# Patient Record
Sex: Male | Born: 1945 | Race: Black or African American | Hispanic: No | Marital: Married | Smoking: Former smoker
Health system: Southern US, Community
[De-identification: ages and names within clinical notes are randomized; demographics above are authoritative.]

## PROBLEM LIST (undated history)

## (undated) DIAGNOSIS — G709 Myoneural disorder, unspecified: Secondary | ICD-10-CM

## (undated) DIAGNOSIS — K5900 Constipation, unspecified: Secondary | ICD-10-CM

## (undated) DIAGNOSIS — Z860100 Personal history of colon polyps, unspecified: Secondary | ICD-10-CM

## (undated) DIAGNOSIS — K219 Gastro-esophageal reflux disease without esophagitis: Secondary | ICD-10-CM

## (undated) DIAGNOSIS — E7800 Pure hypercholesterolemia, unspecified: Secondary | ICD-10-CM

## (undated) DIAGNOSIS — K2270 Barrett's esophagus without dysplasia: Secondary | ICD-10-CM

## (undated) DIAGNOSIS — M1990 Unspecified osteoarthritis, unspecified site: Secondary | ICD-10-CM

## (undated) DIAGNOSIS — D649 Anemia, unspecified: Secondary | ICD-10-CM

## (undated) DIAGNOSIS — E039 Hypothyroidism, unspecified: Secondary | ICD-10-CM

## (undated) DIAGNOSIS — I10 Essential (primary) hypertension: Secondary | ICD-10-CM

## (undated) DIAGNOSIS — Z8489 Family history of other specified conditions: Secondary | ICD-10-CM

## (undated) DIAGNOSIS — J449 Chronic obstructive pulmonary disease, unspecified: Secondary | ICD-10-CM

## (undated) DIAGNOSIS — Z8719 Personal history of other diseases of the digestive system: Secondary | ICD-10-CM

## (undated) DIAGNOSIS — Z5189 Encounter for other specified aftercare: Secondary | ICD-10-CM

## (undated) HISTORY — PX: FOOT SURGERY: SHX648

## (undated) HISTORY — DX: Personal history of colon polyps, unspecified: Z86.0100

## (undated) HISTORY — PX: COLONOSCOPY: SHX5424

## (undated) HISTORY — PX: UPPER GASTROINTESTINAL ENDOSCOPY: SHX188

## (undated) HISTORY — DX: Encounter for other specified aftercare: Z51.89

## (undated) HISTORY — DX: Barrett's esophagus without dysplasia: K22.70

## (undated) HISTORY — DX: Anemia, unspecified: D64.9

## (undated) HISTORY — DX: Personal history of colonic polyps: Z86.010

## (undated) HISTORY — DX: Unspecified osteoarthritis, unspecified site: M19.90

## (undated) HISTORY — PX: ANTERIOR CERVICAL DECOMP/DISCECTOMY FUSION: SHX1161

## (undated) HISTORY — DX: Essential (primary) hypertension: I10

---

## 1983-04-27 HISTORY — PX: LUMBAR DISC SURGERY: SHX700

## 1999-09-03 ENCOUNTER — Encounter (INDEPENDENT_AMBULATORY_CARE_PROVIDER_SITE_OTHER): Payer: Self-pay | Admitting: Specialist

## 1999-09-03 ENCOUNTER — Other Ambulatory Visit: Admission: RE | Admit: 1999-09-03 | Discharge: 1999-09-03 | Payer: Self-pay | Admitting: Gastroenterology

## 1999-12-22 ENCOUNTER — Encounter: Payer: Self-pay | Admitting: Neurological Surgery

## 1999-12-22 ENCOUNTER — Ambulatory Visit (HOSPITAL_COMMUNITY): Admission: RE | Admit: 1999-12-22 | Discharge: 1999-12-22 | Payer: Self-pay | Admitting: Neurological Surgery

## 2000-12-26 ENCOUNTER — Emergency Department (HOSPITAL_COMMUNITY): Admission: EM | Admit: 2000-12-26 | Discharge: 2000-12-27 | Payer: Self-pay

## 2000-12-27 ENCOUNTER — Encounter: Payer: Self-pay | Admitting: Emergency Medicine

## 2001-01-30 ENCOUNTER — Ambulatory Visit (HOSPITAL_COMMUNITY): Admission: RE | Admit: 2001-01-30 | Discharge: 2001-01-30 | Payer: Self-pay | Admitting: *Deleted

## 2001-01-30 ENCOUNTER — Encounter (INDEPENDENT_AMBULATORY_CARE_PROVIDER_SITE_OTHER): Payer: Self-pay

## 2001-11-23 ENCOUNTER — Ambulatory Visit (HOSPITAL_COMMUNITY): Admission: RE | Admit: 2001-11-23 | Discharge: 2001-11-23 | Payer: Self-pay | Admitting: Cardiology

## 2001-11-23 ENCOUNTER — Encounter: Payer: Self-pay | Admitting: Cardiology

## 2002-07-31 ENCOUNTER — Ambulatory Visit (HOSPITAL_COMMUNITY): Admission: RE | Admit: 2002-07-31 | Discharge: 2002-07-31 | Payer: Self-pay | Admitting: *Deleted

## 2002-07-31 ENCOUNTER — Encounter (INDEPENDENT_AMBULATORY_CARE_PROVIDER_SITE_OTHER): Payer: Self-pay | Admitting: Specialist

## 2004-07-21 ENCOUNTER — Ambulatory Visit (HOSPITAL_COMMUNITY): Admission: RE | Admit: 2004-07-21 | Discharge: 2004-07-21 | Payer: Self-pay | Admitting: *Deleted

## 2004-07-21 ENCOUNTER — Encounter (INDEPENDENT_AMBULATORY_CARE_PROVIDER_SITE_OTHER): Payer: Self-pay | Admitting: *Deleted

## 2005-03-17 ENCOUNTER — Ambulatory Visit: Payer: Self-pay | Admitting: Internal Medicine

## 2005-06-18 ENCOUNTER — Ambulatory Visit: Payer: Self-pay | Admitting: Internal Medicine

## 2006-04-29 ENCOUNTER — Encounter (INDEPENDENT_AMBULATORY_CARE_PROVIDER_SITE_OTHER): Payer: Self-pay | Admitting: *Deleted

## 2006-04-29 ENCOUNTER — Ambulatory Visit: Payer: Self-pay | Admitting: Internal Medicine

## 2006-04-29 LAB — CONVERTED CEMR LAB
AST: 33 units/L (ref 0–37)
BUN: 8 mg/dL (ref 6–23)

## 2006-05-03 ENCOUNTER — Encounter: Payer: Self-pay | Admitting: Internal Medicine

## 2006-05-13 ENCOUNTER — Ambulatory Visit: Payer: Self-pay | Admitting: Internal Medicine

## 2006-07-28 ENCOUNTER — Ambulatory Visit: Payer: Self-pay | Admitting: Internal Medicine

## 2006-07-28 ENCOUNTER — Encounter: Admission: RE | Admit: 2006-07-28 | Discharge: 2006-07-28 | Payer: Self-pay | Admitting: Internal Medicine

## 2006-08-03 ENCOUNTER — Ambulatory Visit: Payer: Self-pay | Admitting: Internal Medicine

## 2006-08-15 ENCOUNTER — Ambulatory Visit: Payer: Self-pay | Admitting: Internal Medicine

## 2006-08-22 ENCOUNTER — Ambulatory Visit: Payer: Self-pay | Admitting: Internal Medicine

## 2006-10-13 ENCOUNTER — Encounter: Payer: Self-pay | Admitting: Internal Medicine

## 2007-05-22 ENCOUNTER — Encounter (INDEPENDENT_AMBULATORY_CARE_PROVIDER_SITE_OTHER): Payer: Self-pay | Admitting: *Deleted

## 2007-05-22 DIAGNOSIS — J00 Acute nasopharyngitis [common cold]: Secondary | ICD-10-CM | POA: Insufficient documentation

## 2007-05-22 DIAGNOSIS — E785 Hyperlipidemia, unspecified: Secondary | ICD-10-CM | POA: Insufficient documentation

## 2007-05-22 DIAGNOSIS — K2270 Barrett's esophagus without dysplasia: Secondary | ICD-10-CM | POA: Insufficient documentation

## 2007-10-19 ENCOUNTER — Ambulatory Visit: Payer: Self-pay | Admitting: Internal Medicine

## 2007-10-19 DIAGNOSIS — R3911 Hesitancy of micturition: Secondary | ICD-10-CM | POA: Insufficient documentation

## 2007-10-19 DIAGNOSIS — I10 Essential (primary) hypertension: Secondary | ICD-10-CM | POA: Insufficient documentation

## 2007-10-21 LAB — CONVERTED CEMR LAB
HDL: 44.3 mg/dL (ref >39.0)
Hgb A1c MFr Bld: 5.7 % (ref 4.6–6.0)
Triglycerides: 109 mg/dL (ref 0–149)

## 2007-10-25 ENCOUNTER — Encounter (INDEPENDENT_AMBULATORY_CARE_PROVIDER_SITE_OTHER): Payer: Self-pay | Admitting: *Deleted

## 2007-10-31 ENCOUNTER — Encounter (INDEPENDENT_AMBULATORY_CARE_PROVIDER_SITE_OTHER): Payer: Self-pay | Admitting: *Deleted

## 2007-11-01 ENCOUNTER — Encounter (INDEPENDENT_AMBULATORY_CARE_PROVIDER_SITE_OTHER): Payer: Self-pay | Admitting: *Deleted

## 2007-11-16 ENCOUNTER — Encounter: Payer: Self-pay | Admitting: Internal Medicine

## 2007-12-05 ENCOUNTER — Encounter: Admission: RE | Admit: 2007-12-05 | Discharge: 2007-12-05 | Payer: Self-pay | Admitting: Orthopaedic Surgery

## 2007-12-14 ENCOUNTER — Encounter (INDEPENDENT_AMBULATORY_CARE_PROVIDER_SITE_OTHER): Payer: Self-pay | Admitting: *Deleted

## 2007-12-14 ENCOUNTER — Ambulatory Visit (HOSPITAL_COMMUNITY): Admission: RE | Admit: 2007-12-14 | Discharge: 2007-12-14 | Payer: Self-pay | Admitting: *Deleted

## 2008-01-09 ENCOUNTER — Encounter: Payer: Self-pay | Admitting: Internal Medicine

## 2008-09-27 ENCOUNTER — Telehealth (INDEPENDENT_AMBULATORY_CARE_PROVIDER_SITE_OTHER): Payer: Self-pay | Admitting: *Deleted

## 2008-10-01 ENCOUNTER — Ambulatory Visit: Payer: Self-pay | Admitting: Internal Medicine

## 2008-10-01 DIAGNOSIS — D126 Benign neoplasm of colon, unspecified: Secondary | ICD-10-CM | POA: Insufficient documentation

## 2008-10-02 ENCOUNTER — Encounter: Payer: Self-pay | Admitting: Internal Medicine

## 2008-10-03 ENCOUNTER — Ambulatory Visit: Payer: Self-pay | Admitting: Internal Medicine

## 2008-10-04 ENCOUNTER — Telehealth (INDEPENDENT_AMBULATORY_CARE_PROVIDER_SITE_OTHER): Payer: Self-pay | Admitting: *Deleted

## 2008-10-04 LAB — CONVERTED CEMR LAB
Albumin: 3.6 g/dL (ref 3.5–5.2)
BUN: 14 mg/dL (ref 6–23)
Basophils Absolute: 0 10*3/uL (ref 0.0–0.1)
Cholesterol: 170 mg/dL (ref 0–200)
Creatinine, Ser: 1.1 mg/dL (ref 0.4–1.5)
Eosinophils Relative: 3.6 % (ref 0.0–5.0)
HCT: 42.9 % (ref 39.0–52.0)
Hemoglobin: 14.6 g/dL (ref 13.0–17.0)
LDL Cholesterol: 107 mg/dL — ABNORMAL HIGH (ref 0–99)
Lymphs Abs: 1.9 10*3/uL (ref 0.7–4.0)
MCV: 94.3 fL (ref 78.0–100.0)
Monocytes Absolute: 0.6 10*3/uL (ref 0.1–1.0)
Monocytes Relative: 11.5 % (ref 3.0–12.0)
Neutro Abs: 2.5 10*3/uL (ref 1.4–7.7)
PSA: 2.1 ng/mL (ref 0.10–4.00)
Platelets: 158 10*3/uL (ref 150.0–400.0)
Potassium: 4 meq/L (ref 3.5–5.1)
RDW: 13.3 % (ref 11.5–14.6)
Total Bilirubin: 0.8 mg/dL (ref 0.3–1.2)
Triglycerides: 95 mg/dL (ref 0.0–149.0)
VLDL: 19 mg/dL (ref 0.0–40.0)

## 2008-10-07 ENCOUNTER — Encounter (INDEPENDENT_AMBULATORY_CARE_PROVIDER_SITE_OTHER): Payer: Self-pay | Admitting: *Deleted

## 2009-03-13 ENCOUNTER — Ambulatory Visit: Payer: Self-pay | Admitting: Internal Medicine

## 2009-03-17 ENCOUNTER — Encounter: Payer: Self-pay | Admitting: Internal Medicine

## 2009-11-07 ENCOUNTER — Ambulatory Visit: Payer: Self-pay | Admitting: Internal Medicine

## 2009-11-10 LAB — CONVERTED CEMR LAB
Basophils Absolute: 0.1 10*3/uL (ref 0.0–0.1)
CO2: 31 meq/L (ref 19–32)
Calcium: 9.1 mg/dL (ref 8.4–10.5)
Creatinine, Ser: 0.9 mg/dL (ref 0.4–1.5)
Eosinophils Absolute: 0.2 10*3/uL (ref 0.0–0.7)
GFR calc non Af Amer: 103.92 mL/min (ref >60)
HCT: 44.8 % (ref 39.0–52.0)
Hemoglobin: 15.1 g/dL (ref 13.0–17.0)
Lymphs Abs: 1.6 10*3/uL (ref 0.7–4.0)
MCHC: 33.7 g/dL (ref 30.0–36.0)
Monocytes Relative: 10.3 % (ref 3.0–12.0)
Neutro Abs: 3.4 10*3/uL (ref 1.4–7.7)
Platelets: 188 10*3/uL (ref 150.0–400.0)
RDW: 14.9 % — ABNORMAL HIGH (ref 11.5–14.6)
Sodium: 137 meq/L (ref 135–145)

## 2009-11-20 LAB — CONVERTED CEMR LAB: Hgb A1c MFr Bld: 5.7 % (ref 4.6–6.5)

## 2010-05-24 LAB — CONVERTED CEMR LAB
ALT: 18 units/L (ref 0–53)
AST: 31 units/L (ref 0–37)
Albumin: 3.8 g/dL (ref 3.5–5.2)
Alkaline Phosphatase: 48 units/L (ref 39–117)
BUN: 11 mg/dL (ref 6–23)
Basophils Absolute: 0 10*3/uL (ref 0.0–0.1)
Basophils Relative: 0.4 % (ref 0.0–1.0)
Bilirubin, Direct: 0.2 mg/dL (ref 0.0–0.3)
CO2: 29 meq/L (ref 19–32)
Calcium: 9.4 mg/dL (ref 8.4–10.5)
Chloride: 103 meq/L (ref 96–112)
Cholesterol: 171 mg/dL (ref 0–200)
Creatinine, Ser: 1 mg/dL (ref 0.4–1.5)
Eosinophils Absolute: 0.1 10*3/uL (ref 0.0–0.7)
Eosinophils Relative: 2 % (ref 0.0–5.0)
GFR calc Af Amer: 96 mL/min
GFR calc non Af Amer: 80 mL/min
Glucose, Bld: 102 mg/dL — ABNORMAL HIGH (ref 70–99)
HCT: 44.3 % (ref 39.0–52.0)
HDL goal, serum: 40 mg/dL
HDL: 40.7 mg/dL (ref >39.0)
Hemoglobin: 15.3 g/dL (ref 13.0–17.0)
LDL Cholesterol: 106 mg/dL — ABNORMAL HIGH (ref 0–99)
LDL Goal: 90 mg/dL
Lymphocytes Relative: 29.8 % (ref 12.0–46.0)
MCHC: 34.7 g/dL (ref 30.0–36.0)
MCV: 93.5 fL (ref 78.0–100.0)
Monocytes Absolute: 0.3 10*3/uL (ref 0.1–1.0)
Monocytes Relative: 5.5 % (ref 3.0–12.0)
Neutro Abs: 3.3 10*3/uL (ref 1.4–7.7)
Neutrophils Relative %: 62.3 % (ref 43.0–77.0)
PSA: 1.43 ng/mL (ref 0.10–4.00)
Platelets: 194 10*3/uL (ref 150–400)
Potassium: 3.6 meq/L (ref 3.5–5.1)
RBC: 4.73 M/uL (ref 4.22–5.81)
RDW: 13.2 % (ref 11.5–14.6)
Sodium: 141 meq/L (ref 135–145)
TSH: 0.49 microintl units/mL (ref 0.35–5.50)
Total Bilirubin: 1.2 mg/dL (ref 0.3–1.2)
Total CHOL/HDL Ratio: 4.2
Total Protein: 6.9 g/dL (ref 6.0–8.3)
Triglycerides: 120 mg/dL (ref 0–149)
VLDL: 24 mg/dL (ref 0–40)
WBC: 5.3 10*3/uL (ref 4.5–10.5)

## 2010-05-28 NOTE — Assessment & Plan Note (Signed)
Summary: cough//kn   Vital Signs:  Patient profile:   65 year old male Weight:      166.6 pounds BMI:     25.05 Temp:     97.7 degrees F oral Pulse rate:   76 / minute Resp:     15 per minute BP sitting:   120 / 82  (left arm) Cuff size:   large  Vitals Entered By: Shonna Chock CMA (November 07, 2009 10:33 AM) CC: Cough x 3 weeks, yesterday patient noticed yellow drainage Comments REVIEWED MED LIST, PATIENT AGREED DOSE AND INSTRUCTION CORRECT    CC:  Cough x 3 weeks and yesterday patient noticed yellow drainage.  History of Present Illness: Cough      This is a 65 year old man who presents with Cough for 3 months.  The patient reports both dry & productive cough and wheezing, but denies pleuritic chest pain, shortness of breath, exertional dyspnea, fever, hemoptysis, and malaise.  Associated symtpoms include  occasional nasal congestion and  occasional peripheral edema.  The patient denies the following symptoms: cold/URI symptoms, sore throat, chronic rhinitis, weight loss, and acid reflux symptoms.  The cough is worse with activity after he arises.  Ineffective prior treatments have included remainder of Rx  cough medication.  Risk factors include history of allergic rhinitis and history of reflux.    Allergies: 1)  ! Norvasc 2)  ! * Labetolol  Review of Systems General:  Denies chills, fever, and sweats. ENT:  Complains of hoarseness; denies difficulty swallowing; No frontal headache , facial pain or purulence. Resp:  Some yellow sputum. GI:  Denies bloody stools, dark tarry stools, and indigestion. Allergy:  Denies itching eyes and sneezing; Watery eyse.  Physical Exam  General:  Thin but well-nourished,in no acute distress; alert,appropriate and cooperative throughout examination Ears:  External ear exam shows no significant lesions or deformities.  Otoscopic examination reveals clear canals, tympanic membranes are intact bilaterally without bulging, retraction, inflammation  or discharge. Hearing is grossly normal bilaterally. Nose:  External nasal examination shows no deformity or inflammation. Nasal mucosa are pink and moist without lesions or exudates. Mouth:  Oral mucosa and oropharynx without lesions or exudates.  Teeth in good repair. No pharyngeal erythema.   Lungs:  Normal respiratory effort, chest expands symmetrically. Lungs are clear to auscultation, no crackles or wheezes but BS decreased Heart:  Normal rate and regular rhythm. S1 and S2 normal without gallop, murmur, click, rub or other extra sounds. Abdomen:  Bowel sounds positive,abdomen soft and non-tender without masses, organomegaly or hernias noted. Extremities:  No clubbing, cyanosis, edema Cervical Nodes:  No lymphadenopathy noted Axillary Nodes:  No palpable lymphadenopathy Psych:  memory intact for recent and remote, normally interactive, and good eye contact.     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 1 by mouth qd His updated medication list for this problem includes:    Clonidine Hcl 0.3 Mg Tabs (Clonidine hcl) .Marland Kitchen... 1 by  mouth bid    Losartan Potassium-hctz 100-12.5 Mg Tabs (Losartan potassium-hctz) .Marland Kitchen... 1 once daily  Orders: TLB-BMP (Basic Metabolic Panel-BMET) (80048-METABOL)  Complete Medication List: 1)  Clonidine Hcl 0.3 Mg Tabs (Clonidine hcl) .Marland Kitchen.. 1 by mouth bid 2)  Simvastatin  40 Mg Tabs (Simvastatin) .... Not taking 3)  Asa  .Marland Kitchen.. 1 tablet by mouth daily as directed 4)  Vitamin E  .... As directed 5)  Vitamin C  .... As directed 6)  Amoxicillin 500 Mg Caps (Amoxicillin) .Marland Kitchen.. 1 three times a day 7)  Promethazine Vc/codeine 6.25-5-10 Mg/6ml Syrp (Phenyleph-promethazine-cod) .Marland Kitchen.. 1 tsp every 6 hrs as needed 8)  Losartan Potassium-hctz 100-12.5 Mg Tabs (Losartan potassium-hctz) .Marland Kitchen.. 1 once daily  Patient Instructions: 1)  Neti Pot once daily as needed for sinus congestion.Drink as much fluid as you can tolerate for the next few days.Check your Blood Pressure regularly. If it is above: 140/90 ON AVERAGE  you should make an appointment. Prescriptions: LOSARTAN POTASSIUM-HCTZ 100-12.5 MG TABS (LOSARTAN POTASSIUM-HCTZ) 1 once daily  #30 x 5   Entered and Authorized by:   Marga Melnick MD   Signed by:   Marga Melnick MD on 11/07/2009   Method used:   Faxed to ...       [id number] ([facility])       [address]       [place], [place]  [address]       Ph: [phone]       Fax: [phone]   RxID:   [id number] CLONIDINE HCL 0.3 MG  TABS (CLONIDINE HCL) 1 by mouth bid  #180 Tablet x 3   Entered and Authorized by:   Marga Melnick MD   Signed by:   Marga Melnick MD on 11/07/2009   Method used:   Faxed to ...       [id number] ([facility]ail)       [address]       [place], [place]  [address]       Ph: [phone]       Fax: [phone]   RxID:   [id number] PROMETHAZINE VC/CODEINE 6.25-5-10 MG/5ML SYRP (PHENYLEPH-PROMETHAZINE-COD) 1 tsp every 6 hrs as needed  #120cc x 0   Entered and Authorized by:   Marga Melnick MD   Signed by:   Marga Melnick MD on 11/07/2009   Method used:   Printed then faxed to ...       [id number] ([facility]       [address]       [place], [place]  [address]       Ph: [phone]       Fax: [phone]   RxID:    [id number] AMOXICILLIN 500 MG CAPS (AMOXICILLIN) 1 three times a day  #30 x 0   Entered and Authorized by:   Marga Melnick MD   Signed by:   Marga Melnick MD on 11/07/2009   Method used:   Faxed to ...       [id number] ([facility]       [address]       [place], [place]  [address]       Ph: [phone]       Fax:  [phone]   RxID:   [id number]   Appended Document: cough//kn

## 2010-07-17 ENCOUNTER — Encounter: Payer: Self-pay | Admitting: Internal Medicine

## 2010-07-17 ENCOUNTER — Ambulatory Visit (INDEPENDENT_AMBULATORY_CARE_PROVIDER_SITE_OTHER): Payer: 59 | Admitting: Internal Medicine

## 2010-07-17 VITALS — BP 128/84 | HR 80 | Temp 98.7°F | Wt 168.4 lb

## 2010-07-17 DIAGNOSIS — J029 Acute pharyngitis, unspecified: Secondary | ICD-10-CM

## 2010-07-17 DIAGNOSIS — I10 Essential (primary) hypertension: Secondary | ICD-10-CM

## 2010-07-17 DIAGNOSIS — J209 Acute bronchitis, unspecified: Secondary | ICD-10-CM

## 2010-07-17 LAB — POCT RAPID STREP A (OFFICE): Rapid Strep A Screen: NEGATIVE

## 2010-07-17 MED ORDER — CLONIDINE HCL 0.3 MG PO TABS: 0.3000 mg | ORAL_TABLET | Freq: Two times a day (BID) | ORAL | Status: DC

## 2010-07-17 MED ORDER — HYDROCODONE-HOMATROPINE 5-1.5 MG/5ML PO SYRP: 5.0000 mL | ORAL_SOLUTION | Freq: Four times a day (QID) | ORAL | Status: DC | PRN

## 2010-07-17 MED ORDER — AZITHROMYCIN 250 MG PO TABS: 250.0000 mg | ORAL_TABLET | Freq: Every day | ORAL | Status: AC

## 2010-07-17 MED ORDER — LOSARTAN POTASSIUM-HCTZ 100-12.5 MG PO TABS: 1.0000 | ORAL_TABLET | Freq: Every day | ORAL | Status: DC

## 2010-07-17 NOTE — Progress Notes (Signed)
  Subjective:    Patient ID: Curtis Vance, male    DOB: 11/17/1943, 65 y.o.   MRN: [id number]  HPI   Symptoms began approximately 2-1/2 weeks ago with a nonproductive cough. He's been using an over-the-counter cough syrup and Alka-Seltzer Cold & Cough.    his had a sore throat for 2 and half days. He also reports fever &  a productive cough with yellow / green sputum & associated low-grade wheezing and shortness of breath.    he denies frontal headache , facial pain, nasal purulence, dental pain, or enlarged lymph nodes.     he is also requested his blood pressure medicines be refilled. He denies persistent lightheadedness, chest pain, palpitations, or edema. He has noted some swelling when he feels the blood pressure is uncontrolled. Blood pressure has been measured as high as 160-180/100. The chart was reviewed; he has not had renal function checked since July, 2011. He is on a diuretic.    Review of Systems     Objective:   Physical Exam   On exam he is in no acute distress ; his eyes have normal extraocular motion and there is no discharge or scleral icterus.  Nares are dry without exudate. Dental hygiene is good. There is no significant erythema the oropharynx.    the otic canals are clear and there is no evidence of erythema of the tympanic membranes.    chest is essentially clear with exception of faint isolated wheezing , this was most prominent in the right mid posterior chest.    he exhibits an S4 with a slight gallop ; there are no murmurs or rubs. All pulses are intact ; no bruits are present. He has no pedal edema at this time.        Assessment & Plan:   #1 pharyngitis; neg Rapid Strep  #2 bronchitis with purulent secretions and some reactive airway component.   #3 hypertension. Some of his home measurements suggest poorly controlled blood pressure , but the blood pressure today is excellent.

## 2010-07-17 NOTE — Patient Instructions (Signed)
Drink as much nondairy fluids she can over the next few days. Take the antibiotics and cough syrup as prescribed.   Please monitor your blood pressure several times a week. Your goal is of average less than 140/90 , ideally less than 135/85. Please take your blood pressure cuff to all visits with physicians. Also check your blood pressure cuff against the cuff at the pharmacy periodically. I'm concerned that  Your cuff may be measuring  Higher than actual value .

## 2010-07-17 NOTE — Progress Notes (Signed)
Addended by: Floydene Flock on: 07/17/2010 04:18 PM   Modules accepted: Orders

## 2010-07-24 ENCOUNTER — Ambulatory Visit (INDEPENDENT_AMBULATORY_CARE_PROVIDER_SITE_OTHER)
Admission: RE | Admit: 2010-07-24 | Discharge: 2010-07-24 | Disposition: A | Discharge status: Home / Self Care | Payer: 59 | Source: Ambulatory Visit | Attending: Internal Medicine | Admitting: Internal Medicine

## 2010-07-24 ENCOUNTER — Ambulatory Visit (INDEPENDENT_AMBULATORY_CARE_PROVIDER_SITE_OTHER): Payer: 59 | Admitting: Internal Medicine

## 2010-07-24 VITALS — BP 122/80 | HR 78 | Temp 97.9°F | Wt 161.4 lb

## 2010-07-24 DIAGNOSIS — J40 Bronchitis, not specified as acute or chronic: Secondary | ICD-10-CM

## 2010-07-24 DIAGNOSIS — J45909 Unspecified asthma, uncomplicated: Secondary | ICD-10-CM

## 2010-07-24 MED ORDER — HYDROCODONE-HOMATROPINE 5-1.5 MG/5ML PO SYRP: 5.0000 mL | ORAL_SOLUTION | Freq: Four times a day (QID) | ORAL | Status: AC | PRN

## 2010-07-24 MED ORDER — PREDNISONE 20 MG PO TABS: 20.0000 mg | ORAL_TABLET | Freq: Every day | ORAL | Status: DC

## 2010-07-24 MED ORDER — AMOXICILLIN-POT CLAVULANATE 875-125 MG PO TABS: 1.0000 | ORAL_TABLET | Freq: Two times a day (BID) | ORAL | Status: AC

## 2010-07-24 MED ORDER — MOMETASONE FURO-FORMOTEROL FUM 200-5 MCG/ACT IN AERO: 2.0000 | INHALATION_SPRAY | Freq: Two times a day (BID) | RESPIRATORY_TRACT | Status: AC

## 2010-07-24 NOTE — Progress Notes (Signed)
  Subjective:    Patient ID: Curtis Vance, male    DOB: 11/17/1943, 65 y.o.   MRN: [id number]  Cough The current episode started 1 to 4 weeks ago. The problem has been gradually worsening. The problem occurs every few minutes. The cough is productive of purulent sputum. Associated symptoms include chills, a fever, postnasal drip, shortness of breath and wheezing. Pertinent negatives include no chest pain, ear congestion, ear pain, headaches, nasal congestion or sore throat. The symptoms are aggravated by lying down. Risk factors: Quit smoking > 10 years ago; no PMH of asthma. He has tried prescription cough suppressant for the symptoms. The treatment provided no relief. There is no history of emphysema.      Review of Systems  Constitutional: Positive for fever and chills.  HENT: Positive for postnasal drip. Negative for ear pain and sore throat.   Respiratory: Positive for cough, shortness of breath and wheezing.   Cardiovascular: Negative for chest pain.  Neurological: Negative for headaches.       Objective:   Physical Exam  Constitutional: No distress.  HENT:  Left Ear: External ear normal.  Mouth/Throat: No oropharyngeal exudate.  Eyes: Right eye exhibits no discharge. Left eye exhibits no discharge. No scleral icterus.  Cardiovascular: Normal rate and regular rhythm.   No murmur heard. Pulmonary/Chest: Effort normal. No respiratory distress. He has wheezes. He has no rales.       Diffuse, homogenous, musical  rhonchi & wheezing  Lymphadenopathy:    He has no cervical adenopathy.  Neurological: He is alert.  Skin: Skin is dry. He is not diaphoretic. No erythema.  Psychiatric: He has a normal mood and affect.          Assessment & Plan:

## 2010-07-24 NOTE — Patient Instructions (Signed)
  Gargle & spit after using Dulera . Xray @  [employer] [facility] [employer].drink as much non dairy fluids as possible

## 2010-07-27 ENCOUNTER — Other Ambulatory Visit: Payer: Self-pay | Admitting: Internal Medicine

## 2010-07-27 ENCOUNTER — Ambulatory Visit: Payer: 59 | Admitting: Internal Medicine

## 2010-07-27 ENCOUNTER — Telehealth: Payer: Self-pay | Admitting: *Deleted

## 2010-07-27 NOTE — Telephone Encounter (Signed)
Refill prednisone 20 mg 1 twice a day with meals dispense #14

## 2010-07-27 NOTE — Telephone Encounter (Signed)
#  14; 1 bid with a meal

## 2010-07-27 NOTE — Telephone Encounter (Signed)
Dr.Hopper please advise on med refill for Prednisone

## 2010-07-27 NOTE — Telephone Encounter (Signed)
rx sent to pharmacy

## 2010-07-27 NOTE — Telephone Encounter (Signed)
See duplicate Rx

## 2010-07-28 NOTE — Telephone Encounter (Signed)
Duplicate mesage

## 2010-09-08 NOTE — Op Note (Signed)
Curtis Vance, Curtis Vance              ACCOUNT NO.:  [id number]   MEDICAL RECORD NO.:  [id number]          PATIENT TYPE:  AMB   LOCATION:  ENDO                         FACILITY[facility]  [facility]   PHYSICIAN:  Georgiana Spinner, M.D.    DATE OF BIRTH:  11/17/1943   DATE OF PROCEDURE:  12/14/2007  DATE OF DISCHARGE:                               OPERATIVE REPORT   PROCEDURE:  Upper endoscopy with biopsy.   INDICATIONS:  Gastroesophageal reflux disease.   ANESTHESIA:  Demerol 70 mg, Versed 7.5 mg.   PROCEDURE IN DETAIL:  With the patient mildly sedated in the left  lateral decubitus position the Pentax videoscopic endoscope was inserted  in the mouth, passed under direct vision through the esophagus which  appeared normal except when it reached the distal esophagus there were  clear-cut changes of Barrett's, photographed and biopsies taken.  We  entered into the stomach, fundus, body appeared normal.  Antrum showed a  nodularity just to the posterior side of the pylorus which was  photographed and it too was biopsied separately.  Duodenal bulb, second  portion of duodenum appeared normal.  From this point the endoscope was  slowly withdrawn taking circumferential views of duodenal mucosa until  the endoscope had been pulled back into the stomach and placed in  retroflexion to view the stomach from below.  The endoscope was  straightened and withdrawn taking circumferential views of remaining  gastric and esophageal mucosa.  The patient's vital signs, pulse  oximeter remained stable.  The patient tolerated the procedure well  without apparent complications.   FINDINGS:  Changes of Barrett's esophagus and nodularity of the antrum.  Await biopsy reports.  The patient will call me for results and follow  up with me as an outpatient.  Proceed to colonoscopy as planned           ______________________________  Georgiana Spinner, M.D.     GMO/MEDQ  D:  12/14/2007  T:  12/14/2007  Job:  [id number]

## 2010-09-08 NOTE — Op Note (Signed)
Curtis Vance, Curtis Vance              ACCOUNT NO.:  [id number]   MEDICAL RECORD NO.:  [id number]          PATIENT TYPE:  AMB   LOCATION:  ENDO                         [facility]ACILITY:  [facility]   PHYSICIAN:  Georgiana Spinner, M.D.    DATE OF BIRTH:  11/17/1943   DATE OF PROCEDURE:  DATE OF DISCHARGE:                               OPERATIVE REPORT   PROCEDURE:  Colonoscopy.   INDICATIONS:  Colon cancer screening.  Colon polyps.   ANESTHESIA:  None further given.   PROCEDURE:  With the patient mildly sedated in the left lateral  decubitus position, a rectal exam was performed which is unremarkable.  Prostate felt normal to me, except slightly boggy, but no masses noted  on my limited exam.  Subsequently the Pentax videoscopic colonoscope was  inserted into the rectum and passed under direct vision to the cecum,  identified by ileocecal valve and appendiceal orifice, both of which  were photographed.  From this point the colonoscope was slowly  withdrawn, taking circumferential views of the colonic mucosa, stopping  only to suction yellowish, watery material from the sigmoid colon which  also contained corn kernels which limited our ability to suction, but no  gross lesions were seen as we withdrew all the way to the rectum which  appeared normal on direct and showed hemorrhoids on the retroflexed  view. The endoscope was straightened and withdrawn.  The patient's vital  signs, pulse oximeter remained stable.  The patient tolerated the  procedure well, without apparent complications.   FINDINGS:  Internal hemorrhoids, otherwise an unremarkable exam.   PLAN:  Consider repeat examination in 5-10 years           ______________________________  Georgiana Spinner, M.D.     GMO/MEDQ  D:  12/14/2007  T:  12/14/2007  Job:  [id number]   cc:   [name], MD,FACP,FCCP  [phone] [place]  [place] [address]

## 2010-09-11 NOTE — Op Note (Signed)
   NAMESREEKAR, BROYHILL                          ACCOUNT NO.:  [id number]   MEDICAL RECORD NO.:  [id number]                   PATIENT TYPE:  AMB   LOCATION:  ENDO                                 FACILITY:  [facility]   PHYSICIAN:  Georgiana Spinner, M.D.                 DATE OF BIRTH:  11-02-1945   DATE OF PROCEDURE:  07/31/2002  DATE OF DISCHARGE:                                 OPERATIVE REPORT   PROCEDURE PERFORMED:  Upper endoscopy with biopsy.   ENDOSCOPIST:  Georgiana Spinner, M.D.   INDICATIONS FOR PROCEDURE:  Gastroesophageal reflux disease, Barrett's  esophagus.   ANESTHESIA:  Demerol 50 mg, Versed 5 mg.   DESCRIPTION OF PROCEDURE:  With the patient mildly sedated in the left  lateral decubitus position, the Olympus video endoscope was inserted in the  mouth and passed under direct vision through the esophagus which appeared  normal.  In the distal esophagus we could never get it to fully open in  order to demonstrate truly whether Barrett's esophagus was present, but the  mucosal border between esophagus and stomach was photographed and multiple  biopsies in this area were taken.  We entered into the stomach  the fundus,  body, antrum, duodenal bulb and second portion of the duodenum all appeared  normal.  From this point, the endoscope was slowly withdrawn taking  circumferential views of the entire duodenal mucosa until the endoscope was  pulled back into the stomach and placed on retroflexion to view the stomach  from below and a loose wrap of the gastroesophageal junction around the  endoscope was noted and photographed.  The endoscope was then straightened  and withdrawn taking circumferential views of the remaining gastric and  esophageal mucosa.  The patient's vital signs and pulse oximeter remained  stable.  The patient tolerated the procedure well without apparent  complications.   FINDINGS:  Biopsies of distal esophagus,  lax wrap of the gastroesophageal  junction around  the endoscope.   PLAN:  Await biopsy report.  Patient will call me for results and follow up  with me as an outpatient.                                               Georgiana Spinner, M.D.    GMO/MEDQ  D:  07/31/2002  T:  07/31/2002  Job:  [id number]

## 2010-09-11 NOTE — Procedures (Signed)
[facility]  Patient:    Curtis Vance, Curtis Vance Visit Number: [id number] MRN: [id number]          Service Type: END Location: ENDO Attending Physician:  Sabino Gasser Proc. Date: 01/30/01 Admit Date:  01/30/2001   CC:         [name], M.D. [facility]   Procedure Report  PROCEDURE:  Upper endoscopy.  INDICATIONS:  Gastroesophageal reflux disease.  ANESTHESIA:  Demerol 50 mg, Versed 7 mg.  DESCRIPTION OF PROCEDURE:  With the patient mildly sedated in the left lateral decubitus position, the Olympus videoscopic endoscope was inserted into the mouth and passed under direct vision through the esophagus, which appeared normal until we reached the distal esophagus, and there was some erythema seen, questionable of whether it was just some mild esophagitis or a short segment of Barretts esophagus is not known, but we biopsied the area. Entered into the stomach.  Fundus and body appeared normal.  Antrum showed some mild erythematous change in the prepyloric area which were photographed and biopsied.  Duodenal bulb, second portion of the duodenum appeared normal. From this point, the endoscope was slowly withdrawn, taking circumferential views of the entire duodenal mucosa until the endoscope then pulled back in the stomach and placed in retroflexion to view the stomach from below, and a hiatal hernia was seen.  It is easy to see that there could be free reflux. The endoscope was then straightened and withdrawn, taking circumferential views of the remaining gastric and esophageal mucosa.  The patients vital signs and pulse oximeter remained stable.  The patient tolerated the procedure well without apparent complications.  FINDINGS: 1. Changes above a hiatal hernia.  Await biopsy report.  2. Erythema of the    antrum.  Await biopsy report. 3. The patient will call me for results and follow up with me as an    outpatient.  PLAN:  Proceed to  colonoscopy. Attending Physician:  Sabino Gasser DD:  01/30/01 TD:  01/30/01 Job: [id number]

## 2010-09-11 NOTE — Procedures (Signed)
[facility]  Patient:    Curtis Vance, Curtis Vance Visit Number: [id number] MRN: [id number]          Service Type: END Location: ENDO Attending Physician:  Sabino Gasser Proc. Date: 01/30/01 Admit Date:  01/30/2001   CC:         [name], M.D. [facility]   Procedure Report  PROCEDURE:  Colonoscopy.  ANESTHESIA:  Demerol 20, Versed 1 mg.  INDICATION:  Colon polyps in the past.  For follow-up.  DESCRIPTION OF PROCEDURE:  With the patient mildly sedated in the left lateral decubitus position, a rectal examination was performed which was unremarkable. Subsequently, the Olympus videoscopic colonoscope was inserted in the rectum and passed under direct vision to the cecum, identified by the ileocecal valve and appendiceal orifice.  From this point, the colonoscope was slowly withdrawn, taking circumferential views of the entire colonic mucosa, stopping in the descending colon where two small adjacent polyps were seen, photographed, and removed using using hot biopsy forceps technique setting of 20-20 blended current.  We withdrew the endoscope further until we reached the rectum which at 20 cm, showed a small polyp that once again was photographed, and the polyp was removed using hot biopsy forceps technique, again a setting of 20-20 blended current.  The endoscope was then placed in retroflexion to view the anal canal from above.  Small internal hemorrhoids were seen and photographed.  The endoscope was straightened and withdrawn.  The patients vital signs and pulse oximeter remained stable.  The patient tolerated the procedure well without apparent complications.  FINDINGS:  Small internal hemorrhoids.  Polyps of descending colon and rectum. Await biopsy report.  The patient will call me for results and follow up with me as an outpatient. Attending Physician:  Sabino Gasser DD:  01/30/01 TD:  01/30/01 Job: [id number]

## 2010-09-11 NOTE — Assessment & Plan Note (Signed)
[facility]                        [place] OFFICE NOTE   Curtis Vance, Curtis Vance                     MRN:          [id number]  DATE:08/22/2006                            DOB:          [birth year]    Curtis Vance was seen in followup August 22, 2006 to review his  cholesterol panel.  He is on no specific diet,but he is physically  active in his yard and around his home with no cardiopulmonary symptoms,  except those related to recent respiratory infection.   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 He was placed on pravastatin 40 mg  daily with a drop in the LDL to 90.  His LDL goal would be less than 75.   His cardiopulmonary exam reveals no abnormal breath sounds or murmurs.   He will be placed on simvastatin 40 with repeat lipids in 10 to 11 weeks  in an attempt to reach LDL goal.   Additionally, he has had soreness in the right chest with certain  movements.  He was seen on July 28, 2006 by Dr. Drue Novel.  He was given  samples of Avalox.  The chest x-ray did not reveal pneumonia at that  time.  He was seen in followup on April 9, after a total of 7 days of  Avalox.  Rare wheezing was noted.  He was placed on Qvar inhalations  every 12 hours and given  Avalox samples to complete a 10 day course.   He continues to have some yellow sputum in the morning.  He has no  lymphadenopathy about the head, neck or axilla.   PHYSICAL EXAMINATION:  CHEST:  Clear.  HEENT:  His otolaryngologic exam was unremarkable and he has no upper  respiratory tract symptoms of rhinosinusitis.  CHEST:  No rubs or rales are noted and there is no evidence of chest  wall tenderness to compression or palpation.   His right sided chest pain with motion is probably related to  osteochondral injury induced by cough.  He will be asked to continue the  Qvar every 12 hours.  If symptoms persist, then I will  have a repeat  chest x-ray done.  As stated the film on April 3 was negative.   Additionally, handicap sticker application was completed.     [name], MD,FACP,FCCP  Electronically Signed    [facility]/MedQ  DD: 08/22/2006  DT: 08/22/2006  Job #: [id number]

## 2010-09-11 NOTE — Op Note (Signed)
Curtis Vance, Curtis Vance              ACCOUNT NO.:  [id number]   MEDICAL RECORD NO.:  [id number]          PATIENT TYPE:  AMB   LOCATION:  ENDO                         FACILITY:  [facility]   PHYSICIAN:  Georgiana Spinner, M.D.    DATE OF BIRTH:  [birth year]   DATE OF PROCEDURE:  07/21/2004  DATE OF DISCHARGE:                                 OPERATIVE REPORT   PROCEDURE:  Upper endoscopy.   INDICATIONS FOR PROCEDURE:  GERD.   ANESTHESIA:  Demerol 50, Versed 5 mg.   DESCRIPTION OF PROCEDURE:  With the patient mildly sedated in the left  lateral decubitus position the videoscopic endoscope was inserted in the  mouth and passed under direct vision through the esophagus.  There was a  questionable area of Barrett's photographed and biopsied.  We entered into  the stomach, fundus, body, antrum and duodenal bulb and second portion of  the duodenum __________.  At this point the endoscope was slowly withdrawn  taking circumferential views of the duodenal mucosa until the endoscope had  been pulled back into the stomach, placed in retroflexion to view the  stomach from below.  The endoscope was straightened and withdrawn taking  circumferential views of remaining gastric and esophageal mucosa, stopping  in the body, fundus to photograph erythema seen there which was also  biopsied.  The endoscope was withdrawn.  The patient's vital signs and pulse  oximeter remained stable.  The patient tolerated the procedure well without  apparent complications.   FINDINGS:  Questionable area of Barrett's esophagus biopsied.  Erythema in  the body of the fundus and stomach biopsied.   Await biopsy report.  The patient will call me for results and follow up  with me as an outpatient.  Proceed to colonoscopy as planned.      GMO/MEDQ  D:  07/21/2004  T:  07/21/2004  Job:  [id number]

## 2010-09-11 NOTE — Op Note (Signed)
Curtis Vance, Curtis Vance              ACCOUNT NO.:  [id number]   MEDICAL RECORD NO.:  [id number]          PATIENT TYPE:  AMB   LOCATION:  ENDO                         FACILITY:  [facility]   PHYSICIAN:  Georgiana Spinner, M.D.    DATE OF BIRTH:  10/16/1945   DATE OF PROCEDURE:  07/21/2004  DATE OF DISCHARGE:                                 OPERATIVE REPORT   PROCEDURE:  Colonoscopy.   INDICATION:  History of polyp.   ANESTHESIA:  __________   PROCEDURE:  With the patient mildly sedated in the left lateral decubitus  position, __________.  Subsequently the Olympus videoscopic colonoscope was  passed into the rectum passed under direct vision to the cecum, identified  by ileocecal valve and the cecum.  The prep was substandard and solid  material, a thick liquidy material  __________ which rendered this a very  inadequate examination but no gross lesions were seen as we withdrew the  colonoscope taking circumferential views of the colonic mucosa as best we  could until we reached the rectum, which appeared normal on direct and  retroflexed views.  The  scope was straightened and withdrawn.  The  patient;s vital signs and pulse oximetry remained stable.  The patient  tolerated the procedure well without apparent complications.   FINDINGS:  This is a very limited examination due to prep, but no gross  lesions seen.   PLAN:  __________ may consider repeat colonoscopy examination.  Will discuss  this  with the patient family and patient, as an outpatient in the office.      GMO/MEDQ  D:  07/21/2004  T:  07/21/2004  Job:  [id number]   cc:   [name], M.D. [facility]

## 2010-12-11 ENCOUNTER — Telehealth: Payer: Self-pay | Admitting: Internal Medicine

## 2010-12-11 NOTE — Telephone Encounter (Signed)
Spoke w/ pt wife aware of information.

## 2010-12-11 NOTE — Telephone Encounter (Signed)
Patient wife glenda received lab result from [phone] but said dr hopper didn't address result - wants to know if patient needs follow up

## 2011-08-28 ENCOUNTER — Other Ambulatory Visit: Payer: Self-pay | Admitting: Internal Medicine

## 2011-08-28 DIAGNOSIS — I10 Essential (primary) hypertension: Secondary | ICD-10-CM

## 2011-08-30 NOTE — Telephone Encounter (Signed)
Patient needs to schedule a CPX  

## 2011-08-30 NOTE — Telephone Encounter (Signed)
Refill: Losartan-hctz 100-12.5mg  tab. Take 1 tablet by mouth daily. Qty 90. Last fill 05-29-11

## 2011-08-30 NOTE — Telephone Encounter (Signed)
Left message w/pt spouse for pt to return call to schedule appt w/Dr. Alwyn Ren.

## 2011-08-31 MED ORDER — LOSARTAN POTASSIUM-HCTZ 100-12.5 MG PO TABS: 1.0000 | ORAL_TABLET | Freq: Every day | ORAL | Status: DC

## 2011-08-31 NOTE — Telephone Encounter (Signed)
Pt is scheduled for 09/09/11

## 2011-08-31 NOTE — Telephone Encounter (Signed)
Pt is in need of Losartan.

## 2011-08-31 NOTE — Telephone Encounter (Signed)
Addended by: Maurice Small on: 08/31/2011 04:48 PM   Modules accepted: Orders

## 2011-09-09 ENCOUNTER — Encounter: Payer: 59 | Admitting: Internal Medicine

## 2011-10-07 ENCOUNTER — Other Ambulatory Visit: Payer: Self-pay | Admitting: Internal Medicine

## 2011-11-02 ENCOUNTER — Ambulatory Visit (INDEPENDENT_AMBULATORY_CARE_PROVIDER_SITE_OTHER): Payer: Medicare Other | Admitting: Internal Medicine

## 2011-11-02 ENCOUNTER — Encounter: Payer: Self-pay | Admitting: Internal Medicine

## 2011-11-02 VITALS — BP 116/74 | HR 81 | Temp 97.6°F | Resp 12 | Ht 68.98 in | Wt 164.6 lb

## 2011-11-02 DIAGNOSIS — Z860100 Personal history of colon polyps, unspecified: Secondary | ICD-10-CM

## 2011-11-02 DIAGNOSIS — I10 Essential (primary) hypertension: Secondary | ICD-10-CM

## 2011-11-02 DIAGNOSIS — E785 Hyperlipidemia, unspecified: Secondary | ICD-10-CM

## 2011-11-02 DIAGNOSIS — Z0000 Encounter for general adult medical examination without abnormal findings: Secondary | ICD-10-CM

## 2011-11-02 DIAGNOSIS — IMO0002 Reserved for concepts with insufficient information to code with codable children: Secondary | ICD-10-CM

## 2011-11-02 DIAGNOSIS — Z8719 Personal history of other diseases of the digestive system: Secondary | ICD-10-CM

## 2011-11-02 DIAGNOSIS — N398 Other specified disorders of urinary system: Secondary | ICD-10-CM

## 2011-11-02 LAB — CBC WITH DIFFERENTIAL/PLATELET
Basophils Absolute: 0.1 10*3/uL (ref 0.0–0.1)
Basophils Relative: 2.2 % (ref 0.0–3.0)
Eosinophils Absolute: 0.1 10*3/uL (ref 0.0–0.7)
Eosinophils Relative: 2.4 % (ref 0.0–5.0)
HCT: 40.3 % (ref 39.0–52.0)
Hemoglobin: 13.3 g/dL (ref 13.0–17.0)
Lymphocytes Relative: 34.5 % (ref 12.0–46.0)
Lymphs Abs: 1.7 10*3/uL (ref 0.7–4.0)
MCHC: 33.1 g/dL (ref 30.0–36.0)
MCV: 97.6 fl (ref 78.0–100.0)
Monocytes Absolute: 0.6 10*3/uL (ref 0.1–1.0)
Monocytes Relative: 12 % (ref 3.0–12.0)
Neutro Abs: 2.5 10*3/uL (ref 1.4–7.7)
Neutrophils Relative %: 48.9 % (ref 43.0–77.0)
Platelets: 191 10*3/uL (ref 150.0–400.0)
RBC: 4.13 Mil/uL — ABNORMAL LOW (ref 4.22–5.81)
RDW: 13.2 % (ref 11.5–14.6)
WBC: 5 10*3/uL (ref 4.5–10.5)

## 2011-11-02 LAB — HEPATIC FUNCTION PANEL
AST: 34 U/L (ref 0–37)
Alkaline Phosphatase: 52 U/L (ref 39–117)
Bilirubin, Direct: 0.1 mg/dL (ref 0.0–0.3)
Total Protein: 7 g/dL (ref 6.0–8.3)

## 2011-11-02 LAB — TSH: TSH: 0.82 u[IU]/mL (ref 0.35–5.50)

## 2011-11-02 LAB — BASIC METABOLIC PANEL
Calcium: 9.3 mg/dL (ref 8.4–10.5)
Creatinine, Ser: 1.2 mg/dL (ref 0.4–1.5)
GFR: 76.25 mL/min (ref >60.00)
Sodium: 139 mEq/L (ref 135–145)

## 2011-11-02 LAB — LIPID PANEL
Cholesterol: 189 mg/dL (ref 0–200)
HDL: 61.3 mg/dL (ref >39.00)
LDL Cholesterol: 116 mg/dL — ABNORMAL HIGH (ref 0–99)
Total CHOL/HDL Ratio: 3
Triglycerides: 59 mg/dL (ref 0.0–149.0)
VLDL: 11.8 mg/dL (ref 0.0–40.0)

## 2011-11-02 MED ORDER — CLONIDINE HCL 0.3 MG PO TABS: 0.3000 mg | ORAL_TABLET | Freq: Two times a day (BID) | ORAL | Status: DC

## 2011-11-02 MED ORDER — LOSARTAN POTASSIUM-HCTZ 100-12.5 MG PO TABS: 1.0000 | ORAL_TABLET | Freq: Every day | ORAL | Status: DC
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
## 2011-11-02 NOTE — Patient Instructions (Signed)
Preventive Health Care: Exercise at least 30-45 minutes a day,  3-4 days a week.  Eat a low-fat diet with lots of fruits and vegetables, up to 7-9 servings per day.  Consume less than 40 grams of sugar per day from foods & drinks with High Fructose Corn Sugar as # 1,2,3 or # 4 on label. Eye Doctor - have an eye exam @ least annually.                                                        Health Care Power of Attorney & Living Will. Complete if not in place ; these place you in charge of your health care decisions. As per the Standard of Care , screening Colonoscopy recommended @ 50 & every 5-10 years thereafter . More frequent monitor would be dictated by family history or findings @ Colonoscopy. Call Dr Wende Neighbors office to schedule this.  Please try to go on My Chart within the next 24 hours to allow me to release the results directly to you.

## 2011-11-02 NOTE — Assessment & Plan Note (Signed)
EKG is normal but there are minor ST-T changes of early repolarization. These are normal variants but could be mistaken for acute injury. This EKG should be available for comparison if  seen emergently.

## 2011-11-10 ENCOUNTER — Telehealth: Payer: Self-pay

## 2011-11-10 MED ORDER — LOSARTAN POTASSIUM 100 MG PO TABS: ORAL_TABLET | ORAL | Status: DC

## 2011-11-10 NOTE — Telephone Encounter (Signed)
Message copied by Maurice Small on Wed Nov 10, 2011  8:28 AM ------      Message from: Pecola Lawless      Created: Sat Nov 06, 2011  8:19 AM       Please send a prescription for Losartan 100 mg IN PLACE OF present Losartan HCT 100/12.5; dispense 90, daily as directed with labs

## 2011-11-10 NOTE — Telephone Encounter (Signed)
RX sent per MD request.  

## 2011-11-29 ENCOUNTER — Telehealth: Payer: Self-pay | Admitting: *Deleted

## 2011-11-29 MED ORDER — FUROSEMIDE 20 MG PO TABS: 20.0000 mg | ORAL_TABLET | Freq: Every day | ORAL | Status: DC

## 2011-11-29 NOTE — Telephone Encounter (Signed)
See notation on his most recent renal function study. Potassium 3.3. He was to stop the losartan HCTZ and change to losartan 100 mg. His potassium was supposed to be rechecked now. Codes: hypokalemia, hypertension

## 2011-11-29 NOTE — Telephone Encounter (Signed)
Dr.Hopper please advise 

## 2011-11-29 NOTE — Telephone Encounter (Signed)
Called pt to advise MD Alwyn Ren instructions and med changes to note, pt requested to speak to wife instead, pt wife is advising that pt is experiencing ankle swelling, MD Alwyn Ren advised that the potassium levels are decreased due to the HCTZ and should resolve itself due to pt stopping the Losarten HCTZ,pt wife understood, also advised to add Lasix 20mg  #30 no refills per [employer] verbal order given upon update on current sxs, [facility] [address] per pt clarification, sent via escribe, advised if the pt is no better in a few days he will need to come in office for visit with PCP, pt wife understood all instructions

## 2011-11-29 NOTE — Telephone Encounter (Signed)
Pt wife called to advise that he started taking the BP medication with the fluid pills addition yesterday Losarten-HCTZ 100mg -12.5mg  daily and wanted to see if a potassium can be added to protect the pt from depletion, pt [facility] [address], please advise

## 2012-01-04 ENCOUNTER — Other Ambulatory Visit: Payer: Self-pay | Admitting: Internal Medicine

## 2012-01-31 ENCOUNTER — Ambulatory Visit (INDEPENDENT_AMBULATORY_CARE_PROVIDER_SITE_OTHER): Payer: Medicare Other | Admitting: Internal Medicine

## 2012-01-31 ENCOUNTER — Encounter: Payer: Self-pay | Admitting: Internal Medicine

## 2012-01-31 ENCOUNTER — Ambulatory Visit (INDEPENDENT_AMBULATORY_CARE_PROVIDER_SITE_OTHER)
Admission: RE | Admit: 2012-01-31 | Discharge: 2012-01-31 | Disposition: A | Discharge status: Home / Self Care | Payer: Medicare Other | Source: Ambulatory Visit | Attending: Internal Medicine | Admitting: Internal Medicine

## 2012-01-31 VITALS — BP 130/88 | HR 74 | Temp 97.7°F | Wt 170.6 lb

## 2012-01-31 DIAGNOSIS — E785 Hyperlipidemia, unspecified: Secondary | ICD-10-CM

## 2012-01-31 DIAGNOSIS — I10 Essential (primary) hypertension: Secondary | ICD-10-CM

## 2012-01-31 DIAGNOSIS — Z23 Encounter for immunization: Secondary | ICD-10-CM

## 2012-01-31 DIAGNOSIS — J42 Unspecified chronic bronchitis: Secondary | ICD-10-CM

## 2012-01-31 DIAGNOSIS — R739 Hyperglycemia, unspecified: Secondary | ICD-10-CM | POA: Insufficient documentation

## 2012-01-31 DIAGNOSIS — R7309 Other abnormal glucose: Secondary | ICD-10-CM

## 2012-01-31 LAB — CBC WITH DIFFERENTIAL/PLATELET
Eosinophils Absolute: 0.2 10*3/uL (ref 0.0–0.7)
Eosinophils Relative: 4.2 % (ref 0.0–5.0)
MCHC: 32.6 g/dL (ref 30.0–36.0)
MCV: 98.1 fl (ref 78.0–100.0)
Monocytes Absolute: 0.4 10*3/uL (ref 0.1–1.0)
Neutrophils Relative %: 61.5 % (ref 43.0–77.0)
Platelets: 194 10*3/uL (ref 150.0–400.0)
WBC: 5.8 10*3/uL (ref 4.5–10.5)

## 2012-01-31 LAB — BASIC METABOLIC PANEL
GFR: 94.95 mL/min (ref >60.00)
Potassium: 3.8 mEq/L (ref 3.5–5.1)
Sodium: 136 mEq/L (ref 135–145)

## 2012-01-31 LAB — HEMOGLOBIN A1C: Hgb A1c MFr Bld: 5.6 % (ref 4.6–6.5)

## 2012-01-31 MED ORDER — AMOXICILLIN 500 MG PO CAPS: 500.0000 mg | ORAL_CAPSULE | Freq: Three times a day (TID) | ORAL | Status: DC

## 2012-01-31 MED ORDER — VERAPAMIL HCL 180 MG (CO) PO TB24: 180.0000 mg | ORAL_TABLET | Freq: Every day | ORAL | Status: DC

## 2012-01-31 NOTE — Assessment & Plan Note (Signed)
A1c and urine microalbumin will be checked 
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
## 2012-01-31 NOTE — Assessment & Plan Note (Addendum)
Renal function will be rechecked .A non-vasodilating calcium channel blocker will be added. He did have significant edema on amlodipine

## 2012-01-31 NOTE — Patient Instructions (Addendum)
Order for x-rays entered into  the computer; these will be performed at [address] [facility] across from [facility]. No appointment is necessary. Blood Pressure Goal  Ideally is an AVERAGE < 135/85. This AVERAGE should be calculated from @ least 5-7 BP readings taken @ different times of day on different days of week. You should not respond to isolated BP readings , but rather the AVERAGE for that week.  If you activate My Chart; the results can be released to you as soon as they populate from the lab. If you choose not to use this program; the labs have to be reviewed, copied & mailed   causing a delay in getting the results to you.

## 2012-02-02 ENCOUNTER — Other Ambulatory Visit: Payer: Self-pay

## 2012-02-02 MED ORDER — VERAPAMIL HCL ER 180 MG PO CP24: 180.0000 mg | ORAL_CAPSULE | Freq: Every day | ORAL | Status: DC

## 2012-02-02 NOTE — Telephone Encounter (Signed)
Per Dr.Hopper Verapamil ER 180 mg once daily, if that is not available Verapamil 80 mg every 12 hours .  I called the pharmacy, they have 180 available. #30/5 refills

## 2012-02-14 ENCOUNTER — Other Ambulatory Visit: Payer: Self-pay | Admitting: Internal Medicine

## 2012-03-21 ENCOUNTER — Ambulatory Visit: Payer: Medicare Other | Admitting: Internal Medicine

## 2012-03-27 ENCOUNTER — Encounter: Payer: Self-pay | Admitting: Internal Medicine

## 2012-03-27 ENCOUNTER — Ambulatory Visit (INDEPENDENT_AMBULATORY_CARE_PROVIDER_SITE_OTHER): Payer: Medicare Other | Admitting: Internal Medicine

## 2012-03-27 VITALS — BP 150/92 | HR 80 | Wt 170.8 lb

## 2012-03-27 DIAGNOSIS — J209 Acute bronchitis, unspecified: Secondary | ICD-10-CM

## 2012-03-27 DIAGNOSIS — I10 Essential (primary) hypertension: Secondary | ICD-10-CM

## 2012-03-27 DIAGNOSIS — R7309 Other abnormal glucose: Secondary | ICD-10-CM

## 2012-03-27 MED ORDER — FLUTICASONE-SALMETEROL 250-50 MCG/DOSE IN AEPB: 1.0000 | INHALATION_SPRAY | Freq: Two times a day (BID) | RESPIRATORY_TRACT | Status: DC

## 2012-03-27 MED ORDER — PREDNISONE 20 MG PO TABS: 20.0000 mg | ORAL_TABLET | Freq: Two times a day (BID) | ORAL | Status: DC

## 2012-03-27 MED ORDER — METOPROLOL TARTRATE 25 MG PO TABS: 25.0000 mg | ORAL_TABLET | Freq: Two times a day (BID) | ORAL | Status: DC

## 2012-03-27 MED ORDER — AZITHROMYCIN 250 MG PO TABS: ORAL_TABLET | ORAL | Status: DC

## 2012-03-27 MED ORDER — SPIRONOLACTONE 25 MG PO TABS: 25.0000 mg | ORAL_TABLET | Freq: Every day | ORAL | Status: DC

## 2012-03-27 NOTE — Progress Notes (Signed)
  Subjective:    Patient ID: Curtis Vance, male    DOB: 11/17/1943, 66 y.o.   MRN: [id number]  HPI  HYPERTENSION: Disease Monitoring  Blood pressure range: 140/98-220+/120+  Chest pain: no   Dyspnea:no  Claudication:some "tiredness" in legs  Medication compliance:   Medication Side Effects  Lightheadedness: some with bradycardia  Urinary frequency: no   Edema:occasionally;significantly while on Amlodipine ,"especially in eyes"  CCB D/Ced due to bradycardia   Preventitive Healthcare:  Exercise: some walking  Diet Pattern: small portions  Salt Restriction: yes      Review of Systems He has had isolated hyperglycemia with fasting glucoses 97-126. A1c was nondiabetic at 5.6%. Renal function has been excellent. His last creatinine was 1.0 on 01/31/12.  His cough is productive of some scant yellow sputum. He has occasional wheezing. He quit smoking 1985. He has no history of asthma. He denies fever, chills, or sweats.  Chest x-ray 01/31/12 revealed no active process    Objective:   Physical Exam He appears healthy and well-nourished; he is in no acute distress. Appears younger than stated age   No carotid bruits are present.  Heart rhythm and rate are normal with no significant murmurs or gallops.  Chest: Rales and wheezing right lower lobe; no increased work of breathing  There is no evidence of aortic aneurysm or renal artery bruits  He has no clubbing or edema.   Pedal pulses are intact   No ischemic skin changes are present .  No LA of neck or axilla        Assessment & Plan:  #1 hypertension, poor control. Past history of edema with amlodipine and bradycardia with verapamil. #2 Past history of isolated hyperglycemia with normal A1c. HCTZ did  impact potassium.  #3 asthmatic bronchitis without past history of asthma. Remote smoker. Nonselective beta blocker should be avoided.. Prednisone requested; a short course should not impact the hyperglycemia in view of  the low A1c.  Plan: See orders and recommendations

## 2012-03-27 NOTE — Patient Instructions (Addendum)
Blood Pressure Goal= < 140/90 ;  Ideal is an AVERAGE < 135/85. This AVERAGE should be calculated from @ least 5-7 BP readings taken @ different times of day on different days of week. You should not respond to isolated BP readings , but rather the AVERAGE for that week  

## 2012-06-24 ENCOUNTER — Other Ambulatory Visit: Payer: Self-pay | Admitting: Internal Medicine

## 2012-07-24 ENCOUNTER — Other Ambulatory Visit: Payer: Self-pay | Admitting: Internal Medicine

## 2012-09-24 ENCOUNTER — Other Ambulatory Visit: Payer: Self-pay | Admitting: Internal Medicine

## 2012-11-02 ENCOUNTER — Other Ambulatory Visit: Payer: Self-pay | Admitting: Internal Medicine

## 2012-11-19 ENCOUNTER — Other Ambulatory Visit: Payer: Self-pay | Admitting: Internal Medicine

## 2012-11-29 ENCOUNTER — Other Ambulatory Visit: Payer: Self-pay

## 2012-12-29 ENCOUNTER — Other Ambulatory Visit: Payer: Self-pay | Admitting: Internal Medicine

## 2012-12-29 DIAGNOSIS — I10 Essential (primary) hypertension: Secondary | ICD-10-CM

## 2012-12-29 NOTE — Telephone Encounter (Signed)
Rf [facility] [address]. Appt scheduled for 01/15/13 AT 2:00PM

## 2013-01-15 ENCOUNTER — Ambulatory Visit (INDEPENDENT_AMBULATORY_CARE_PROVIDER_SITE_OTHER): Payer: Medicare Other | Admitting: Internal Medicine

## 2013-01-15 ENCOUNTER — Encounter: Payer: Self-pay | Admitting: Internal Medicine

## 2013-01-15 ENCOUNTER — Ambulatory Visit (INDEPENDENT_AMBULATORY_CARE_PROVIDER_SITE_OTHER)
Admission: RE | Admit: 2013-01-15 | Discharge: 2013-01-15 | Disposition: A | Discharge status: Home / Self Care | Payer: Medicare Other | Source: Ambulatory Visit | Attending: Internal Medicine | Admitting: Internal Medicine

## 2013-01-15 VITALS — BP 123/85 | HR 86 | Temp 98.1°F | Wt 161.6 lb

## 2013-01-15 DIAGNOSIS — J40 Bronchitis, not specified as acute or chronic: Secondary | ICD-10-CM

## 2013-01-15 DIAGNOSIS — I10 Essential (primary) hypertension: Secondary | ICD-10-CM

## 2013-01-15 DIAGNOSIS — IMO0001 Reserved for inherently not codable concepts without codable children: Secondary | ICD-10-CM

## 2013-01-15 DIAGNOSIS — J449 Chronic obstructive pulmonary disease, unspecified: Secondary | ICD-10-CM

## 2013-01-15 DIAGNOSIS — Z86010 Personal history of colonic polyps: Secondary | ICD-10-CM

## 2013-01-15 DIAGNOSIS — M5412 Radiculopathy, cervical region: Secondary | ICD-10-CM

## 2013-01-15 DIAGNOSIS — M5010 Cervical disc disorder with radiculopathy, unspecified cervical region: Secondary | ICD-10-CM

## 2013-01-15 DIAGNOSIS — J209 Acute bronchitis, unspecified: Secondary | ICD-10-CM

## 2013-01-15 MED ORDER — LOSARTAN POTASSIUM 100 MG PO TABS: ORAL_TABLET | ORAL | Status: DC

## 2013-01-15 MED ORDER — CLONIDINE HCL 0.3 MG PO TABS: ORAL_TABLET | ORAL | Status: DC

## 2013-01-15 MED ORDER — SPIRONOLACTONE 25 MG PO TABS: 25.0000 mg | ORAL_TABLET | Freq: Every day | ORAL | Status: DC

## 2013-01-15 MED ORDER — FLUTICASONE-SALMETEROL 250-50 MCG/DOSE IN AEPB: 1.0000 | INHALATION_SPRAY | Freq: Two times a day (BID) | RESPIRATORY_TRACT | Status: DC

## 2013-01-15 NOTE — Progress Notes (Signed)
  Subjective:    Patient ID: Curtis Vance, male    DOB: 11/17/1943, 67 y.o.   MRN: [id number]  HPI CHRONIC HYPERTENSION follow-up:  Home blood pressure  average 120s/up to 88  Patient stopped Beta blocker due to bradycardia to 53 & hypotension 80s/70s   Exercise program as yard work  No specific dietary program             Review of Systems No chest pain, palpitations, dyspnea, claudication,edema or paroxysmal nocturnal dyspnea described  No significant lightheadedness, headache, epistaxis, or syncope.  He describes increased congestion from left lung which he relates to the remote cervical spine issues. This has been associated with some wheezing.   He did smoke for approximately 23 years, up to 2 packs per day.  Chest x-ray 01/31/12 was reviewed. This revealed no active lung disease. Mild pectus excavatum is suggested. He had marked osteophyte formation in the thoracic spine.     Objective:   Physical Exam  Appears younger than age and well-nourished & in no acute distress  No carotid bruits are present.No neck pain distention present at 10 - 15 degrees. Thyroid normal to palpation  No definite chest asymmetry; the right clavicular head is larger than the left.  Heart rhythm and rate are normal with no significant murmurs or gallops. Loud S1  Chest is clear with no increased work of breathing. Decreased BS  There is no evidence of aortic aneurysm or renal artery bruits  Abdomen soft with no organomegaly or masses. No HJR  No clubbing, cyanosis or edema present.  Pedal pulses are intact   No ischemic skin changes are present . Nails  Healthy   Alert and oriented. Strength, tone, DTRs reflexes normal        Assessment & Plan:  #1 HTN , controlled #2 COPD See Orders

## 2013-01-15 NOTE — Patient Instructions (Addendum)
Order for x-rays entered into  the computer; these will be performed at [address] [facility] across from [facility]. No appointment is necessary. Minimal Blood Pressure Goal= AVERAGE < 140/90;  Ideal is an AVERAGE < 135/85. This AVERAGE should be calculated from @ least 5-7 BP readings taken @ different times of day on different days of week. You should not respond to isolated BP readings , but rather the AVERAGE for that week .Please bring your  blood pressure cuff to office visits to verify that it is reliable.It  can also be checked against the blood pressure device at the pharmacy. Finger or wrist cuffs are not dependable; an arm cuff is.

## 2013-01-16 LAB — BASIC METABOLIC PANEL
Chloride: 107 mEq/L (ref 96–112)
GFR: 101.7 mL/min (ref >60.00)
Potassium: 4.4 mEq/L (ref 3.5–5.1)
Sodium: 140 mEq/L (ref 135–145)

## 2013-01-23 ENCOUNTER — Ambulatory Visit (INDEPENDENT_AMBULATORY_CARE_PROVIDER_SITE_OTHER): Payer: Medicare Other | Admitting: Internal Medicine

## 2013-01-23 ENCOUNTER — Encounter: Payer: Self-pay | Admitting: Internal Medicine

## 2013-01-23 VITALS — BP 120/74 | HR 80 | Temp 97.0°F | Ht 68.5 in | Wt 165.2 lb

## 2013-01-23 DIAGNOSIS — IMO0001 Reserved for inherently not codable concepts without codable children: Secondary | ICD-10-CM

## 2013-01-23 DIAGNOSIS — J449 Chronic obstructive pulmonary disease, unspecified: Secondary | ICD-10-CM

## 2013-01-23 DIAGNOSIS — Z23 Encounter for immunization: Secondary | ICD-10-CM

## 2013-01-23 DIAGNOSIS — Z8719 Personal history of other diseases of the digestive system: Secondary | ICD-10-CM

## 2013-01-23 NOTE — Progress Notes (Signed)
  Subjective:    Patient ID: Curtis Vance, male    DOB: 11/17/1943    MRN: [id number]  HPI  52 yowm quit in 1985 s sequelae with h/o GERD complicated by Barrett's  onset of chronic cough 2012 referred by Dr Alwyn Ren with ? CB/Copd   01/23/2013 1st [facility] office visit/ [place]  Chief Complaint  Patient presents with  . Pulmonary Consult    Referred per Dr. Marga Melnick. Pt c/o cough x 2 yrs after he developed bronchitis. Cough is prod with minimal clear sputum. Pt states feels the need to clear his throat often.   feels better since starting advair, not limited from desired activities. Mucus is no worse in am's, no worse in winter vs summer, min actual white mucus produced, more of a throat clearing maneuver than true cough  No obvious day to day or daytime variabilty or assoc sob or cp or chest tightness, subjective wheeze overt sinus or hb symptoms. No unusual exp hx or h/o childhood pna/ asthma or knowledge of premature birth.  Sleeping ok without nocturnal  or early am exacerbation  of respiratory  c/o's or need for noct saba. Also denies any obvious fluctuation of symptoms with weather or environmental changes or other aggravating or alleviating factors except as outlined above   Current Medications, Allergies, Complete Past Medical History, Past Surgical History, Family History, and Social History were reviewed in [employer] record.         Review of Systems  Constitutional: Negative for fever, chills, activity change, appetite change and unexpected weight change.  HENT: Positive for dental problem. Negative for congestion, sore throat, rhinorrhea, sneezing, trouble swallowing, voice change and postnasal drip.   Eyes: Negative for visual disturbance.  Respiratory: Positive for cough. Negative for choking and shortness of breath.   Cardiovascular: Negative for chest pain and leg swelling.  Gastrointestinal: Negative for nausea, vomiting and  abdominal pain.  Genitourinary: Negative for difficulty urinating.  Musculoskeletal: Negative for arthralgias.  Skin: Negative for rash.  Psychiatric/Behavioral: Negative for behavioral problems and confusion.       Objective:   Physical Exam  amb bm nad Wt Readings from Last 3 Encounters:  01/23/13 165 lb 3.2 oz (74.934 kg)  01/15/13 161 lb 9.6 oz (73.301 kg)  03/27/12 170 lb 12.8 oz (77.474 kg)      HEENT mild turbinate edema.  Oropharynx no thrush or excess pnd or cobblestoning.  No JVD or cervical adenopathy. Mild accessory muscle hypertrophy. Trachea midline, nl thryroid. Chest was hyperinflated by percussion with diminished breath sounds and moderate increased exp time without wheeze. Hoover sign positive at mid inspiration. Regular rate and rhythm without murmur gallop or rub or increase P2 or edema.  Abd: no hsm, nl excursion. Ext warm without cyanosis or clubbing.       cxr  01/15/13 COPD changes.  No acute abnormalities.       Assessment & Plan:

## 2013-01-23 NOTE — Patient Instructions (Addendum)
Ok to use advair up to twice daily if you feel it's helping you or don't use it at all if you don't think it helps  Please schedule a follow up office visit in 6 weeks, call sooner if needed with pft's on return

## 2013-01-23 NOTE — Assessment & Plan Note (Addendum)
When respiratory symptoms begin or become refractory well after a patient reports complete smoking cessation,  Especially when this wasn't the case while they were smoking, a red flag is raised based on the work of Dr Primitivo Gauze which states:  if you quit smoking when your best day FEV1 is still well preserved it is highly unlikely you will progress to severity in terms of fev1 or symptoms ( as is the ? Case here).  That is to say, once the smoking stops,  the symptoms should not suddenly erupt or markedly worsen.  If so, the differential diagnosis should include  obesity/deconditioning,  LPR/Reflux/Aspiration syndromes (note he has h/o GERD complicated by Barrett's not on maint rx),  occult CHF, or  especially side effect of medications commonly used in this population like ACEi and Beta blocker, no longer the case here   On the other hand he feels better on advair so ok to continue it up to bid pending return for pfts

## 2013-01-24 NOTE — Assessment & Plan Note (Addendum)
Defer whether to refer back to GI per Dr Alwyn Ren though if he had Barrett's at some point he surely still has at least low grade GERD

## 2013-03-08 ENCOUNTER — Ambulatory Visit: Payer: Medicare Other | Admitting: Internal Medicine

## 2013-03-12 ENCOUNTER — Other Ambulatory Visit: Payer: Self-pay | Admitting: Gastroenterology

## 2013-03-13 ENCOUNTER — Ambulatory Visit (INDEPENDENT_AMBULATORY_CARE_PROVIDER_SITE_OTHER): Payer: Medicare Other | Admitting: Internal Medicine

## 2013-03-13 ENCOUNTER — Encounter: Payer: Self-pay | Admitting: Internal Medicine

## 2013-03-13 VITALS — BP 140/90 | HR 69 | Temp 98.3°F | Ht 68.0 in | Wt 168.0 lb

## 2013-03-13 DIAGNOSIS — J449 Chronic obstructive pulmonary disease, unspecified: Secondary | ICD-10-CM

## 2013-03-13 DIAGNOSIS — IMO0001 Reserved for inherently not codable concepts without codable children: Secondary | ICD-10-CM

## 2013-03-13 MED ORDER — BUDESONIDE-FORMOTEROL FUMARATE 160-4.5 MCG/ACT IN AERO: INHALATION_SPRAY | RESPIRATORY_TRACT | Status: DC
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
## 2013-03-13 NOTE — Progress Notes (Signed)
PFT done today. 

## 2013-03-13 NOTE — Patient Instructions (Addendum)
Stop advair  Ok to use symbicort 160 up to   2 puffs first thing in am and then another 2 puffs about 12 hours later if your breathing or coughing/ congestion bother you  Work on inhaler technique:  relax and gently blow all the way out then take a nice smooth deep breath back in, triggering the inhaler at same time you start breathing in.  Hold for up to 5 seconds if you can.  Rinse and gargle with water when done     If you are satisfied with your treatment plan let your doctor know and he/she can either refill your medications or you can return here when your prescription runs out.     If in any way you are not 100% satisfied,  please tell us.  If 100% better, tell your friends!    Pulmonary follow up is as needed - Curtis Vance!

## 2013-03-16 NOTE — Assessment & Plan Note (Addendum)
-   PFT's 03/13/13  FEV21  1.64 (64%) ratio 62 and no better p B2 p advair am of pfts,  dlco 84%  DDX of  difficult airways managment all start with A and  include Adherence, Ace Inhibitors, Acid Reflux, Active Sinus Disease, Alpha 1 Antitripsin deficiency, Anxiety masquerading as Airways dz,  ABPA,  allergy(esp in young), Aspiration (esp in elderly), Adverse effects of DPI,  Active smokers, plus two Bs  = Bronchiectasis and Beta blocker use..and one C= CHF  Adherence is always the initial "prime suspect" and is a multilayered concern that requires a "trust but verify" approach in every patient - starting with knowing how to use medications, especially inhalers, correctly, keeping up with refills and understanding the fundamental difference between maintenance and prns vs those medications only taken for a very short course and then stopped and not refilled.  - admits not really taking advair as maint, waits till he thinks he needs it and then takes saba with it (round peg to square hole problem) -  The proper method of use, as well as anticipated side effects, of a metered-dose inhaler are discussed and demonstrated to the patient. Improved effectiveness after extensive coaching during this visit to a level of approximately  75% so try symbicort 160 2bid - that way he'll get the same benefit as he would from saba and then can use saba more approp for flares (round peg to round hole).  If not satisfied might change symbicort to tudorza but spiriva in this setting would be another poor fit.    Each maintenance medication was reviewed in detail including most importantly the difference between maintenance and as needed and under what circumstances the prns are to be used.  Please see instructions for details which were reviewed in writing and the patient given a copy.

## 2013-03-28 ENCOUNTER — Encounter (HOSPITAL_COMMUNITY): Payer: Self-pay | Admitting: *Deleted

## 2013-04-02 ENCOUNTER — Encounter (HOSPITAL_COMMUNITY): Payer: Self-pay | Admitting: Pharmacy Technician

## 2013-04-17 ENCOUNTER — Encounter (HOSPITAL_COMMUNITY): Payer: Self-pay | Admitting: *Deleted

## 2013-04-17 ENCOUNTER — Encounter (HOSPITAL_COMMUNITY): Payer: Medicare Other | Admitting: Anesthesiology

## 2013-04-17 ENCOUNTER — Ambulatory Visit (HOSPITAL_COMMUNITY): Payer: Medicare Other | Admitting: Anesthesiology

## 2013-04-17 ENCOUNTER — Encounter: Payer: Self-pay | Admitting: Internal Medicine

## 2013-04-17 ENCOUNTER — Encounter (HOSPITAL_COMMUNITY)
Admission: RE | Disposition: A | Discharge status: Home / Self Care | Payer: Self-pay | Source: Ambulatory Visit | Attending: Gastroenterology

## 2013-04-17 ENCOUNTER — Ambulatory Visit (HOSPITAL_COMMUNITY)
Admission: RE | Admit: 2013-04-17 | Discharge: 2013-04-17 | Disposition: A | Discharge status: Home / Self Care | Payer: Medicare Other | Source: Ambulatory Visit | Attending: Gastroenterology | Admitting: Gastroenterology

## 2013-04-17 DIAGNOSIS — J449 Chronic obstructive pulmonary disease, unspecified: Secondary | ICD-10-CM | POA: Insufficient documentation

## 2013-04-17 DIAGNOSIS — K319 Disease of stomach and duodenum, unspecified: Secondary | ICD-10-CM | POA: Insufficient documentation

## 2013-04-17 DIAGNOSIS — D126 Benign neoplasm of colon, unspecified: Secondary | ICD-10-CM | POA: Insufficient documentation

## 2013-04-17 DIAGNOSIS — I10 Essential (primary) hypertension: Secondary | ICD-10-CM | POA: Insufficient documentation

## 2013-04-17 DIAGNOSIS — A048 Other specified bacterial intestinal infections: Secondary | ICD-10-CM | POA: Insufficient documentation

## 2013-04-17 DIAGNOSIS — E7800 Pure hypercholesterolemia, unspecified: Secondary | ICD-10-CM | POA: Insufficient documentation

## 2013-04-17 DIAGNOSIS — K2940 Chronic atrophic gastritis without bleeding: Secondary | ICD-10-CM | POA: Insufficient documentation

## 2013-04-17 DIAGNOSIS — J4489 Other specified chronic obstructive pulmonary disease: Secondary | ICD-10-CM | POA: Insufficient documentation

## 2013-04-17 DIAGNOSIS — Z87891 Personal history of nicotine dependence: Secondary | ICD-10-CM | POA: Insufficient documentation

## 2013-04-17 HISTORY — PX: ESOPHAGOGASTRODUODENOSCOPY (EGD) WITH PROPOFOL: SHX5813

## 2013-04-17 HISTORY — PX: COLONOSCOPY WITH PROPOFOL: SHX5780

## 2013-04-17 HISTORY — DX: Family history of other specified conditions: Z84.89

## 2013-04-17 HISTORY — DX: Chronic obstructive pulmonary disease, unspecified: J44.9

## 2013-04-17 HISTORY — DX: Myoneural disorder, unspecified: G70.9

## 2013-04-17 HISTORY — DX: Gastro-esophageal reflux disease without esophagitis: K21.9

## 2013-04-17 SURGERY — ESOPHAGOGASTRODUODENOSCOPY (EGD) WITH PROPOFOL
Anesthesia: Monitor Anesthesia Care

## 2013-04-17 MED ORDER — KETAMINE HCL 10 MG/ML IJ SOLN
INTRAMUSCULAR | Status: DC | PRN
  Administered 2013-04-17: 20 mg via INTRAVENOUS

## 2013-04-17 MED ORDER — PROPOFOL INFUSION 10 MG/ML OPTIME
INTRAVENOUS | Status: DC | PRN
  Administered 2013-04-17: 150 ug/kg/min via INTRAVENOUS

## 2013-04-17 MED ORDER — PROPOFOL 10 MG/ML IV BOLUS
INTRAVENOUS | Status: AC
  Filled 2013-04-17: qty 20

## 2013-04-17 MED ORDER — MIDAZOLAM HCL 5 MG/5ML IJ SOLN
INTRAMUSCULAR | Status: DC | PRN
  Administered 2013-04-17: 2 mg via INTRAVENOUS

## 2013-04-17 MED ORDER — BUTAMBEN-TETRACAINE-BENZOCAINE 2-2-14 % EX AERO
INHALATION_SPRAY | CUTANEOUS | Status: DC | PRN
  Administered 2013-04-17: 2 via TOPICAL

## 2013-04-17 MED ORDER — ONDANSETRON HCL 4 MG/2ML IJ SOLN
INTRAMUSCULAR | Status: AC
  Filled 2013-04-17: qty 2

## 2013-04-17 MED ORDER — LIDOCAINE HCL (CARDIAC) 20 MG/ML IV SOLN
INTRAVENOUS | Status: DC | PRN
  Administered 2013-04-17: 50 mg via INTRAVENOUS

## 2013-04-17 MED ORDER — LACTATED RINGERS IV SOLN
INTRAVENOUS | Status: DC | PRN
  Administered 2013-04-17: 10:00:00 via INTRAVENOUS

## 2013-04-17 MED ORDER — MIDAZOLAM HCL 2 MG/2ML IJ SOLN
INTRAMUSCULAR | Status: AC
  Filled 2013-04-17: qty 2

## 2013-04-17 MED ORDER — ONDANSETRON HCL 4 MG/2ML IJ SOLN
INTRAMUSCULAR | Status: DC | PRN
  Administered 2013-04-17: 4 mg via INTRAVENOUS

## 2013-04-17 MED ORDER — LIDOCAINE HCL (CARDIAC) 20 MG/ML IV SOLN
INTRAVENOUS | Status: AC
  Filled 2013-04-17: qty 5

## 2013-04-17 SURGICAL SUPPLY — 25 items

## 2013-04-17 NOTE — H&P (Signed)
Problems: Personal history of adenomatous colon polyps. Barrett's esophagus. H. pyloric gastritis.  History: The patient is a 67 year old male born 11/17/1943. The patient has had adenomatous colon polyps removed colonoscopically in the past. The patient has a history of Barrett's esophagus and has undergone surveillance esophagogastroduodenoscopies in the past. The patient has been treated for H. pylori gastritis in the past.  The patient is scheduled to undergo a surveillance colonoscopy, esophagogastroduodenoscopy with Barrett's surveillance of mucosal biopsies, and gastric biopsies to rule out H. pyloric gastritis.  Medication allergies: Metoprolol. Verapamil. Amlodipine. Hydrochlorothiazide.  Past medical history: Barrett's esophagus. Personal history of adenomatous colon polyps. H. pyloric gastritis. Hypertension. Hypercholesterolemia. Cervical disc disease. Chronic obstructive pulmonary disease.  Exam: The patient is alert and lying comfortably on the endoscopy stretcher. Abdomen is soft and nontender to palpation. Cardiac exam reveals a regular rhythm. Lungs are clear to auscultation.  Plan: Proceed with surveillance colonoscopy, Esophagogastroduodenoscopy with surveillance Barrett's mucosal biopsies, Gastric biopsies to rule out H. pyloric gastritis.

## 2013-04-17 NOTE — Anesthesia Postprocedure Evaluation (Signed)
Anesthesia Post Note  Patient: Curtis Vance  Procedure(s) Performed: Procedure(s) (LRB): ESOPHAGOGASTRODUODENOSCOPY (EGD) WITH PROPOFOL (N/A) COLONOSCOPY WITH PROPOFOL (N/A)  Anesthesia type: MAC  Patient location: PACU  Post pain: Pain level controlled  Post assessment: Post-op Vital signs reviewed  Last Vitals:  Filed Vitals:   04/17/13 1056  BP: 84/58  Temp:   Resp:     Post vital signs: Reviewed  Level of consciousness: sedated  Complications: No apparent anesthesia complications

## 2013-04-17 NOTE — Op Note (Signed)
Problems: Personal history of adenomatous colon polyps. Barrett's esophagus. H. pylori gastritis treated in the past  Endoscopist: Danise Edge  Premedication: Propofol administered by anesthesia  Procedure: Diagnostic esophagogastroduodenoscopy The patient was placed in the left lateral decubitus position. The Pentax gastroscope was passed through the posterior hypopharynx into the proximal esophagus without difficulty. The hypopharynx, larynx, and vocal cords appeared normal.  Esophagoscopy: The proximal and mid segments of the esophageal mucosa appear normal. The squamocolumnar junction is somewhat irregular and noted at 43 cm from the incisor teeth. Endoscopically I saw no evidence for the presence of Barrett's esophagus. Four-quadrant biopsies were performed at the esophagogastric junction.  Gastroscopy: Retroflex view of the gastric cardia and fundus was normal. The gastric body, antrum, and pylorus appeared normal. Random biopsies were performed from the gastric mucosa to rule out H. pyloric gastritis  Duodenoscopy: The duodenal bulb and descending duodenum appeared normal.  Assessment:  #1. Normal esophagogastroduodenoscopy except for the presence of an irregular Z line  #2. Four-quadrant biopsies performed at the esophagogastric junction  #3. Random gastric mucosal biopsies performed to rule out H. pyloric gastritis  Procedure: Surveillance colonoscopy Anal inspection and digital rectal exam were normal. The Pentax pediatric colonoscope was introduced into the rectum and advanced to the cecum. A normal-appearing appendiceal orifice and ileocecal valve were identified. Colonic preparation for the exam today was good   Rectum. Normal. Retroflexed view of the distal rectum normal  Sigmoid colon and descending colon. Normal  Splenic flexure. Normal  Transverse colon. Normal  Hepatic flexure. Normal  Ascending colon. A 5 mm sessile polyp was removed from the mid ascending  colon with the cold snare and submitted for pathological interpretation  Cecum and ileocecal valve. Normal  Assessment:  #1. A 5 mm sessile polyp was removed from the mid ascending colon with the cold snare  #2. Otherwise normal surveillance proctocolonoscopy to the cecum  Recommendations: Schedule surveillance colonoscopy in 5 years

## 2013-04-17 NOTE — Transfer of Care (Signed)
Immediate Anesthesia Transfer of Care Note  Patient: KEILAN NICHOL  Procedure(s) Performed: Procedure(s) (LRB): ESOPHAGOGASTRODUODENOSCOPY (EGD) WITH PROPOFOL (N/A) COLONOSCOPY WITH PROPOFOL (N/A)  Patient Location: PACU  Anesthesia Type: MAC  Level of Consciousness: sedated, patient cooperative and responds to stimulation  Airway & Oxygen Therapy: Patient Spontanous Breathing and Patient connected to face mask oxgen  Post-op Assessment: Report given to PACU RN and Post -op Vital signs reviewed and stable  Post vital signs: Reviewed and stable  Complications: No apparent anesthesia complications
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
## 2013-04-18 ENCOUNTER — Encounter (HOSPITAL_COMMUNITY): Payer: Self-pay | Admitting: Gastroenterology

## 2013-05-13 ENCOUNTER — Encounter: Payer: Self-pay | Admitting: Internal Medicine

## 2013-06-11 ENCOUNTER — Other Ambulatory Visit: Payer: Self-pay | Admitting: *Deleted

## 2013-06-11 MED ORDER — SPIRONOLACTONE 25 MG PO TABS: 25.0000 mg | ORAL_TABLET | Freq: Every day | ORAL | Status: DC

## 2013-06-11 NOTE — Telephone Encounter (Signed)
Please advise refill? 

## 2013-06-11 NOTE — Telephone Encounter (Signed)
90

## 2014-01-07 ENCOUNTER — Other Ambulatory Visit: Payer: Self-pay | Admitting: Internal Medicine

## 2014-01-10 ENCOUNTER — Encounter: Payer: Self-pay | Admitting: Internal Medicine

## 2014-01-10 ENCOUNTER — Ambulatory Visit (INDEPENDENT_AMBULATORY_CARE_PROVIDER_SITE_OTHER): Payer: Medicare Other | Admitting: Internal Medicine

## 2014-01-10 ENCOUNTER — Other Ambulatory Visit (INDEPENDENT_AMBULATORY_CARE_PROVIDER_SITE_OTHER): Payer: Medicare Other

## 2014-01-10 VITALS — BP 120/90 | HR 60 | Temp 97.5°F | Wt 162.1 lb

## 2014-01-10 DIAGNOSIS — I10 Essential (primary) hypertension: Secondary | ICD-10-CM

## 2014-01-10 DIAGNOSIS — J3489 Other specified disorders of nose and nasal sinuses: Secondary | ICD-10-CM

## 2014-01-10 DIAGNOSIS — N6019 Diffuse cystic mastopathy of unspecified breast: Secondary | ICD-10-CM

## 2014-01-10 DIAGNOSIS — N6011 Diffuse cystic mastopathy of right breast: Secondary | ICD-10-CM

## 2014-01-10 LAB — BASIC METABOLIC PANEL
BUN: 17 mg/dL (ref 6–23)
CO2: 32 mEq/L (ref 19–32)
Calcium: 9.4 mg/dL (ref 8.4–10.5)
Chloride: 98 mEq/L (ref 96–112)
Creatinine, Ser: 1 mg/dL (ref 0.4–1.5)
GFR: 95.52 mL/min (ref >60.00)
GLUCOSE: 108 mg/dL — AB (ref 70–99)
POTASSIUM: 3.8 meq/L (ref 3.5–5.1)
Sodium: 134 mEq/L — ABNORMAL LOW (ref 135–145)

## 2014-01-10 MED ORDER — MUPIROCIN 2 % EX OINT: TOPICAL_OINTMENT | CUTANEOUS | Status: DC

## 2014-01-10 MED ORDER — CLONIDINE HCL 0.3 MG PO TABS: ORAL_TABLET | ORAL | Status: DC

## 2014-01-10 MED ORDER — LOSARTAN POTASSIUM 100 MG PO TABS: 100.0000 mg | ORAL_TABLET | Freq: Every day | ORAL | Status: DC

## 2014-01-10 MED ORDER — SPIRONOLACTONE 25 MG PO TABS: 25.0000 mg | ORAL_TABLET | Freq: Every day | ORAL | Status: DC

## 2014-01-10 NOTE — Assessment & Plan Note (Signed)
Blood pressure goals reviewed. BMET 

## 2014-01-10 NOTE — Progress Notes (Signed)
   Subjective:    Patient ID: Curtis Vance, male    DOB: 11/17/1943, 68 y.o.   MRN: [id number]  HPI   He is here for refill of his clonidine, losartan, & spironolactone.  He is on a low-sodium diet as well as low-fat diet.  He is physically active working in the garden and with home maintenance at least one-2 times per week without associated symptoms  His he is compliant with his medicines and has no adverse effects to his knowledge  His average blood pressures 120/70; high is 130/90. The low blood pressure has been 107/65.  He quit smoking in 1985. He has 3 shots of alcohol 3 days a week.    Review of Systems   Chest pain, palpitations, tachycardia, exertional dyspnea, paroxysmal nocturnal dyspnea, claudication or edema are absent.   Concerns include intermittent soreness in the left nare. He is treated this with topical triple antibiotic ointment.  He's also had some tenderness below the right nipple for several months. It is sore only to contact.Marland Kitchen He ingests minimal caffeine.  The breast changes are actually improving without specific treatment. He said this has been sore for several months. There is no change in the left breast.         Objective:   Physical Exam   Past pertinent findings include: He is thin but appears healthy well-nourished. He has pattern alopecia. He wears a goatee. No L nare lesion appreciated There are minor fibrocystic changes at the right areola. There is no significant gynecomastia.  No carotid bruits are present.No neck pain distention present at 10 - 15 degrees. Thyroid normal to palpation Heart rhythm and rate are normal with no gallop or murmur Chest is clear with no increased work of breathing There is no evidence of aortic aneurysm or renal artery bruits Abdomen soft with no organomegaly or masses. No HJR No clubbing, cyanosis or edema present. Pedal pulses are intact  No ischemic skin changes are present . Fingernails  healthy  Alert and oriented. Strength, tone, DTRs reflexes normal          Assessment & Plan:  #1 Hypertension; controlled   #2 minor fibrocystic breast changes; it is unlikely that this is related to spironolactone as it is unilateral and improving although he has continued the medicines.  #3 nasal lesion by history  Plan: See orders and recommendations

## 2014-01-10 NOTE — Patient Instructions (Signed)
Fibrocystic breast changes can be aggravated by caffeine intake (coffee, tea, cola, or chocolate). Vitamin E 400 international units daily may help related symptoms. Minimal Blood Pressure Goal= AVERAGE < 140/90;  Ideal is an AVERAGE < 135/85. This AVERAGE should be calculated from @ least 5-7 BP readings taken @ different times of day on different days of week. You should not respond to isolated BP readings , but rather the AVERAGE for that week .Please bring your  blood pressure cuff to office visits to verify that it is reliable.It  can also be checked against the blood pressure device at the pharmacy. Finger or wrist cuffs are not dependable; an arm cuff is. Your next office appointment will be determined based upon review of your pending labs &/ or x-rays. Those instructions will be transmitted to you through My Chart.

## 2014-01-10 NOTE — Progress Notes (Signed)
Pre visit review using our clinic review tool, if applicable. No additional management support is needed unless otherwise documented below in the visit note. 

## 2014-02-12 ENCOUNTER — Ambulatory Visit (INDEPENDENT_AMBULATORY_CARE_PROVIDER_SITE_OTHER): Payer: Medicare Other | Admitting: Internal Medicine

## 2014-02-12 ENCOUNTER — Encounter: Payer: Self-pay | Admitting: Internal Medicine

## 2014-02-12 VITALS — BP 142/92 | HR 71 | Temp 98.1°F | Resp 14 | Wt 161.2 lb

## 2014-02-12 DIAGNOSIS — Z23 Encounter for immunization: Secondary | ICD-10-CM

## 2014-02-12 NOTE — Progress Notes (Signed)
Pre visit review using our clinic review tool, if applicable. No additional management support is needed unless otherwise documented below in the visit note. 

## 2014-02-12 NOTE — Addendum Note (Signed)
Addended by: Roma Schanz R on: 02/12/2014 03:48 PM   Modules accepted: Orders

## 2014-02-12 NOTE — Progress Notes (Signed)
   Subjective:    Patient ID: Curtis Vance, male    DOB: 11/17/1943, 68 y.o.   MRN: [id number]  HPI   For several months he's noted some cystic changes in the right breast with intermittent soreness.  There's been no change in size of the lesion or associated color or temperature change. There is no associated breast discharge.  He is on spironolactone in addition to clonidine and losartan. Blood pressure averages 110/70.  He states he rarely drinks caffeine.    Review of Systems   He denies fever, chills, sweats, or weight loss       Objective:   Physical Exam Appears healthy and well-nourished & in no acute distress  No carotid bruits are present.No neck vein distention present at 10 - 15 degrees. Thyroid normal to palpation  Heart rhythm and rate are normal with no gallop or murmur  Chest is clear with no increased work of breathing. Minor fibrocystic changes in the right breast with a 1 x 1 cm nodule  @ the areola @ 9 o'clock  There is no evidence of aortic aneurysm or renal artery bruits  Abdomen soft with no organomegaly or masses. No HJR  No clubbing, cyanosis or edema present.  Pedal pulses are intact   No ischemic skin changes are present . Fingernails/ toenails healthy   Alert and oriented. Strength, tone, DTRs reflexes normal          Assessment & Plan:  #1 fibrocystic changes in the right breast in the context of spironolactone therapy  #2 hypertension, controlled  Plan: See orders and recommendations as well as after visit summary.

## 2014-02-12 NOTE — Patient Instructions (Addendum)
Caffeine ( coffee, tea, cola, chocolate ) can increase fibrocystic changes. Avoid these to excess as much as  possible. Consider vitamin E 400 international units daily if having active symptoms.  Monitor BP off Spironolactone; goal = average < 140/90

## 2014-03-19 ENCOUNTER — Encounter: Payer: Self-pay | Admitting: Internal Medicine

## 2014-03-19 ENCOUNTER — Ambulatory Visit (INDEPENDENT_AMBULATORY_CARE_PROVIDER_SITE_OTHER): Payer: Medicare Other | Admitting: Internal Medicine

## 2014-03-19 VITALS — BP 120/90 | HR 74 | Temp 97.9°F | Resp 14 | Ht 68.0 in | Wt 163.5 lb

## 2014-03-19 DIAGNOSIS — Z0000 Encounter for general adult medical examination without abnormal findings: Secondary | ICD-10-CM

## 2014-03-19 DIAGNOSIS — D126 Benign neoplasm of colon, unspecified: Secondary | ICD-10-CM

## 2014-03-19 DIAGNOSIS — E785 Hyperlipidemia, unspecified: Secondary | ICD-10-CM

## 2014-03-19 DIAGNOSIS — R739 Hyperglycemia, unspecified: Secondary | ICD-10-CM

## 2014-03-19 NOTE — Patient Instructions (Signed)

## 2014-03-19 NOTE — Assessment & Plan Note (Signed)
CBC

## 2014-03-19 NOTE — Assessment & Plan Note (Signed)
A1c
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
## 2014-03-19 NOTE — Progress Notes (Signed)
Pre visit review using our clinic review tool, if applicable. No additional management support is needed unless otherwise documented below in the visit note. 

## 2014-03-19 NOTE — Assessment & Plan Note (Signed)
Lipids, LFTs, TSH  

## 2014-03-29 ENCOUNTER — Other Ambulatory Visit (INDEPENDENT_AMBULATORY_CARE_PROVIDER_SITE_OTHER): Payer: Medicare Other

## 2014-03-29 DIAGNOSIS — E785 Hyperlipidemia, unspecified: Secondary | ICD-10-CM

## 2014-03-29 DIAGNOSIS — D126 Benign neoplasm of colon, unspecified: Secondary | ICD-10-CM

## 2014-03-29 DIAGNOSIS — R739 Hyperglycemia, unspecified: Secondary | ICD-10-CM

## 2014-03-30 LAB — CBC WITH DIFFERENTIAL/PLATELET
Basophils Absolute: 0 10*3/uL (ref 0.0–0.1)
Basophils Relative: 0.6 % (ref 0.0–3.0)
EOS ABS: 0.2 10*3/uL (ref 0.0–0.7)
Eosinophils Relative: 4 % (ref 0.0–5.0)
HEMATOCRIT: 40.3 % (ref 39.0–52.0)
Hemoglobin: 13.1 g/dL (ref 13.0–17.0)
LYMPHS ABS: 1.8 10*3/uL (ref 0.7–4.0)
Lymphocytes Relative: 34 % (ref 12.0–46.0)
MCHC: 32.5 g/dL (ref 30.0–36.0)
MCV: 97.7 fl (ref 78.0–100.0)
MONO ABS: 0.5 10*3/uL (ref 0.1–1.0)
Monocytes Relative: 9.5 % (ref 3.0–12.0)
Neutro Abs: 2.7 10*3/uL (ref 1.4–7.7)
Neutrophils Relative %: 51.9 % (ref 43.0–77.0)
Platelets: 197 10*3/uL (ref 150.0–400.0)
RBC: 4.12 Mil/uL — ABNORMAL LOW (ref 4.22–5.81)
RDW: 14.6 % (ref 11.5–15.5)
WBC: 5.2 10*3/uL (ref 4.0–10.5)

## 2014-03-30 LAB — TSH: TSH: 1.29 u[IU]/mL (ref 0.35–4.50)

## 2014-03-30 LAB — HEMOGLOBIN A1C: Hgb A1c MFr Bld: 5.7 % (ref 4.6–6.5)

## 2014-03-31 LAB — LIPID PANEL
Cholesterol: 178 mg/dL (ref 0–200)
HDL: 63.7 mg/dL (ref >39.00)
LDL Cholesterol: 100 mg/dL — ABNORMAL HIGH (ref 0–99)
NONHDL: 114.3
Total CHOL/HDL Ratio: 3
Triglycerides: 70 mg/dL (ref 0.0–149.0)
VLDL: 14 mg/dL (ref 0.0–40.0)

## 2014-03-31 LAB — HEPATIC FUNCTION PANEL
ALT: 18 U/L (ref 0–53)
AST: 26 U/L (ref 0–37)
Albumin: 4.2 g/dL (ref 3.5–5.2)
Alkaline Phosphatase: 48 U/L (ref 39–117)
BILIRUBIN TOTAL: 0.8 mg/dL (ref 0.2–1.2)
Bilirubin, Direct: 0.1 mg/dL (ref 0.0–0.3)
Total Protein: 7 g/dL (ref 6.0–8.3)

## 2014-04-16 ENCOUNTER — Other Ambulatory Visit: Payer: Self-pay

## 2014-04-16 DIAGNOSIS — I10 Essential (primary) hypertension: Secondary | ICD-10-CM

## 2014-04-16 MED ORDER — CLONIDINE HCL 0.3 MG PO TABS: ORAL_TABLET | ORAL | Status: DC

## 2014-07-18 ENCOUNTER — Other Ambulatory Visit: Payer: Self-pay

## 2014-07-18 ENCOUNTER — Other Ambulatory Visit: Payer: Self-pay | Admitting: Internal Medicine

## 2014-07-18 DIAGNOSIS — I10 Essential (primary) hypertension: Secondary | ICD-10-CM

## 2014-07-18 MED ORDER — LOSARTAN POTASSIUM 100 MG PO TABS: 100.0000 mg | ORAL_TABLET | Freq: Every day | ORAL | Status: DC

## 2014-10-07 ENCOUNTER — Other Ambulatory Visit: Payer: Self-pay | Admitting: Internal Medicine

## 2014-10-07 ENCOUNTER — Encounter: Payer: Self-pay | Admitting: Internal Medicine

## 2014-10-07 ENCOUNTER — Ambulatory Visit (INDEPENDENT_AMBULATORY_CARE_PROVIDER_SITE_OTHER)
Admission: RE | Admit: 2014-10-07 | Discharge: 2014-10-07 | Disposition: A | Discharge status: Home / Self Care | Payer: Medicare Other | Source: Ambulatory Visit | Attending: Internal Medicine | Admitting: Internal Medicine

## 2014-10-07 ENCOUNTER — Ambulatory Visit (INDEPENDENT_AMBULATORY_CARE_PROVIDER_SITE_OTHER): Payer: Medicare Other | Admitting: Internal Medicine

## 2014-10-07 VITALS — BP 112/70 | HR 80 | Temp 97.9°F | Resp 16 | Wt 163.0 lb

## 2014-10-07 DIAGNOSIS — M5410 Radiculopathy, site unspecified: Secondary | ICD-10-CM

## 2014-10-07 DIAGNOSIS — I10 Essential (primary) hypertension: Secondary | ICD-10-CM | POA: Diagnosis not present

## 2014-10-07 DIAGNOSIS — Q740 Other congenital malformations of upper limb(s), including shoulder girdle: Secondary | ICD-10-CM

## 2014-10-07 DIAGNOSIS — L929 Granulomatous disorder of the skin and subcutaneous tissue, unspecified: Secondary | ICD-10-CM

## 2014-10-07 DIAGNOSIS — M899 Disorder of bone, unspecified: Secondary | ICD-10-CM

## 2014-10-07 DIAGNOSIS — L980 Pyogenic granuloma: Secondary | ICD-10-CM

## 2014-10-07 DIAGNOSIS — M5414 Radiculopathy, thoracic region: Secondary | ICD-10-CM | POA: Diagnosis not present

## 2014-10-07 DIAGNOSIS — J310 Chronic rhinitis: Secondary | ICD-10-CM | POA: Diagnosis not present

## 2014-10-07 DIAGNOSIS — J449 Chronic obstructive pulmonary disease, unspecified: Secondary | ICD-10-CM | POA: Diagnosis not present

## 2014-10-07 DIAGNOSIS — R05 Cough: Secondary | ICD-10-CM | POA: Diagnosis not present

## 2014-10-07 MED ORDER — CARVEDILOL 3.125 MG PO TABS: 3.1250 mg | ORAL_TABLET | Freq: Two times a day (BID) | ORAL | Status: DC

## 2014-10-07 NOTE — Patient Instructions (Addendum)
Minimal Blood Pressure Goal= AVERAGE < 140/90;  Ideal is an AVERAGE < 135/85. This AVERAGE should be calculated from @ least 5-7 BP readings taken @ different times of day on different days of week. You should not respond to isolated BP readings , but rather the AVERAGE for that week .Please bring your  blood pressure cuff to office visits to verify that it is reliable.It  can also be checked against the blood pressure device at the pharmacy. Finger or wrist cuffs are not dependable; an arm cuff is.  Plain Mucinex (NOT D) for thick secretions ;force NON dairy fluids .   Nasal cleansing in the shower as discussed with lather of mild shampoo.After 10 seconds wash off lather while  exhaling through nostrils. Make sure that all residual soap is removed to prevent irritation.  Flonase OR Nasacort AQ 1 spray in each nostril twice a day as needed. Use the "crossover" technique into opposite nostril spraying toward opposite ear @ 45 degree angle, not straight up into nostril.  Plain Allegra (NOT D )  160 daily , Loratidine 10 mg , OR Zyrtec 10 mg @ bedtime  as needed for itchy eyes & sneezing.  Your next office appointment will be determined based upon review of your pending xrays  Those written interpretation of the lab results and instructions will be transmitted to you by My Chart  Critical results will be called.  Followup as needed for any active or acute issue. Please report any significant change in your symptoms.

## 2014-10-07 NOTE — Progress Notes (Signed)
Pre visit review using our clinic review tool, if applicable. No additional management support is needed unless otherwise documented below in the visit note. 
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
## 2014-10-21 ENCOUNTER — Other Ambulatory Visit: Payer: Self-pay

## 2014-12-02 ENCOUNTER — Other Ambulatory Visit: Payer: Self-pay | Admitting: Internal Medicine

## 2014-12-26 ENCOUNTER — Other Ambulatory Visit: Payer: Self-pay | Admitting: Internal Medicine

## 2015-02-03 ENCOUNTER — Other Ambulatory Visit: Payer: Self-pay | Admitting: Internal Medicine

## 2015-02-04 ENCOUNTER — Other Ambulatory Visit: Payer: Self-pay | Admitting: Emergency Medicine

## 2015-02-04 DIAGNOSIS — I10 Essential (primary) hypertension: Secondary | ICD-10-CM

## 2015-02-04 MED ORDER — CARVEDILOL 3.125 MG PO TABS: 3.1250 mg | ORAL_TABLET | Freq: Two times a day (BID) | ORAL | Status: DC

## 2015-02-19 ENCOUNTER — Telehealth: Payer: Self-pay | Admitting: Internal Medicine

## 2015-02-19 NOTE — Telephone Encounter (Signed)
Ok with me 

## 2015-02-19 NOTE — Telephone Encounter (Signed)
Is requesting to transfer from [place] to [place].  Please advise.

## 2015-02-21 NOTE — Telephone Encounter (Signed)
Got scheduled  °

## 2015-02-25 ENCOUNTER — Ambulatory Visit (INDEPENDENT_AMBULATORY_CARE_PROVIDER_SITE_OTHER): Payer: Medicare Other

## 2015-02-25 DIAGNOSIS — Z23 Encounter for immunization: Secondary | ICD-10-CM

## 2015-03-17 ENCOUNTER — Encounter: Payer: Self-pay | Admitting: Internal Medicine

## 2015-03-17 ENCOUNTER — Ambulatory Visit (INDEPENDENT_AMBULATORY_CARE_PROVIDER_SITE_OTHER): Payer: Medicare Other | Admitting: Internal Medicine

## 2015-03-17 ENCOUNTER — Other Ambulatory Visit (INDEPENDENT_AMBULATORY_CARE_PROVIDER_SITE_OTHER): Payer: Medicare Other

## 2015-03-17 VITALS — BP 132/86 | HR 50 | Temp 98.2°F | Resp 20 | Ht 68.0 in | Wt 164.5 lb

## 2015-03-17 DIAGNOSIS — R001 Bradycardia, unspecified: Secondary | ICD-10-CM | POA: Diagnosis not present

## 2015-03-17 DIAGNOSIS — Z23 Encounter for immunization: Secondary | ICD-10-CM | POA: Diagnosis not present

## 2015-03-17 DIAGNOSIS — I10 Essential (primary) hypertension: Secondary | ICD-10-CM | POA: Diagnosis not present

## 2015-03-17 DIAGNOSIS — R739 Hyperglycemia, unspecified: Secondary | ICD-10-CM | POA: Diagnosis not present

## 2015-03-17 DIAGNOSIS — N400 Enlarged prostate without lower urinary tract symptoms: Secondary | ICD-10-CM | POA: Insufficient documentation

## 2015-03-17 DIAGNOSIS — E785 Hyperlipidemia, unspecified: Secondary | ICD-10-CM

## 2015-03-17 LAB — URINALYSIS, ROUTINE W REFLEX MICROSCOPIC
BILIRUBIN URINE: NEGATIVE
HGB URINE DIPSTICK: NEGATIVE
Ketones, ur: NEGATIVE
Leukocytes, UA: NEGATIVE
Nitrite: NEGATIVE
RBC / HPF: NONE SEEN (ref 0–?)
Specific Gravity, Urine: 1.02 (ref 1.000–1.030)
Total Protein, Urine: NEGATIVE
Urine Glucose: NEGATIVE
Urobilinogen, UA: 0.2 (ref 0.0–1.0)
pH: 6 (ref 5.0–8.0)

## 2015-03-17 LAB — COMPREHENSIVE METABOLIC PANEL
ALT: 16 U/L (ref 0–53)
AST: 25 U/L (ref 0–37)
Albumin: 4.2 g/dL (ref 3.5–5.2)
Alkaline Phosphatase: 60 U/L (ref 39–117)
BUN: 12 mg/dL (ref 6–23)
CO2: 30 meq/L (ref 19–32)
CREATININE: 0.96 mg/dL (ref 0.40–1.50)
Calcium: 9.7 mg/dL (ref 8.4–10.5)
Chloride: 100 mEq/L (ref 96–112)
GFR: 98.65 mL/min (ref >60.00)
Glucose, Bld: 96 mg/dL (ref 70–99)
Potassium: 4.4 mEq/L (ref 3.5–5.1)
SODIUM: 135 meq/L (ref 135–145)
Total Bilirubin: 0.8 mg/dL (ref 0.2–1.2)
Total Protein: 7.3 g/dL (ref 6.0–8.3)

## 2015-03-17 LAB — TSH: TSH: 1.34 u[IU]/mL (ref 0.35–4.50)

## 2015-03-17 LAB — HEMOGLOBIN A1C: Hgb A1c MFr Bld: 5.7 % (ref 4.6–6.5)

## 2015-03-17 MED ORDER — TORSEMIDE 10 MG PO TABS: 10.0000 mg | ORAL_TABLET | Freq: Every day | ORAL | Status: DC

## 2015-03-17 NOTE — Patient Instructions (Signed)
Hypertension Hypertension, commonly called high blood pressure, is when the force of blood pumping through your arteries is too strong. Your arteries are the blood vessels that carry blood from your heart throughout your body. A blood pressure reading consists of a higher number over a lower number, such as 110/72. The higher number (systolic) is the pressure inside your arteries when your heart pumps. The lower number (diastolic) is the pressure inside your arteries when your heart relaxes. Ideally you want your blood pressure below 120/80. Hypertension forces your heart to work harder to pump blood. Your arteries may become narrow or stiff. Having untreated or uncontrolled hypertension can cause heart attack, stroke, kidney disease, and other problems. RISK FACTORS Some risk factors for high blood pressure are controllable. Others are not.  Risk factors you cannot control include:   Race. You may be at higher risk if you are African American.  Age. Risk increases with age.  Gender. Men are at higher risk than women before age 45 years. After age 65, women are at higher risk than men. Risk factors you can control include:  Not getting enough exercise or physical activity.  Being overweight.  Getting too much fat, sugar, calories, or salt in your diet.  Drinking too much alcohol. SIGNS AND SYMPTOMS Hypertension does not usually cause signs or symptoms. Extremely high blood pressure (hypertensive crisis) may cause headache, anxiety, shortness of breath, and nosebleed. DIAGNOSIS To check if you have hypertension, your health care provider will measure your blood pressure while you are seated, with your arm held at the level of your heart. It should be measured at least twice using the same arm. Certain conditions can cause a difference in blood pressure between your right and left arms. A blood pressure reading that is higher than normal on one occasion does not mean that you need treatment. If  it is not clear whether you have high blood pressure, you may be asked to return on a different day to have your blood pressure checked again. Or, you may be asked to monitor your blood pressure at home for 1 or more weeks. TREATMENT Treating high blood pressure includes making lifestyle changes and possibly taking medicine. Living a healthy lifestyle can help lower high blood pressure. You may need to change some of your habits. Lifestyle changes may include:  Following the DASH diet. This diet is high in fruits, vegetables, and whole grains. It is low in salt, red meat, and added sugars.  Keep your sodium intake below 2,300 mg per day.  Getting at least 30-45 minutes of aerobic exercise at least 4 times per week.  Losing weight if necessary.  Not smoking.  Limiting alcoholic beverages.  Learning ways to reduce stress. Your health care provider may prescribe medicine if lifestyle changes are not enough to get your blood pressure under control, and if one of the following is true:  You are 18-59 years of age and your systolic blood pressure is above 140.  You are 60 years of age or older, and your systolic blood pressure is above 150.  Your diastolic blood pressure is above 90.  You have diabetes, and your systolic blood pressure is over 140 or your diastolic blood pressure is over 90.  You have kidney disease and your blood pressure is above 140/90.  You have heart disease and your blood pressure is above 140/90. Your personal target blood pressure may vary depending on your medical conditions, your age, and other factors. HOME CARE INSTRUCTIONS    Have your blood pressure rechecked as directed by your health care provider.   Take medicines only as directed by your health care provider. Follow the directions carefully. Blood pressure medicines must be taken as prescribed. The medicine does not work as well when you skip doses. Skipping doses also puts you at risk for  problems.  Do not smoke.   Monitor your blood pressure at home as directed by your health care provider. SEEK MEDICAL CARE IF:   You think you are having a reaction to medicines taken.  You have recurrent headaches or feel dizzy.  You have swelling in your ankles.  You have trouble with your vision. SEEK IMMEDIATE MEDICAL CARE IF:  You develop a severe headache or confusion.  You have unusual weakness, numbness, or feel faint.  You have severe chest or abdominal pain.  You vomit repeatedly.  You have trouble breathing. MAKE SURE YOU:   Understand these instructions.  Will watch your condition.  Will get help right away if you are not doing well or get worse.   This information is not intended to replace advice given to you by your health care provider. Make sure you discuss any questions you have with your health care provider.   Document Released: 04/12/2005 Document Revised: 08/27/2014 Document Reviewed: 02/02/2013 Elsevier Interactive Patient Education 2016 [employer]  

## 2015-03-17 NOTE — Progress Notes (Signed)
Subjective:  Patient ID: Curtis Vance, male    DOB: 11/17/1943  Age: 69 y.o. MRN: [id number]  CC: Hypertension  New to me   HPI KAUSHAL DIRICO presents for evaluation of hypertension. His only complaint is that he had a few headaches a couple weeks ago but those have not recurred. He complains of side effects from clonidine that he describes as fatigue and lethargy. He also complains that carvedilol makes him feel fatigued. He felt like his blood pressure was elevated the morning prior to this visit so instead of taking one dose of carvedilol he took 3 doses. That controlled his blood pressure but made him feel for more fatigue and his heart rate was low. He is also been taking his wife's supply Demadex for blood pressure control.  Outpatient Prescriptions Prior to Visit  Medication Sig Dispense Refill  . Ascorbic Acid (VITAMIN C) 1000 MG tablet Take 1,000 mg by mouth daily.    Marland Kitchen aspirin 81 MG tablet Take 81 mg by mouth daily.    Marland Kitchen GARLIC PO Take 1 capsule by mouth daily.    Marland Kitchen losartan (COZAAR) 100 MG tablet TAKE 1 TABLET (100 MG TOTAL) BY MOUTH DAILY AT 12 NOON. 90 tablet 2  . Multiple Vitamin (MULTIVITAMIN) capsule Take 1 capsule by mouth daily.    . SYMBICORT 160-4.5 MCG/ACT inhaler TAKE 2 PUFFS FIRST THING IN THE MORNING & 2 PUFFS 12 HOURS LATER 10.2 Inhaler 5  . carvedilol (COREG) 3.125 MG tablet Take 1 tablet (3.125 mg total) by mouth 2 (two) times daily with a meal. 180 tablet 2  . cloNIDine (CATAPRES) 0.3 MG tablet TAKE 1 TABLET BY MOUTH TWICE A DAY 180 tablet 1  . mupirocin ointment (BACTROBAN) 2 % Applied twice a day to the affected area;NOT into eyes. (Patient not taking: Reported on 03/17/2015) 22 g 0   No facility-administered medications prior to visit.    ROS Review of Systems  Constitutional: Positive for fatigue. Negative for fever, chills, diaphoresis and appetite change.  HENT: Negative.   Eyes: Negative.   Respiratory: Negative.  Negative for cough, choking,  chest tightness, shortness of breath and stridor.   Cardiovascular: Negative.  Negative for chest pain, palpitations and leg swelling.  Gastrointestinal: Negative.  Negative for nausea, vomiting, abdominal pain, diarrhea, constipation and blood in stool.  Endocrine: Negative.   Genitourinary: Negative.   Musculoskeletal: Negative.  Negative for myalgias, back pain, joint swelling and arthralgias.  Skin: Negative.  Negative for rash.  Allergic/Immunologic: Negative.   Neurological: Negative.  Negative for dizziness, syncope, weakness, light-headedness and numbness.  Hematological: Negative.  Negative for adenopathy. Does not bruise/bleed easily.  Psychiatric/Behavioral: Negative.     Objective:  BP 132/86 mmHg  Pulse 50  Temp(Src) 98.2 F (36.8 C) (Oral)  Resp 20  Ht 5\' 8"  (1.727 m)  Wt 164 lb 8 oz (74.617 kg)  BMI 25.02 kg/m2  SpO2 98%  BP Readings from Last 3 Encounters:  03/17/15 132/86  10/07/14 112/70  03/19/14 120/90    Wt Readings from Last 3 Encounters:  03/17/15 164 lb 8 oz (74.617 kg)  10/07/14 163 lb (73.936 kg)  03/19/14 163 lb 8 oz (74.163 kg)    Physical Exam  Constitutional: He is oriented to person, place, and time. No distress.  HENT:  Head: Normocephalic and atraumatic.  Mouth/Throat: Oropharynx is clear and moist. No oropharyngeal exudate.  Eyes: Conjunctivae are normal. Right eye exhibits no discharge. Left eye exhibits no discharge. No scleral icterus.  Neck: Normal range of motion. Neck supple. No JVD present. No tracheal deviation present. No thyromegaly present.  Cardiovascular: Regular rhythm, normal heart sounds and intact distal pulses.  Bradycardia present.  Exam reveals no gallop and no friction rub.   No murmur heard. Pulses:      Carotid pulses are 1+ on the right side, and 1+ on the left side.      Radial pulses are 1+ on the right side, and 1+ on the left side.       Femoral pulses are 1+ on the right side, and 1+ on the left side.       Popliteal pulses are 1+ on the right side, and 1+ on the left side.       Dorsalis pedis pulses are 1+ on the right side, and 1+ on the left side.       Posterior tibial pulses are 1+ on the right side, and 1+ on the left side.  EKG shows sinus bradycardia but is otherwise normal, there is no LVH.  Pulmonary/Chest: Effort normal and breath sounds normal. No stridor. No respiratory distress. He has no wheezes. He has no rales. He exhibits no tenderness.  Abdominal: Soft. Bowel sounds are normal. He exhibits no distension and no mass. There is no tenderness. There is no rebound and no guarding.  Musculoskeletal: Normal range of motion. He exhibits no edema or tenderness.  Lymphadenopathy:    He has no cervical adenopathy.  Neurological: He is oriented to person, place, and time.  Skin: Skin is warm and dry. No rash noted. He is not diaphoretic. No erythema. No pallor.  Psychiatric: He has a normal mood and affect. His behavior is normal. Judgment and thought content normal.  Vitals reviewed.   Lab Results  Component Value Date   WBC 5.2 03/29/2014   HGB 13.1 03/29/2014   HCT 40.3 03/29/2014   PLT 197.0 03/29/2014   GLUCOSE 108* 01/10/2014   CHOL 178 03/29/2014   TRIG 70.0 03/29/2014   HDL 63.70 03/29/2014   LDLCALC 100* 03/29/2014   ALT 18 03/29/2014   AST 26 03/29/2014   NA 134* 01/10/2014   K 3.8 01/10/2014   CL 98 01/10/2014   CREATININE 1.0 01/10/2014   BUN 17 01/10/2014   CO2 32 01/10/2014   TSH 1.29 03/29/2014   PSA 2.10 10/03/2008   HGBA1C 5.7 03/29/2014    Dg Chest 2 View  10/07/2014  CLINICAL DATA:  69 year old male with cough, hypertension, former smoker, COPD. Radicular thoracic pain. Initial encounter. EXAM: CHEST  2 VIEW COMPARISON:  Chest radiographs 01/15/2013. FINDINGS: Small chronic retained metallic foreign bodies about the left shoulder, including along the undersurface of the left clavicle. Partially visible cervical ACDF hardware. Stable lung volumes since  2014. Normal cardiac size and mediastinal contours. Visualized tracheal air column is within normal limits. No pneumothorax, pulmonary edema, pleural effusion or confluent pulmonary opacity. Chronic bulky thoracic spine endplate spurring. No acute osseous abnormality identified. Bone mineralization appears normal. IMPRESSION: Stable radiographic appearance of the chest since 2014. No acute cardiopulmonary abnormality. Electronically Signed   By: Genevie Ann M.D.   On: 10/07/2014 16:37   Dg Thoracic Spine 2 View  10/07/2014  CLINICAL DATA:  69 year old male with cough, hypertension, former smoker, COPD. Radicular thoracic pain. Initial encounter. EXAM: THORACIC SPINE - 2 VIEW COMPARISON:  Chest radiographs from today and earlier. FINDINGS: Partially visible cervical ACDF hardware. Bone mineralization is within normal limits. Normal thoracic segmentation. Stable thoracic vertebral height and  alignment. Bulky and flowing mid and lower thoracic endplate osteophytes again noted. Posterior ribs appear grossly intact. Visualized lung parenchyma appears symmetric. IMPRESSION: No acute osseous abnormality identified in the thoracic spine. Electronically Signed   By: Genevie Ann M.D.   On: 10/07/2014 16:41    Assessment & Plan:   Melesio was seen today for hypertension.  Diagnoses and all orders for this visit:  Essential hypertension- he is not tolerating clonidine or carvedilol. He has sinus bradycardia which is symptomatic. His blood pressure is now well controlled on a loop diuretic and an ARB. Will continue that. Today I will monitor his electrolytes and renal function. -     CBC with Differential/Platelet; Future -     Comprehensive metabolic panel; Future -     Urinalysis, Routine w reflex microscopic (not at [facility]); Future -     EKG 12-Lead -     torsemide (DEMADEX) 10 MG tablet; Take 1 tablet (10 mg total) by mouth daily.  Hyperglycemia- he appears to have prediabetes, I will recheck his A1c today to see if  he has developed type 2 diabetes mellitus that requires therapy. -     Hemoglobin A1c; Future -     Comprehensive metabolic panel; Future  Hyperlipidemia -     Cancel: Lipid panel; Future -     TSH; Future  BPH (benign prostatic hyperplasia) -     Cancel: PSA; Future  Bradycardia- this is related to the beta blocker therapy and it is symptomatic. I will discontinue the beta blocker. If his heart rate and his symptoms do not improve then I will consider having an event monitor done to see if there is a dysrhythmia associated with this or to see if this bradycardia is severe enough that it needs to be considered for treatment with a pacemaker. -     EKG 12-Lead  Need for prophylactic vaccination against Streptococcus pneumoniae (pneumococcus) -     Pneumococcal conjugate vaccine 13-valent  I have discontinued Mr. Diodato's mupirocin ointment, cloNIDine, and carvedilol. I am also having him start on torsemide. Additionally, I am having him maintain his aspirin, multivitamin, vitamin C, GARLIC PO, SYMBICORT, and losartan.  Meds ordered this encounter  Medications  . torsemide (DEMADEX) 10 MG tablet    Sig: Take 1 tablet (10 mg total) by mouth daily.    Dispense:  90 tablet    Refill:  1     Follow-up: Return in about 3 months (around 06/17/2015).  Scarlette Calico, MD

## 2015-03-17 NOTE — Progress Notes (Signed)
Pre visit review using our clinic review tool, if applicable. No additional management support is needed unless otherwise documented below in the visit note. 

## 2015-03-18 ENCOUNTER — Encounter: Payer: Self-pay | Admitting: Internal Medicine

## 2015-03-18 LAB — CBC WITH DIFFERENTIAL/PLATELET
BASOS ABS: 0.1 10*3/uL (ref 0.0–0.1)
Basophils Relative: 1.4 % (ref 0.0–3.0)
Eosinophils Absolute: 0.1 10*3/uL (ref 0.0–0.7)
Eosinophils Relative: 2.3 % (ref 0.0–5.0)
HEMATOCRIT: 42.1 % (ref 39.0–52.0)
Hemoglobin: 13.9 g/dL (ref 13.0–17.0)
LYMPHS PCT: 27.1 % (ref 12.0–46.0)
Lymphs Abs: 1.7 10*3/uL (ref 0.7–4.0)
MCHC: 32.9 g/dL (ref 30.0–36.0)
MCV: 94.9 fl (ref 78.0–100.0)
Monocytes Absolute: 0.6 10*3/uL (ref 0.1–1.0)
Monocytes Relative: 9.7 % (ref 3.0–12.0)
NEUTROS PCT: 59.5 % (ref 43.0–77.0)
Neutro Abs: 3.8 10*3/uL (ref 1.4–7.7)
PLATELETS: 189 10*3/uL (ref 150.0–400.0)
RBC: 4.44 Mil/uL (ref 4.22–5.81)
RDW: 13.5 % (ref 11.5–15.5)
WBC: 6.4 10*3/uL (ref 4.0–10.5)

## 2015-03-18 MED ORDER — POTASSIUM CHLORIDE CRYS ER 20 MEQ PO TBCR: 20.0000 meq | EXTENDED_RELEASE_TABLET | Freq: Every day | ORAL | Status: DC

## 2015-03-24 ENCOUNTER — Telehealth: Payer: Self-pay | Admitting: Internal Medicine

## 2015-03-24 ENCOUNTER — Other Ambulatory Visit: Payer: Self-pay | Admitting: Internal Medicine

## 2015-03-24 DIAGNOSIS — I10 Essential (primary) hypertension: Secondary | ICD-10-CM

## 2015-03-24 MED ORDER — NEBIVOLOL HCL 2.5 MG PO TABS: 2.5000 mg | ORAL_TABLET | Freq: Every day | ORAL | Status: DC

## 2015-03-24 NOTE — Telephone Encounter (Signed)
Pt states yes. Please advise

## 2015-03-24 NOTE — Telephone Encounter (Signed)
Start bystolic, Rx sent in

## 2015-03-24 NOTE — Telephone Encounter (Signed)
Is he taking demadex?

## 2015-03-24 NOTE — Telephone Encounter (Signed)
Pt was seen on 03/17/2015. Please advise.

## 2015-03-24 NOTE — Telephone Encounter (Signed)
Pt's wife states his bp went down for a couple of days but is now running high again. She feels he needs something else to go with the losartan.  Pharmacy is [facility] on [address]. Can you please call

## 2015-03-24 NOTE — Telephone Encounter (Signed)
hes on losartan as well are we d/c or continue that ?

## 2015-03-25 NOTE — Telephone Encounter (Signed)
Yes, stay on the current meds and add bystolic to it

## 2015-03-26 NOTE — Telephone Encounter (Signed)
Pt informed

## 2015-04-23 ENCOUNTER — Telehealth: Payer: Self-pay | Admitting: *Deleted

## 2015-04-23 DIAGNOSIS — I10 Essential (primary) hypertension: Secondary | ICD-10-CM

## 2015-04-23 MED ORDER — NEBIVOLOL HCL 10 MG PO TABS: 10.0000 mg | ORAL_TABLET | Freq: Every day | ORAL | Status: DC

## 2015-04-23 MED ORDER — CLONIDINE HCL 0.3 MG PO TABS: 0.3000 mg | ORAL_TABLET | Freq: Two times a day (BID) | ORAL | Status: DC

## 2015-04-23 NOTE — Telephone Encounter (Signed)
rx sent

## 2015-04-23 NOTE — Telephone Encounter (Signed)
Notified pt wife md increase Bystolic to 10 mg rx sent to [facility].../lmb

## 2015-04-23 NOTE — Telephone Encounter (Signed)
Received call pt wife states husband BP still been running high anywhere from 150's-170's/ 70-100's. Currently taking Bystolic, Losartan, and Clonidine. As of today he is totally out on the clonidine will need refill if md want him to continue...Johny Chess

## 2015-04-23 NOTE — Telephone Encounter (Signed)
Called wife she stated he been taking Clonidine 0.3 mg for years now, not sure why it states that he is allergic to medicine. She states he was given conidine to take if BP was still elevated after taking the Losartan...Johny Chess

## 2015-04-23 NOTE — Telephone Encounter (Signed)
We have it listed that he is allergic to clonidine and it is not on his meds list - please clarify

## 2015-04-23 NOTE — Telephone Encounter (Signed)
done

## 2015-04-23 NOTE — Telephone Encounter (Signed)
Notified wife md sent refill on clonidine she is asking if the Bystolic need to be increase. The 2.5 mg is not helping BP...Johny Chess

## 2015-06-16 ENCOUNTER — Ambulatory Visit (INDEPENDENT_AMBULATORY_CARE_PROVIDER_SITE_OTHER): Payer: Medicare Other | Admitting: Internal Medicine

## 2015-06-16 ENCOUNTER — Encounter: Payer: Self-pay | Admitting: Internal Medicine

## 2015-06-16 VITALS — BP 120/86 | HR 82 | Temp 98.6°F | Resp 16 | Ht 68.0 in | Wt 163.0 lb

## 2015-06-16 DIAGNOSIS — I10 Essential (primary) hypertension: Secondary | ICD-10-CM | POA: Diagnosis not present

## 2015-06-16 MED ORDER — LOSARTAN POTASSIUM 100 MG PO TABS: ORAL_TABLET | ORAL | Status: DC

## 2015-06-16 MED ORDER — CLONIDINE HCL 0.3 MG PO TABS: 0.3000 mg | ORAL_TABLET | Freq: Two times a day (BID) | ORAL | Status: DC

## 2015-06-16 NOTE — Progress Notes (Signed)
Subjective:  Patient ID: Curtis Vance, male    DOB: 11/17/1943  Age: 70 y.o. MRN: [id number]  CC: Hypertension   HPI KOLTER BEACHUM presents for a follow-up on hypertension. He tells me his blood pressure has been well controlled and he has had no symptoms. When I last saw him he been placed on by systolic but he stopped taking it because he doesn't think it was helping him. He feels well today and offers no new complaints.  Outpatient Prescriptions Prior to Visit  Medication Sig Dispense Refill  . Ascorbic Acid (VITAMIN C) 1000 MG tablet Take 1,000 mg by mouth daily.    Marland Kitchen aspirin 81 MG tablet Take 81 mg by mouth daily.    Marland Kitchen GARLIC PO Take 1 capsule by mouth daily.    . Multiple Vitamin (MULTIVITAMIN) capsule Take 1 capsule by mouth daily.    . potassium chloride SA (K-DUR,KLOR-CON) 20 MEQ tablet Take 1 tablet (20 mEq total) by mouth daily. 90 tablet 1  . SYMBICORT 160-4.5 MCG/ACT inhaler TAKE 2 PUFFS FIRST THING IN THE MORNING & 2 PUFFS 12 HOURS LATER 10.2 Inhaler 5  . torsemide (DEMADEX) 10 MG tablet Take 1 tablet (10 mg total) by mouth daily. 90 tablet 1  . cloNIDine (CATAPRES) 0.3 MG tablet Take 1 tablet (0.3 mg total) by mouth 2 (two) times daily. 60 tablet 5  . losartan (COZAAR) 100 MG tablet TAKE 1 TABLET (100 MG TOTAL) BY MOUTH DAILY AT 12 NOON. 90 tablet 2  . nebivolol (BYSTOLIC) 10 MG tablet Take 1 tablet (10 mg total) by mouth daily. 30 tablet 11   No facility-administered medications prior to visit.    ROS Review of Systems  Constitutional: Negative.  Negative for fever, chills, diaphoresis, appetite change and fatigue.  HENT: Negative.   Eyes: Negative.  Negative for visual disturbance.  Respiratory: Negative.  Negative for cough, choking, chest tightness, shortness of breath and stridor.   Cardiovascular: Negative.  Negative for chest pain, palpitations and leg swelling.  Gastrointestinal: Negative.  Negative for nausea, vomiting, abdominal pain, diarrhea,  constipation and blood in stool.  Endocrine: Negative.   Genitourinary: Negative.   Musculoskeletal: Negative.   Skin: Negative.   Allergic/Immunologic: Negative.   Neurological: Negative.  Negative for dizziness, weakness, light-headedness and headaches.  Hematological: Negative.  Negative for adenopathy. Does not bruise/bleed easily.  Psychiatric/Behavioral: Negative.     Objective:  BP 120/86 mmHg  Pulse 82  Temp(Src) 98.6 F (37 C) (Oral)  Resp 16  Ht 5\' 8"  (1.727 m)  Wt 163 lb (73.936 kg)  BMI 24.79 kg/m2  SpO2 98%  BP Readings from Last 3 Encounters:  06/16/15 120/86  03/17/15 132/86  10/07/14 112/70    Wt Readings from Last 3 Encounters:  06/16/15 163 lb (73.936 kg)  03/17/15 164 lb 8 oz (74.617 kg)  10/07/14 163 lb (73.936 kg)    Physical Exam  Constitutional: He is oriented to person, place, and time. No distress.  HENT:  Mouth/Throat: Oropharynx is clear and moist. No oropharyngeal exudate.  Eyes: Conjunctivae are normal. Right eye exhibits no discharge. Left eye exhibits no discharge. No scleral icterus.  Neck: Normal range of motion. Neck supple. No JVD present. No tracheal deviation present. No thyromegaly present.  Cardiovascular: Normal rate, regular rhythm, normal heart sounds and intact distal pulses.  Exam reveals no gallop and no friction rub.   No murmur heard. Pulmonary/Chest: Effort normal and breath sounds normal. No stridor. No respiratory distress. He has  no wheezes. He has no rales. He exhibits no tenderness.  Abdominal: Soft. Bowel sounds are normal. He exhibits no distension and no mass. There is no tenderness. There is no rebound and no guarding.  Musculoskeletal: Normal range of motion. He exhibits no edema or tenderness.  Lymphadenopathy:    He has no cervical adenopathy.  Neurological: He is oriented to person, place, and time.  Skin: Skin is warm and dry. No rash noted. He is not diaphoretic. No erythema. No pallor.  Vitals  reviewed.   Lab Results  Component Value Date   WBC 6.4 03/17/2015   HGB 13.9 03/17/2015   HCT 42.1 03/17/2015   PLT 189.0 03/17/2015   GLUCOSE 96 03/17/2015   CHOL 178 03/29/2014   TRIG 70.0 03/29/2014   HDL 63.70 03/29/2014   LDLCALC 100* 03/29/2014   ALT 16 03/17/2015   AST 25 03/17/2015   NA 135 03/17/2015   K 4.4 03/17/2015   CL 100 03/17/2015   CREATININE 0.96 03/17/2015   BUN 12 03/17/2015   CO2 30 03/17/2015   TSH 1.34 03/17/2015   PSA 2.10 10/03/2008   HGBA1C 5.7 03/17/2015    Dg Chest 2 View  10/07/2014  CLINICAL DATA:  70 year old male with cough, hypertension, former smoker, COPD. Radicular thoracic pain. Initial encounter. EXAM: CHEST  2 VIEW COMPARISON:  Chest radiographs 01/15/2013. FINDINGS: Small chronic retained metallic foreign bodies about the left shoulder, including along the undersurface of the left clavicle. Partially visible cervical ACDF hardware. Stable lung volumes since 2014. Normal cardiac size and mediastinal contours. Visualized tracheal air column is within normal limits. No pneumothorax, pulmonary edema, pleural effusion or confluent pulmonary opacity. Chronic bulky thoracic spine endplate spurring. No acute osseous abnormality identified. Bone mineralization appears normal. IMPRESSION: Stable radiographic appearance of the chest since 2014. No acute cardiopulmonary abnormality. Electronically Signed   By: Genevie Ann M.D.   On: 10/07/2014 16:37   Dg Thoracic Spine 2 View  10/07/2014  CLINICAL DATA:  70 year old male with cough, hypertension, former smoker, COPD. Radicular thoracic pain. Initial encounter. EXAM: THORACIC SPINE - 2 VIEW COMPARISON:  Chest radiographs from today and earlier. FINDINGS: Partially visible cervical ACDF hardware. Bone mineralization is within normal limits. Normal thoracic segmentation. Stable thoracic vertebral height and alignment. Bulky and flowing mid and lower thoracic endplate osteophytes again noted. Posterior ribs appear  grossly intact. Visualized lung parenchyma appears symmetric. IMPRESSION: No acute osseous abnormality identified in the thoracic spine. Electronically Signed   By: Genevie Ann M.D.   On: 10/07/2014 16:41    Assessment & Plan:   Huntington was seen today for hypertension.  Diagnoses and all orders for this visit:  Essential hypertension- despite not taking Bystolic his blood pressure is still well controlled, he will continue the current medication regimen. -     cloNIDine (CATAPRES) 0.3 MG tablet; Take 1 tablet (0.3 mg total) by mouth 2 (two) times daily. -     losartan (COZAAR) 100 MG tablet; TAKE 1 TABLET (100 MG TOTAL) BY MOUTH DAILY AT 12 NOON.  I have discontinued Mr. Norsworthy's nebivolol. I am also having him maintain his aspirin, multivitamin, vitamin C, GARLIC PO, SYMBICORT, torsemide, potassium chloride SA, cloNIDine, and losartan.  Meds ordered this encounter  Medications  . cloNIDine (CATAPRES) 0.3 MG tablet    Sig: Take 1 tablet (0.3 mg total) by mouth 2 (two) times daily.    Dispense:  180 tablet    Refill:  1  . losartan (COZAAR) 100 MG tablet  Sig: TAKE 1 TABLET (100 MG TOTAL) BY MOUTH DAILY AT 12 NOON.    Dispense:  90 tablet    Refill:  2     Follow-up: Return in about 6 months (around 12/14/2015).  Scarlette Calico, MD

## 2015-06-16 NOTE — Progress Notes (Signed)
Pre visit review using our clinic review tool, if applicable. No additional management support is needed unless otherwise documented below in the visit note. 

## 2015-06-16 NOTE — Patient Instructions (Signed)
Hypertension Hypertension, commonly called high blood pressure, is when the force of blood pumping through your arteries is too strong. Your arteries are the blood vessels that carry blood from your heart throughout your body. A blood pressure reading consists of a higher number over a lower number, such as 110/72. The higher number (systolic) is the pressure inside your arteries when your heart pumps. The lower number (diastolic) is the pressure inside your arteries when your heart relaxes. Ideally you want your blood pressure below 120/80. Hypertension forces your heart to work harder to pump blood. Your arteries may become narrow or stiff. Having untreated or uncontrolled hypertension can cause heart attack, stroke, kidney disease, and other problems. RISK FACTORS Some risk factors for high blood pressure are controllable. Others are not.  Risk factors you cannot control include:   Race. You may be at higher risk if you are African American.  Age. Risk increases with age.  Gender. Men are at higher risk than women before age 45 years. After age 65, women are at higher risk than men. Risk factors you can control include:  Not getting enough exercise or physical activity.  Being overweight.  Getting too much fat, sugar, calories, or salt in your diet.  Drinking too much alcohol. SIGNS AND SYMPTOMS Hypertension does not usually cause signs or symptoms. Extremely high blood pressure (hypertensive crisis) may cause headache, anxiety, shortness of breath, and nosebleed. DIAGNOSIS To check if you have hypertension, your health care provider will measure your blood pressure while you are seated, with your arm held at the level of your heart. It should be measured at least twice using the same arm. Certain conditions can cause a difference in blood pressure between your right and left arms. A blood pressure reading that is higher than normal on one occasion does not mean that you need treatment. If  it is not clear whether you have high blood pressure, you may be asked to return on a different day to have your blood pressure checked again. Or, you may be asked to monitor your blood pressure at home for 1 or more weeks. TREATMENT Treating high blood pressure includes making lifestyle changes and possibly taking medicine. Living a healthy lifestyle can help lower high blood pressure. You may need to change some of your habits. Lifestyle changes may include:  Following the DASH diet. This diet is high in fruits, vegetables, and whole grains. It is low in salt, red meat, and added sugars.  Keep your sodium intake below 2,300 mg per day.  Getting at least 30-45 minutes of aerobic exercise at least 4 times per week.  Losing weight if necessary.  Not smoking.  Limiting alcoholic beverages.  Learning ways to reduce stress. Your health care provider may prescribe medicine if lifestyle changes are not enough to get your blood pressure under control, and if one of the following is true:  You are 18-59 years of age and your systolic blood pressure is above 140.  You are 60 years of age or older, and your systolic blood pressure is above 150.  Your diastolic blood pressure is above 90.  You have diabetes, and your systolic blood pressure is over 140 or your diastolic blood pressure is over 90.  You have kidney disease and your blood pressure is above 140/90.  You have heart disease and your blood pressure is above 140/90. Your personal target blood pressure may vary depending on your medical conditions, your age, and other factors. HOME CARE INSTRUCTIONS    Have your blood pressure rechecked as directed by your health care provider.   Take medicines only as directed by your health care provider. Follow the directions carefully. Blood pressure medicines must be taken as prescribed. The medicine does not work as well when you skip doses. Skipping doses also puts you at risk for  problems.  Do not smoke.   Monitor your blood pressure at home as directed by your health care provider. SEEK MEDICAL CARE IF:   You think you are having a reaction to medicines taken.  You have recurrent headaches or feel dizzy.  You have swelling in your ankles.  You have trouble with your vision. SEEK IMMEDIATE MEDICAL CARE IF:  You develop a severe headache or confusion.  You have unusual weakness, numbness, or feel faint.  You have severe chest or abdominal pain.  You vomit repeatedly.  You have trouble breathing. MAKE SURE YOU:   Understand these instructions.  Will watch your condition.  Will get help right away if you are not doing well or get worse.   This information is not intended to replace advice given to you by your health care provider. Make sure you discuss any questions you have with your health care provider.   Document Released: 04/12/2005 Document Revised: 08/27/2014 Document Reviewed: 02/02/2013 Elsevier Interactive Patient Education 2016 [employer]  

## 2015-08-06 ENCOUNTER — Other Ambulatory Visit: Payer: Self-pay | Admitting: Internal Medicine

## 2015-10-02 ENCOUNTER — Other Ambulatory Visit: Payer: Self-pay | Admitting: Internal Medicine

## 2015-11-21 ENCOUNTER — Other Ambulatory Visit: Payer: Self-pay | Admitting: Internal Medicine

## 2015-11-21 DIAGNOSIS — I10 Essential (primary) hypertension: Secondary | ICD-10-CM

## 2015-12-11 ENCOUNTER — Other Ambulatory Visit: Payer: Self-pay | Admitting: Internal Medicine

## 2015-12-15 ENCOUNTER — Encounter: Payer: Self-pay | Admitting: Internal Medicine

## 2015-12-15 ENCOUNTER — Ambulatory Visit (INDEPENDENT_AMBULATORY_CARE_PROVIDER_SITE_OTHER): Payer: Medicare Other | Admitting: Internal Medicine

## 2015-12-15 ENCOUNTER — Other Ambulatory Visit (INDEPENDENT_AMBULATORY_CARE_PROVIDER_SITE_OTHER): Payer: Medicare Other

## 2015-12-15 VITALS — BP 118/82 | HR 74 | Temp 97.8°F | Resp 16 | Ht 68.0 in | Wt 153.5 lb

## 2015-12-15 DIAGNOSIS — Z0000 Encounter for general adult medical examination without abnormal findings: Secondary | ICD-10-CM | POA: Insufficient documentation

## 2015-12-15 DIAGNOSIS — I10 Essential (primary) hypertension: Secondary | ICD-10-CM

## 2015-12-15 DIAGNOSIS — R739 Hyperglycemia, unspecified: Secondary | ICD-10-CM

## 2015-12-15 DIAGNOSIS — N400 Enlarged prostate without lower urinary tract symptoms: Secondary | ICD-10-CM | POA: Diagnosis not present

## 2015-12-15 DIAGNOSIS — E785 Hyperlipidemia, unspecified: Secondary | ICD-10-CM

## 2015-12-15 LAB — CBC WITH DIFFERENTIAL/PLATELET
Basophils Absolute: 0.1 10*3/uL (ref 0.0–0.1)
Basophils Relative: 1.7 % (ref 0.0–3.0)
Eosinophils Absolute: 0.2 10*3/uL (ref 0.0–0.7)
Eosinophils Relative: 3.2 % (ref 0.0–5.0)
HCT: 39.6 % (ref 39.0–52.0)
Hemoglobin: 13.6 g/dL (ref 13.0–17.0)
Lymphocytes Relative: 25.9 % (ref 12.0–46.0)
Lymphs Abs: 1.4 10*3/uL (ref 0.7–4.0)
MCHC: 34.3 g/dL (ref 30.0–36.0)
MCV: 95.2 fl (ref 78.0–100.0)
MONOS PCT: 12.1 % — AB (ref 3.0–12.0)
Monocytes Absolute: 0.7 10*3/uL (ref 0.1–1.0)
NEUTROS ABS: 3.1 10*3/uL (ref 1.4–7.7)
NEUTROS PCT: 57.1 % (ref 43.0–77.0)
PLATELETS: 222 10*3/uL (ref 150.0–400.0)
RBC: 4.16 Mil/uL — ABNORMAL LOW (ref 4.22–5.81)
RDW: 14 % (ref 11.5–15.5)
WBC: 5.5 10*3/uL (ref 4.0–10.5)

## 2015-12-15 LAB — URINALYSIS, ROUTINE W REFLEX MICROSCOPIC
Bilirubin Urine: NEGATIVE
Hgb urine dipstick: NEGATIVE
Ketones, ur: NEGATIVE
Nitrite: NEGATIVE
RBC / HPF: NONE SEEN (ref 0–?)
SPECIFIC GRAVITY, URINE: 1.01 (ref 1.000–1.030)
Total Protein, Urine: NEGATIVE
Urine Glucose: NEGATIVE
Urobilinogen, UA: 0.2 (ref 0.0–1.0)
pH: 6 (ref 5.0–8.0)

## 2015-12-15 LAB — TSH: TSH: 0.81 u[IU]/mL (ref 0.35–4.50)

## 2015-12-15 LAB — COMPREHENSIVE METABOLIC PANEL
ALT: 15 U/L (ref 0–53)
AST: 28 U/L (ref 0–37)
Albumin: 4.1 g/dL (ref 3.5–5.2)
Alkaline Phosphatase: 59 U/L (ref 39–117)
BUN: 14 mg/dL (ref 6–23)
CO2: 28 meq/L (ref 19–32)
Calcium: 9.1 mg/dL (ref 8.4–10.5)
Chloride: 102 mEq/L (ref 96–112)
Creatinine, Ser: 1.01 mg/dL (ref 0.40–1.50)
GFR: 92.85 mL/min (ref >60.00)
GLUCOSE: 82 mg/dL (ref 70–99)
Potassium: 4.2 mEq/L (ref 3.5–5.1)
Sodium: 137 mEq/L (ref 135–145)
Total Bilirubin: 1 mg/dL (ref 0.2–1.2)
Total Protein: 6.7 g/dL (ref 6.0–8.3)

## 2015-12-15 LAB — LIPID PANEL
CHOL/HDL RATIO: 3
Cholesterol: 171 mg/dL (ref 0–200)
HDL: 61.2 mg/dL (ref >39.00)
LDL Cholesterol: 96 mg/dL (ref 0–99)
NONHDL: 109.81
Triglycerides: 70 mg/dL (ref 0.0–149.0)
VLDL: 14 mg/dL (ref 0.0–40.0)

## 2015-12-15 LAB — PSA: PSA: 2.82 ng/mL (ref 0.10–4.00)

## 2015-12-15 LAB — HEMOGLOBIN A1C: Hgb A1c MFr Bld: 5.1 % (ref 4.6–6.5)

## 2015-12-15 LAB — FECAL OCCULT BLOOD, GUAIAC: Fecal Occult Blood: NEGATIVE

## 2015-12-15 NOTE — Progress Notes (Signed)
Pre visit review using our clinic review tool, if applicable. No additional management support is needed unless otherwise documented below in the visit note. 

## 2015-12-15 NOTE — Patient Instructions (Signed)
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
## 2015-12-16 NOTE — Assessment & Plan Note (Signed)

## 2015-12-22 ENCOUNTER — Other Ambulatory Visit: Payer: Self-pay | Admitting: Internal Medicine

## 2015-12-22 DIAGNOSIS — N400 Enlarged prostate without lower urinary tract symptoms: Secondary | ICD-10-CM

## 2015-12-22 MED ORDER — SILODOSIN 4 MG PO CAPS: 4.0000 mg | ORAL_CAPSULE | Freq: Every day | ORAL | 11 refills | Status: DC

## 2015-12-22 NOTE — Telephone Encounter (Signed)
Patients wife called to advise that there is difficulty urinating (delay and slowness)

## 2015-12-22 NOTE — Telephone Encounter (Signed)
Called to advise. patient wife states that he had taken this med in the past and he was not able to take it. She states that it did not agree with him, but could not remember any specific SX.

## 2015-12-23 ENCOUNTER — Encounter: Payer: Self-pay | Admitting: Internal Medicine

## 2015-12-23 ENCOUNTER — Telehealth: Payer: Self-pay | Admitting: Internal Medicine
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
## 2015-12-26 ENCOUNTER — Telehealth: Payer: Self-pay | Admitting: *Deleted

## 2015-12-26 MED ORDER — TAMSULOSIN HCL 0.4 MG PO CAPS: 0.4000 mg | ORAL_CAPSULE | Freq: Every day | ORAL | 3 refills | Status: DC

## 2015-12-26 NOTE — Telephone Encounter (Signed)
Pt wife left msg on triage stating husband was rx 36, but med is too expensive. Urine showed that he had some WBC in it. She is ? If he need to be taking anything for urinary infection. Md is out of office pls advise...Johny Chess

## 2015-12-26 NOTE — Telephone Encounter (Signed)
Notified pt/wife w/MD response../lmb 

## 2015-12-26 NOTE — Telephone Encounter (Signed)
If he is not having symptoms of UTI he does not need to be treated. Have sent in tamsulosin instead. He should take 1 pill daily.

## 2016-01-29 ENCOUNTER — Other Ambulatory Visit: Payer: Self-pay | Admitting: Internal Medicine

## 2016-02-04 ENCOUNTER — Other Ambulatory Visit: Payer: Self-pay | Admitting: Internal Medicine

## 2016-02-04 DIAGNOSIS — I10 Essential (primary) hypertension: Secondary | ICD-10-CM

## 2016-02-19 ENCOUNTER — Ambulatory Visit (INDEPENDENT_AMBULATORY_CARE_PROVIDER_SITE_OTHER): Payer: Medicare Other

## 2016-02-19 ENCOUNTER — Other Ambulatory Visit: Payer: Self-pay | Admitting: Internal Medicine

## 2016-02-19 DIAGNOSIS — I10 Essential (primary) hypertension: Secondary | ICD-10-CM

## 2016-02-19 DIAGNOSIS — Z23 Encounter for immunization: Secondary | ICD-10-CM | POA: Diagnosis not present

## 2016-03-08 ENCOUNTER — Other Ambulatory Visit: Payer: Self-pay | Admitting: Internal Medicine

## 2016-03-08 DIAGNOSIS — I10 Essential (primary) hypertension: Secondary | ICD-10-CM

## 2016-03-29 ENCOUNTER — Other Ambulatory Visit (INDEPENDENT_AMBULATORY_CARE_PROVIDER_SITE_OTHER): Payer: Medicare Other

## 2016-03-29 ENCOUNTER — Ambulatory Visit (INDEPENDENT_AMBULATORY_CARE_PROVIDER_SITE_OTHER): Payer: Medicare Other | Admitting: Internal Medicine

## 2016-03-29 ENCOUNTER — Encounter: Payer: Self-pay | Admitting: Internal Medicine

## 2016-03-29 VITALS — BP 150/100 | HR 50 | Temp 97.8°F | Resp 16 | Ht 68.0 in | Wt 165.5 lb

## 2016-03-29 DIAGNOSIS — I10 Essential (primary) hypertension: Secondary | ICD-10-CM | POA: Diagnosis not present

## 2016-03-29 DIAGNOSIS — H9312 Tinnitus, left ear: Secondary | ICD-10-CM | POA: Diagnosis not present

## 2016-03-29 LAB — BASIC METABOLIC PANEL
BUN: 15 mg/dL (ref 6–23)
CHLORIDE: 104 meq/L (ref 96–112)
CO2: 30 mEq/L (ref 19–32)
Calcium: 9.4 mg/dL (ref 8.4–10.5)
Creatinine, Ser: 1.05 mg/dL (ref 0.40–1.50)
GFR: 88.71 mL/min (ref >60.00)
Glucose, Bld: 97 mg/dL (ref 70–99)
POTASSIUM: 4.6 meq/L (ref 3.5–5.1)
Sodium: 139 mEq/L (ref 135–145)

## 2016-03-29 MED ORDER — CLONIDINE HCL 0.3 MG/24HR TD PTWK: 0.3000 mg | MEDICATED_PATCH | TRANSDERMAL | 12 refills | Status: DC

## 2016-03-29 MED ORDER — POTASSIUM CHLORIDE CRYS ER 20 MEQ PO TBCR: EXTENDED_RELEASE_TABLET | ORAL | 1 refills | Status: DC

## 2016-03-29 MED ORDER — CHLORTHALIDONE 25 MG PO TABS: 25.0000 mg | ORAL_TABLET | Freq: Every day | ORAL | 1 refills | Status: DC

## 2016-03-29 NOTE — Progress Notes (Signed)
Pre visit review using our clinic review tool, if applicable. No additional management support is needed unless otherwise documented below in the visit note. 

## 2016-03-29 NOTE — Progress Notes (Signed)
Subjective:  Patient ID: Curtis Vance, male    DOB: 11/17/1943  Age: 70 y.o. MRN: [id number]  CC: Hypertension   HPI Curtis Vance presents for follow-up on high blood pressure. He tells me his blood pressure has not been well controlled with the loop diuretic and he complains of having difficulty taking the clonidine so frequently. Fortunately, he denies any symptoms such as headache/blurred vision/chest pain/shortness of breath/or edema.  He complains of ringing in his left ear with a slight decrease in hearing. He has never had his hearing tested.  Outpatient Medications Prior to Visit  Medication Sig Dispense Refill  . Ascorbic Acid (VITAMIN C) 1000 MG tablet Take 1,000 mg by mouth daily.    Marland Kitchen aspirin 81 MG tablet Take 81 mg by mouth daily.    Marland Kitchen GARLIC PO Take 1 capsule by mouth daily.    Marland Kitchen losartan (COZAAR) 100 MG tablet TAKE 1 TABLET BY MOUTH DAILY AT NOON 90 tablet 1  . SYMBICORT 160-4.5 MCG/ACT inhaler INHALE 2 PUFFS INTO THE LUNGS EVERY MORNING AND 2 PUFFS 12 HOURS LATER 10.2 Inhaler 11  . tamsulosin (FLOMAX) 0.4 MG CAPS capsule Take 1 capsule (0.4 mg total) by mouth daily. 30 capsule 3  . cloNIDine (CATAPRES) 0.3 MG tablet TAKE 1 TABLET BY MOUTH TWICE A DAY 180 tablet 1  . KLOR-CON M20 20 MEQ tablet TAKE 1 TABLET (20 MEQ TOTAL) BY MOUTH DAILY. 90 tablet 1  . Multiple Vitamin (MULTIVITAMIN) capsule Take 1 capsule by mouth daily.    Marland Kitchen torsemide (DEMADEX) 10 MG tablet TAKE 1 TABLET (10 MG TOTAL) BY MOUTH DAILY. 90 tablet 1   No facility-administered medications prior to visit.     ROS Review of Systems  Constitutional: Negative for appetite change, diaphoresis and fatigue.  HENT: Positive for tinnitus. Negative for ear discharge, ear pain, postnasal drip, sinus pain, sinus pressure, sneezing, sore throat, trouble swallowing and voice change.   Eyes: Negative.  Negative for photophobia and visual disturbance.  Respiratory: Negative for cough, chest tightness, shortness  of breath and stridor.   Cardiovascular: Negative for chest pain, palpitations and leg swelling.  Gastrointestinal: Negative for abdominal pain, blood in stool, constipation, diarrhea, nausea and vomiting.  Genitourinary: Negative.  Negative for difficulty urinating, dysuria, hematuria and urgency.  Musculoskeletal: Negative.  Negative for back pain and neck pain.  Skin: Negative.  Negative for color change and rash.  Neurological: Negative.  Negative for dizziness, weakness, light-headedness and headaches.  Hematological: Negative.  Negative for adenopathy. Does not bruise/bleed easily.  Psychiatric/Behavioral: Negative.     Objective:  BP (!) 150/100 (BP Location: Right Arm, Patient Position: Sitting, Cuff Size: Normal)   Pulse (!) 50   Temp 97.8 F (36.6 C) (Oral)   Resp 16   Ht 5\' 8"  (1.727 m)   Wt 165 lb 8 oz (75.1 kg)   SpO2 94%   BMI 25.16 kg/m   BP Readings from Last 3 Encounters:  03/29/16 (!) 150/100  12/15/15 118/82  06/16/15 120/86    Wt Readings from Last 3 Encounters:  03/29/16 165 lb 8 oz (75.1 kg)  12/15/15 153 lb 8 oz (69.6 kg)  06/16/15 163 lb (73.9 kg)    Physical Exam  Constitutional: He is oriented to person, place, and time. No distress.  HENT:  Mouth/Throat: Oropharynx is clear and moist. No oropharyngeal exudate.  Eyes: Conjunctivae are normal. Right eye exhibits no discharge. Left eye exhibits no discharge. No scleral icterus.  Neck: Normal range  of motion. Neck supple. No JVD present. No tracheal deviation present. No thyromegaly present.  Cardiovascular: Normal rate, regular rhythm, normal heart sounds and intact distal pulses.  Exam reveals no gallop and no friction rub.   No murmur heard. Pulmonary/Chest: Effort normal and breath sounds normal. No stridor. No respiratory distress. He has no wheezes. He has no rales. He exhibits no tenderness.  Abdominal: Soft. Bowel sounds are normal. He exhibits no distension and no mass. There is no  tenderness. There is no rebound and no guarding.  Musculoskeletal: Normal range of motion. He exhibits no edema, tenderness or deformity.  Lymphadenopathy:    He has no cervical adenopathy.  Neurological: He is oriented to person, place, and time.  Skin: Skin is warm and dry. No rash noted. He is not diaphoretic. No erythema. No pallor.  Vitals reviewed.   Lab Results  Component Value Date   WBC 5.5 12/15/2015   HGB 13.6 12/15/2015   HCT 39.6 12/15/2015   PLT 222.0 12/15/2015   GLUCOSE 97 03/29/2016   CHOL 171 12/15/2015   TRIG 70.0 12/15/2015   HDL 61.20 12/15/2015   LDLCALC 96 12/15/2015   ALT 15 12/15/2015   AST 28 12/15/2015   NA 139 03/29/2016   K 4.6 03/29/2016   CL 104 03/29/2016   CREATININE 1.05 03/29/2016   BUN 15 03/29/2016   CO2 30 03/29/2016   TSH 0.81 12/15/2015   PSA 2.82 12/15/2015   HGBA1C 5.1 12/15/2015    Dg Chest 2 View  Result Date: 10/07/2014 CLINICAL DATA:  70 year old male with cough, hypertension, former smoker, COPD. Radicular thoracic pain. Initial encounter. EXAM: CHEST  2 VIEW COMPARISON:  Chest radiographs 01/15/2013. FINDINGS: Small chronic retained metallic foreign bodies about the left shoulder, including along the undersurface of the left clavicle. Partially visible cervical ACDF hardware. Stable lung volumes since 2014. Normal cardiac size and mediastinal contours. Visualized tracheal air column is within normal limits. No pneumothorax, pulmonary edema, pleural effusion or confluent pulmonary opacity. Chronic bulky thoracic spine endplate spurring. No acute osseous abnormality identified. Bone mineralization appears normal. IMPRESSION: Stable radiographic appearance of the chest since 2014. No acute cardiopulmonary abnormality. Electronically Signed   By: Genevie Ann M.D.   On: 10/07/2014 16:37   Dg Thoracic Spine 2 View  Result Date: 10/07/2014 CLINICAL DATA:  70 year old male with cough, hypertension, former smoker, COPD. Radicular thoracic  pain. Initial encounter. EXAM: THORACIC SPINE - 2 VIEW COMPARISON:  Chest radiographs from today and earlier. FINDINGS: Partially visible cervical ACDF hardware. Bone mineralization is within normal limits. Normal thoracic segmentation. Stable thoracic vertebral height and alignment. Bulky and flowing mid and lower thoracic endplate osteophytes again noted. Posterior ribs appear grossly intact. Visualized lung parenchyma appears symmetric. IMPRESSION: No acute osseous abnormality identified in the thoracic spine. Electronically Signed   By: Genevie Ann M.D.   On: 10/07/2014 16:41    Assessment & Plan:   Kyndel was seen today for hypertension.  Diagnoses and all orders for this visit:  Essential hypertension- his blood pressure is not adequately well controlled with a loop diuretic and the current dose of clonidine. For compliance and convenience I will change him to a clonidine patch. Will discontinue the loop diuretic and change to a more potent thiazide diuretic for better blood pressure control. Will start potassium replacement therapy. -     potassium chloride SA (KLOR-CON M20) 20 MEQ tablet; TAKE 1 TABLET (20 MEQ TOTAL) BY MOUTH DAILY. -     chlorthalidone (HYGROTON)  25 MG tablet; Take 1 tablet (25 mg total) by mouth daily. -     Basic metabolic panel; Future -     cloNIDine (CATAPRES - DOSED IN MG/24 HR) 0.3 mg/24hr patch; Place 1 patch (0.3 mg total) onto the skin once a week.  Tinnitus of left ear- I've asked him to see audiology to be screened for hearing loss. -     Ambulatory referral to Audiology   I have discontinued Mr. Falck's multivitamin, cloNIDine, and torsemide. I have also changed his KLOR-CON M20 to potassium chloride SA. Additionally, I am having him start on chlorthalidone and cloNIDine. Lastly, I am having him maintain his aspirin, vitamin C, GARLIC PO, tamsulosin, SYMBICORT, and losartan.  Meds ordered this encounter  Medications  . potassium chloride SA (KLOR-CON M20)  20 MEQ tablet    Sig: TAKE 1 TABLET (20 MEQ TOTAL) BY MOUTH DAILY.    Dispense:  90 tablet    Refill:  1  . chlorthalidone (HYGROTON) 25 MG tablet    Sig: Take 1 tablet (25 mg total) by mouth daily.    Dispense:  90 tablet    Refill:  1  . cloNIDine (CATAPRES - DOSED IN MG/24 HR) 0.3 mg/24hr patch    Sig: Place 1 patch (0.3 mg total) onto the skin once a week.    Dispense:  4 patch    Refill:  12     Follow-up: Return in about 2 months (around 05/30/2016).  Scarlette Calico, MD

## 2016-03-29 NOTE — Patient Instructions (Signed)
Hypertension Hypertension, commonly called high blood pressure, is when the force of blood pumping through your arteries is too strong. Your arteries are the blood vessels that carry blood from your heart throughout your body. A blood pressure reading consists of a higher number over a lower number, such as 110/72. The higher number (systolic) is the pressure inside your arteries when your heart pumps. The lower number (diastolic) is the pressure inside your arteries when your heart relaxes. Ideally you want your blood pressure below 120/80. Hypertension forces your heart to work harder to pump blood. Your arteries may become narrow or stiff. Having untreated or uncontrolled hypertension can cause heart attack, stroke, kidney disease, and other problems. What increases the risk? Some risk factors for high blood pressure are controllable. Others are not. Risk factors you cannot control include:  Race. You may be at higher risk if you are African American.  Age. Risk increases with age.  Gender. Men are at higher risk than women before age 45 years. After age 65, women are at higher risk than men. Risk factors you can control include:  Not getting enough exercise or physical activity.  Being overweight.  Getting too much fat, sugar, calories, or salt in your diet.  Drinking too much alcohol. What are the signs or symptoms? Hypertension does not usually cause signs or symptoms. Extremely high blood pressure (hypertensive crisis) may cause headache, anxiety, shortness of breath, and nosebleed. How is this diagnosed? To check if you have hypertension, your health care provider will measure your blood pressure while you are seated, with your arm held at the level of your heart. It should be measured at least twice using the same arm. Certain conditions can cause a difference in blood pressure between your right and left arms. A blood pressure reading that is higher than normal on one occasion does  not mean that you need treatment. If it is not clear whether you have high blood pressure, you may be asked to return on a different day to have your blood pressure checked again. Or, you may be asked to monitor your blood pressure at home for 1 or more weeks. How is this treated? Treating high blood pressure includes making lifestyle changes and possibly taking medicine. Living a healthy lifestyle can help lower high blood pressure. You may need to change some of your habits. Lifestyle changes may include:  Following the DASH diet. This diet is high in fruits, vegetables, and whole grains. It is low in salt, red meat, and added sugars.  Keep your sodium intake below 2,300 mg per day.  Getting at least 30-45 minutes of aerobic exercise at least 4 times per week.  Losing weight if necessary.  Not smoking.  Limiting alcoholic beverages.  Learning ways to reduce stress. Your health care provider may prescribe medicine if lifestyle changes are not enough to get your blood pressure under control, and if one of the following is true:  You are 18-59 years of age and your systolic blood pressure is above 140.  You are 60 years of age or older, and your systolic blood pressure is above 150.  Your diastolic blood pressure is above 90.  You have diabetes, and your systolic blood pressure is over 140 or your diastolic blood pressure is over 90.  You have kidney disease and your blood pressure is above 140/90.  You have heart disease and your blood pressure is above 140/90. Your personal target blood pressure may vary depending on your medical   conditions, your age, and other factors. Follow these instructions at home:  Have your blood pressure rechecked as directed by your health care provider.  Take medicines only as directed by your health care provider. Follow the directions carefully. Blood pressure medicines must be taken as prescribed. The medicine does not work as well when you skip  doses. Skipping doses also puts you at risk for problems.  Do not smoke.  Monitor your blood pressure at home as directed by your health care provider. Contact a health care provider if:  You think you are having a reaction to medicines taken.  You have recurrent headaches or feel dizzy.  You have swelling in your ankles.  You have trouble with your vision. Get help right away if:  You develop a severe headache or confusion.  You have unusual weakness, numbness, or feel faint.  You have severe chest or abdominal pain.  You vomit repeatedly.  You have trouble breathing. This information is not intended to replace advice given to you by your health care provider. Make sure you discuss any questions you have with your health care provider. Document Released: 04/12/2005 Document Revised: 09/18/2015 Document Reviewed: 02/02/2013 Elsevier Interactive Patient Education  2017 [employer]  

## 2016-04-06 ENCOUNTER — Telehealth: Payer: Self-pay | Admitting: *Deleted

## 2016-04-06 NOTE — Telephone Encounter (Signed)
Pt wife left msg on triage stating MD rx clonidine patches, but its not keeping his BP down 165/87, 157/87, 158/88, 175/90, 167/89, 176/90, on Sunday it was 202/92. Took a clonidine pill and it came down some. This am 169/101 after taking clonidine 145/80...Curtis Vance

## 2016-04-07 ENCOUNTER — Other Ambulatory Visit: Payer: Self-pay | Admitting: Internal Medicine

## 2016-04-07 DIAGNOSIS — I10 Essential (primary) hypertension: Secondary | ICD-10-CM

## 2016-04-07 MED ORDER — CLONIDINE HCL 0.1 MG PO TABS: 0.1000 mg | ORAL_TABLET | Freq: Three times a day (TID) | ORAL | 3 refills | Status: DC

## 2016-04-07 NOTE — Telephone Encounter (Signed)
Give the patch more time to work

## 2016-04-07 NOTE — Telephone Encounter (Signed)
Notified pt/wife w/MD response. She states they will continue to use the patch, but what can he take extra to try to get it down. He was taking additional clonidine pill, but he has ran out. She is asking for refill on the clonidine pill just to take as needed when BP is high ...Johny Chess

## 2016-04-07 NOTE — Telephone Encounter (Signed)
Notified pt wife rx sent to [facility].../lmb

## 2016-04-07 NOTE — Telephone Encounter (Signed)
rx sent

## 2016-05-31 ENCOUNTER — Encounter: Payer: Self-pay | Admitting: Internal Medicine

## 2016-05-31 ENCOUNTER — Ambulatory Visit (INDEPENDENT_AMBULATORY_CARE_PROVIDER_SITE_OTHER): Payer: Medicare Other | Admitting: Internal Medicine

## 2016-05-31 VITALS — BP 140/90 | HR 81 | Temp 97.9°F | Resp 16 | Ht 68.0 in | Wt 165.0 lb

## 2016-05-31 DIAGNOSIS — I10 Essential (primary) hypertension: Secondary | ICD-10-CM | POA: Diagnosis not present

## 2016-05-31 NOTE — Progress Notes (Signed)
Subjective:  Patient ID: Curtis Vance, male    DOB: 11/17/1943  Age: 71 y.o. MRN: [id number]  CC: Hypertension   HPI Curtis Vance presents for a blood pressure check. He tells me his blood pressure is well-controlled on the combination of clonidine, chlorthalidone, and losartan. He feels well today and offers no complaints.  Outpatient Medications Prior to Visit  Medication Sig Dispense Refill  . Ascorbic Acid (VITAMIN C) 1000 MG tablet Take 1,000 mg by mouth daily.    Marland Kitchen aspirin 81 MG tablet Take 81 mg by mouth daily.    . chlorthalidone (HYGROTON) 25 MG tablet Take 1 tablet (25 mg total) by mouth daily. 90 tablet 1  . cloNIDine (CATAPRES - DOSED IN MG/24 HR) 0.3 mg/24hr patch Place 1 patch (0.3 mg total) onto the skin once a week. 4 patch 12  . cloNIDine (CATAPRES) 0.1 MG tablet Take 1 tablet (0.1 mg total) by mouth 3 (three) times daily. 90 tablet 3  . GARLIC PO Take 1 capsule by mouth daily.    Marland Kitchen losartan (COZAAR) 100 MG tablet TAKE 1 TABLET BY MOUTH DAILY AT NOON 90 tablet 1  . potassium chloride SA (KLOR-CON M20) 20 MEQ tablet TAKE 1 TABLET (20 MEQ TOTAL) BY MOUTH DAILY. 90 tablet 1  . SYMBICORT 160-4.5 MCG/ACT inhaler INHALE 2 PUFFS INTO THE LUNGS EVERY MORNING AND 2 PUFFS 12 HOURS LATER 10.2 Inhaler 11  . tamsulosin (FLOMAX) 0.4 MG CAPS capsule Take 1 capsule (0.4 mg total) by mouth daily. 30 capsule 3   No facility-administered medications prior to visit.     ROS Review of Systems  Constitutional: Negative.  Negative for activity change.  HENT: Negative.   Eyes: Negative for visual disturbance.  Respiratory: Negative.  Negative for chest tightness and shortness of breath.   Cardiovascular: Negative.  Negative for chest pain, palpitations and leg swelling.  Gastrointestinal: Negative for abdominal pain, diarrhea, nausea and vomiting.  Endocrine: Negative.   Genitourinary: Negative.   Musculoskeletal: Negative.  Negative for back pain and neck pain.  Skin:  Negative.   Neurological: Negative.  Negative for dizziness, weakness and headaches.  Hematological: Negative for adenopathy. Does not bruise/bleed easily.  Psychiatric/Behavioral: Negative.  Negative for suicidal ideas.    Objective:  BP 140/90 (BP Location: Left Arm, Patient Position: Sitting, Cuff Size: Normal)   Pulse 81   Temp 97.9 F (36.6 C) (Oral)   Resp 16   Ht 5\' 8"  (1.727 m)   Wt 165 lb (74.8 kg)   SpO2 90%   BMI 25.09 kg/m   BP Readings from Last 3 Encounters:  05/31/16 140/90  03/29/16 (!) 150/100  12/15/15 118/82    Wt Readings from Last 3 Encounters:  05/31/16 165 lb (74.8 kg)  03/29/16 165 lb 8 oz (75.1 kg)  12/15/15 153 lb 8 oz (69.6 kg)    Physical Exam  Constitutional: He is oriented to person, place, and time. No distress.  HENT:  Mouth/Throat: No oropharyngeal exudate.  Eyes: Conjunctivae are normal. Right eye exhibits no discharge. Left eye exhibits no discharge. No scleral icterus.  Neck: Normal range of motion. Neck supple.  Cardiovascular: Normal rate, regular rhythm, normal heart sounds and intact distal pulses.  Exam reveals no gallop and no friction rub.   No murmur heard. Pulmonary/Chest: Effort normal and breath sounds normal. No respiratory distress. He has no wheezes. He has no rales. He exhibits no tenderness.  Abdominal: Soft. Bowel sounds are normal. He exhibits no distension and  no mass. There is no tenderness. There is no rebound and no guarding.  Musculoskeletal: Normal range of motion. He exhibits no edema, tenderness or deformity.  Neurological: He is oriented to person, place, and time.  Skin: Skin is warm and dry. No rash noted. He is not diaphoretic. No erythema. No pallor.  Vitals reviewed.   Lab Results  Component Value Date   WBC 5.5 12/15/2015   HGB 13.6 12/15/2015   HCT 39.6 12/15/2015   PLT 222.0 12/15/2015   GLUCOSE 97 03/29/2016   CHOL 171 12/15/2015   TRIG 70.0 12/15/2015   HDL 61.20 12/15/2015   LDLCALC 96  12/15/2015   ALT 15 12/15/2015   AST 28 12/15/2015   NA 139 03/29/2016   K 4.6 03/29/2016   CL 104 03/29/2016   CREATININE 1.05 03/29/2016   BUN 15 03/29/2016   CO2 30 03/29/2016   TSH 0.81 12/15/2015   PSA 2.82 12/15/2015   HGBA1C 5.1 12/15/2015    Dg Chest 2 View  Result Date: 10/07/2014 CLINICAL DATA:  71 year old male with cough, hypertension, former smoker, COPD. Radicular thoracic pain. Initial encounter. EXAM: CHEST  2 VIEW COMPARISON:  Chest radiographs 01/15/2013. FINDINGS: Small chronic retained metallic foreign bodies about the left shoulder, including along the undersurface of the left clavicle. Partially visible cervical ACDF hardware. Stable lung volumes since 2014. Normal cardiac size and mediastinal contours. Visualized tracheal air column is within normal limits. No pneumothorax, pulmonary edema, pleural effusion or confluent pulmonary opacity. Chronic bulky thoracic spine endplate spurring. No acute osseous abnormality identified. Bone mineralization appears normal. IMPRESSION: Stable radiographic appearance of the chest since 2014. No acute cardiopulmonary abnormality. Electronically Signed   By: Genevie Ann M.D.   On: 10/07/2014 16:37   Dg Thoracic Spine 2 View  Result Date: 10/07/2014 CLINICAL DATA:  71 year old male with cough, hypertension, former smoker, COPD. Radicular thoracic pain. Initial encounter. EXAM: THORACIC SPINE - 2 VIEW COMPARISON:  Chest radiographs from today and earlier. FINDINGS: Partially visible cervical ACDF hardware. Bone mineralization is within normal limits. Normal thoracic segmentation. Stable thoracic vertebral height and alignment. Bulky and flowing mid and lower thoracic endplate osteophytes again noted. Posterior ribs appear grossly intact. Visualized lung parenchyma appears symmetric. IMPRESSION: No acute osseous abnormality identified in the thoracic spine. Electronically Signed   By: Genevie Ann M.D.   On: 10/07/2014 16:41    Assessment & Plan:     [name] was seen today for hypertension.  Diagnoses and all orders for this visit:  Essential hypertension- his blood pressure is well-controlled, recent electrolytes and renal function were normal, will continue the current 3 drug regimen.   I am having Mr. Gargan maintain his aspirin, vitamin C, GARLIC PO, tamsulosin, SYMBICORT, losartan, potassium chloride SA, chlorthalidone, cloNIDine, and cloNIDine.  No orders of the defined types were placed in this encounter.    Follow-up: Return in about 6 months (around 11/28/2016).  Scarlette Calico, MD

## 2016-05-31 NOTE — Progress Notes (Signed)
Pre visit review using our clinic review tool, if applicable. No additional management support is needed unless otherwise documented below in the visit note. 

## 2016-05-31 NOTE — Patient Instructions (Signed)
Hypertension Hypertension, commonly called high blood pressure, is when the force of blood pumping through your arteries is too strong. Your arteries are the blood vessels that carry blood from your heart throughout your body. A blood pressure reading consists of a higher number over a lower number, such as 110/72. The higher number (systolic) is the pressure inside your arteries when your heart pumps. The lower number (diastolic) is the pressure inside your arteries when your heart relaxes. Ideally you want your blood pressure below 120/80. Hypertension forces your heart to work harder to pump blood. Your arteries may become narrow or stiff. Having untreated or uncontrolled hypertension can cause heart attack, stroke, kidney disease, and other problems. What increases the risk? Some risk factors for high blood pressure are controllable. Others are not. Risk factors you cannot control include:  Race. You may be at higher risk if you are African American.  Age. Risk increases with age.  Gender. Men are at higher risk than women before age 45 years. After age 65, women are at higher risk than men. Risk factors you can control include:  Not getting enough exercise or physical activity.  Being overweight.  Getting too much fat, sugar, calories, or salt in your diet.  Drinking too much alcohol. What are the signs or symptoms? Hypertension does not usually cause signs or symptoms. Extremely high blood pressure (hypertensive crisis) may cause headache, anxiety, shortness of breath, and nosebleed. How is this diagnosed? To check if you have hypertension, your health care provider will measure your blood pressure while you are seated, with your arm held at the level of your heart. It should be measured at least twice using the same arm. Certain conditions can cause a difference in blood pressure between your right and left arms. A blood pressure reading that is higher than normal on one occasion does  not mean that you need treatment. If it is not clear whether you have high blood pressure, you may be asked to return on a different day to have your blood pressure checked again. Or, you may be asked to monitor your blood pressure at home for 1 or more weeks. How is this treated? Treating high blood pressure includes making lifestyle changes and possibly taking medicine. Living a healthy lifestyle can help lower high blood pressure. You may need to change some of your habits. Lifestyle changes may include:  Following the DASH diet. This diet is high in fruits, vegetables, and whole grains. It is low in salt, red meat, and added sugars.  Keep your sodium intake below 2,300 mg per day.  Getting at least 30-45 minutes of aerobic exercise at least 4 times per week.  Losing weight if necessary.  Not smoking.  Limiting alcoholic beverages.  Learning ways to reduce stress. Your health care provider may prescribe medicine if lifestyle changes are not enough to get your blood pressure under control, and if one of the following is true:  You are 18-59 years of age and your systolic blood pressure is above 140.  You are 60 years of age or older, and your systolic blood pressure is above 150.  Your diastolic blood pressure is above 90.  You have diabetes, and your systolic blood pressure is over 140 or your diastolic blood pressure is over 90.  You have kidney disease and your blood pressure is above 140/90.  You have heart disease and your blood pressure is above 140/90. Your personal target blood pressure may vary depending on your medical   conditions, your age, and other factors. Follow these instructions at home:  Have your blood pressure rechecked as directed by your health care provider.  Take medicines only as directed by your health care provider. Follow the directions carefully. Blood pressure medicines must be taken as prescribed. The medicine does not work as well when you skip  doses. Skipping doses also puts you at risk for problems.  Do not smoke.  Monitor your blood pressure at home as directed by your health care provider. Contact a health care provider if:  You think you are having a reaction to medicines taken.  You have recurrent headaches or feel dizzy.  You have swelling in your ankles.  You have trouble with your vision. Get help right away if:  You develop a severe headache or confusion.  You have unusual weakness, numbness, or feel faint.  You have severe chest or abdominal pain.  You vomit repeatedly.  You have trouble breathing. This information is not intended to replace advice given to you by your health care provider. Make sure you discuss any questions you have with your health care provider. Document Released: 04/12/2005 Document Revised: 09/18/2015 Document Reviewed: 02/02/2013 Elsevier Interactive Patient Education  2017 [employer]  

## 2016-09-13 ENCOUNTER — Other Ambulatory Visit: Payer: Self-pay | Admitting: Internal Medicine

## 2016-09-13 DIAGNOSIS — I10 Essential (primary) hypertension: Secondary | ICD-10-CM

## 2016-09-23 ENCOUNTER — Encounter: Payer: Self-pay | Admitting: Internal Medicine

## 2016-09-23 ENCOUNTER — Other Ambulatory Visit (INDEPENDENT_AMBULATORY_CARE_PROVIDER_SITE_OTHER): Payer: Medicare Other

## 2016-09-23 ENCOUNTER — Ambulatory Visit (INDEPENDENT_AMBULATORY_CARE_PROVIDER_SITE_OTHER): Payer: Medicare Other | Admitting: Internal Medicine

## 2016-09-23 VITALS — BP 140/70 | HR 95 | Temp 97.8°F | Resp 16 | Ht 68.0 in | Wt 159.0 lb

## 2016-09-23 DIAGNOSIS — K2270 Barrett's esophagus without dysplasia: Secondary | ICD-10-CM

## 2016-09-23 DIAGNOSIS — R10816 Epigastric abdominal tenderness: Secondary | ICD-10-CM

## 2016-09-23 DIAGNOSIS — K2100 Gastro-esophageal reflux disease with esophagitis, without bleeding: Secondary | ICD-10-CM

## 2016-09-23 DIAGNOSIS — R0609 Other forms of dyspnea: Secondary | ICD-10-CM | POA: Diagnosis not present

## 2016-09-23 DIAGNOSIS — R972 Elevated prostate specific antigen [PSA]: Secondary | ICD-10-CM | POA: Diagnosis not present

## 2016-09-23 DIAGNOSIS — R413 Other amnesia: Secondary | ICD-10-CM

## 2016-09-23 DIAGNOSIS — R1312 Dysphagia, oropharyngeal phase: Secondary | ICD-10-CM | POA: Diagnosis not present

## 2016-09-23 DIAGNOSIS — N401 Enlarged prostate with lower urinary tract symptoms: Secondary | ICD-10-CM

## 2016-09-23 DIAGNOSIS — R1310 Dysphagia, unspecified: Secondary | ICD-10-CM | POA: Insufficient documentation

## 2016-09-23 DIAGNOSIS — R9431 Abnormal electrocardiogram [ECG] [EKG]: Secondary | ICD-10-CM

## 2016-09-23 LAB — URINALYSIS, ROUTINE W REFLEX MICROSCOPIC
BILIRUBIN URINE: NEGATIVE
HGB URINE DIPSTICK: NEGATIVE
Ketones, ur: NEGATIVE
NITRITE: NEGATIVE
RBC / HPF: NONE SEEN (ref 0–?)
Specific Gravity, Urine: 1.01 (ref 1.000–1.030)
TOTAL PROTEIN, URINE-UPE24: NEGATIVE
URINE GLUCOSE: NEGATIVE
Urobilinogen, UA: 0.2 (ref 0.0–1.0)
pH: 5.5 (ref 5.0–8.0)

## 2016-09-23 LAB — CBC WITH DIFFERENTIAL/PLATELET
BASOS PCT: 0.6 % (ref 0.0–3.0)
Basophils Absolute: 0 10*3/uL (ref 0.0–0.1)
Eosinophils Absolute: 0.1 10*3/uL (ref 0.0–0.7)
Eosinophils Relative: 1.5 % (ref 0.0–5.0)
HCT: 37.4 % — ABNORMAL LOW (ref 39.0–52.0)
Hemoglobin: 12.4 g/dL — ABNORMAL LOW (ref 13.0–17.0)
LYMPHS ABS: 1.5 10*3/uL (ref 0.7–4.0)
Lymphocytes Relative: 27.2 % (ref 12.0–46.0)
MCHC: 33.1 g/dL (ref 30.0–36.0)
MCV: 94.9 fl (ref 78.0–100.0)
MONO ABS: 1 10*3/uL (ref 0.1–1.0)
Monocytes Relative: 17.6 % — ABNORMAL HIGH (ref 3.0–12.0)
NEUTROS ABS: 2.9 10*3/uL (ref 1.4–7.7)
NEUTROS PCT: 53.1 % (ref 43.0–77.0)
PLATELETS: 272 10*3/uL (ref 150.0–400.0)
RBC: 3.94 Mil/uL — ABNORMAL LOW (ref 4.22–5.81)
RDW: 12.8 % (ref 11.5–15.5)
WBC: 5.5 10*3/uL (ref 4.0–10.5)

## 2016-09-23 LAB — BRAIN NATRIURETIC PEPTIDE: Pro B Natriuretic peptide (BNP): 125 pg/mL — ABNORMAL HIGH (ref 0.0–100.0)

## 2016-09-23 LAB — TROPONIN I: TNIDX: 0.01 ug/l (ref 0.00–0.06)

## 2016-09-23 MED ORDER — TAMSULOSIN HCL 0.4 MG PO CAPS: 0.4000 mg | ORAL_CAPSULE | Freq: Every day | ORAL | 3 refills | Status: DC

## 2016-09-23 MED ORDER — DEXLANSOPRAZOLE 60 MG PO CPDR: 60.0000 mg | DELAYED_RELEASE_CAPSULE | Freq: Every day | ORAL | 5 refills | Status: DC

## 2016-09-23 NOTE — Progress Notes (Signed)
Subjective:  Patient ID: Curtis Vance, male    DOB: 11/17/1943  Age: 71 y.o. MRN: [id number]  CC: Abdominal Pain   HPI Curtis Vance presents for A several month history of decreased appetite, weight loss, abdominal soreness in the epigastrium and right upper quadrant, constipation, trouble swallowing food and pills, fatigue, and shortness of breath on exertion. He occasionally takes Motrin for pain but denies any recent intake of alcohol.  Outpatient Medications Prior to Visit  Medication Sig Dispense Refill  . Ascorbic Acid (VITAMIN C) 1000 MG tablet Take 1,000 mg by mouth daily.    Marland Kitchen aspirin 81 MG tablet Take 81 mg by mouth daily.    . chlorthalidone (HYGROTON) 25 MG tablet Take 1 tablet (25 mg total) by mouth daily. 90 tablet 1  . GARLIC PO Take 1 capsule by mouth daily.    . potassium chloride SA (KLOR-CON M20) 20 MEQ tablet TAKE 1 TABLET (20 MEQ TOTAL) BY MOUTH DAILY. 90 tablet 1  . SYMBICORT 160-4.5 MCG/ACT inhaler INHALE 2 PUFFS INTO THE LUNGS EVERY MORNING AND 2 PUFFS 12 HOURS LATER 10.2 Inhaler 11  . cloNIDine (CATAPRES - DOSED IN MG/24 HR) 0.3 mg/24hr patch Place 1 patch (0.3 mg total) onto the skin once a week. 4 patch 12  . losartan (COZAAR) 100 MG tablet TAKE 1 TABLET BY MOUTH DAILY AT NOON (Patient not taking: Reported on 09/23/2016) 90 tablet 0  . cloNIDine (CATAPRES) 0.1 MG tablet Take 1 tablet (0.1 mg total) by mouth 3 (three) times daily. (Patient not taking: Reported on 09/23/2016) 90 tablet 3  . tamsulosin (FLOMAX) 0.4 MG CAPS capsule Take 1 capsule (0.4 mg total) by mouth daily. (Patient not taking: Reported on 09/23/2016) 30 capsule 3   No facility-administered medications prior to visit.     ROS Review of Systems  Constitutional: Positive for appetite change, fatigue and unexpected weight change. Negative for activity change, chills, diaphoresis and fever.  HENT: Positive for trouble swallowing. Negative for sore throat and voice change.   Eyes: Negative.     Respiratory: Positive for shortness of breath. Negative for cough, chest tightness, wheezing and stridor.   Cardiovascular: Negative for chest pain, palpitations and leg swelling.  Gastrointestinal: Positive for abdominal pain and constipation. Negative for abdominal distention, anal bleeding, blood in stool, diarrhea, nausea, rectal pain and vomiting.  Endocrine: Negative.   Genitourinary: Positive for difficulty urinating. Negative for dysuria, flank pain, frequency, hematuria, penile swelling, scrotal swelling, testicular pain and urgency.       He complains of weak urine stream and straining  Musculoskeletal: Negative.  Negative for back pain and myalgias.  Skin: Negative.   Allergic/Immunologic: Negative.   Neurological: Negative.  Negative for dizziness, tremors, weakness and light-headedness.  Hematological: Negative for adenopathy. Does not bruise/bleed easily.  Psychiatric/Behavioral: Positive for decreased concentration. Negative for confusion, self-injury, sleep disturbance and suicidal ideas. The patient is not nervous/anxious.        He and his wife complain of declining memory and forgetfulness    Objective:  BP 140/70 (BP Location: Left Arm, Patient Position: Sitting, Cuff Size: Normal)   Pulse 95   Temp 97.8 F (36.6 C) (Oral)   Resp 16   Ht 5\' 8"  (1.727 m)   Wt 159 lb (72.1 kg)   SpO2 99%   BMI 24.18 kg/m   BP Readings from Last 3 Encounters:  09/23/16 140/70  05/31/16 140/90  03/29/16 (!) 150/100    Wt Readings from Last 3 Encounters:  09/23/16 159 lb (72.1 kg)  05/31/16 165 lb (74.8 kg)  03/29/16 165 lb 8 oz (75.1 kg)    Physical Exam  Constitutional: He is oriented to person, place, and time. No distress.  HENT:  Mouth/Throat: Oropharynx is clear and moist. No oropharyngeal exudate.  Eyes: Conjunctivae are normal. Right eye exhibits no discharge. Left eye exhibits no discharge. No scleral icterus.  Neck: Normal range of motion. Neck supple. No JVD  present. No tracheal deviation present. No thyromegaly present.  Cardiovascular: Normal rate, regular rhythm, normal heart sounds and intact distal pulses.   No murmur heard. EKG ---  Sinus  Rhythm  -Anterolateral ST-elevation -repolarization variant.   PROBABLY NORMAL- similar changes were noted on an EKG from 2016 but they are little more pronounced today.  Pulmonary/Chest: Effort normal and breath sounds normal. No stridor. No respiratory distress. He has no wheezes. He has no rales. He exhibits no tenderness.  Abdominal: Soft. Bowel sounds are normal. He exhibits no distension and no mass. There is no tenderness. There is no rebound and no guarding. Hernia confirmed negative in the right inguinal area and confirmed negative in the left inguinal area.  Genitourinary: Rectum normal, testes normal and penis normal. Rectal exam shows no external hemorrhoid, no internal hemorrhoid, no fissure, no mass, no tenderness, anal tone normal and guaiac negative stool. Prostate is enlarged (1+ smooth symm BPH). Prostate is not tender. Right testis shows no mass, no swelling and no tenderness. Right testis is descended. Left testis shows no mass, no swelling and no tenderness. Left testis is descended. Uncircumcised. No phimosis, paraphimosis, hypospadias, penile erythema or penile tenderness. No discharge found.  Musculoskeletal: Normal range of motion. He exhibits no edema, tenderness or deformity.  Lymphadenopathy:    He has no cervical adenopathy.       Right: No inguinal adenopathy present.       Left: No inguinal adenopathy present.  Neurological: He is alert and oriented to person, place, and time.  Skin: Skin is warm and dry. No rash noted. He is not diaphoretic. No erythema. No pallor.  Psychiatric: He has a normal mood and affect. His behavior is normal. Judgment and thought content normal.  Vitals reviewed.   Lab Results  Component Value Date   WBC 5.5 09/23/2016   HGB 12.4 (L) 09/23/2016     HCT 37.4 (L) 09/23/2016   PLT 272.0 09/23/2016   GLUCOSE 96 09/23/2016   CHOL 171 12/15/2015   TRIG 70.0 12/15/2015   HDL 61.20 12/15/2015   LDLCALC 96 12/15/2015   ALT 46 09/23/2016   AST 37 09/23/2016   NA 133 (L) 09/23/2016   K 4.4 09/23/2016   CL 105 09/23/2016   CREATININE 1.13 09/23/2016   BUN 31 (H) 09/23/2016   CO2 28 09/23/2016   TSH 0.81 12/15/2015   PSA 6.37 (H) 09/23/2016   HGBA1C 5.1 12/15/2015    Dg Chest 2 View  Result Date: 10/07/2014 CLINICAL DATA:  71 year old male with cough, hypertension, former smoker, COPD. Radicular thoracic pain. Initial encounter. EXAM: CHEST  2 VIEW COMPARISON:  Chest radiographs 01/15/2013. FINDINGS: Small chronic retained metallic foreign bodies about the left shoulder, including along the undersurface of the left clavicle. Partially visible cervical ACDF hardware. Stable lung volumes since 2014. Normal cardiac size and mediastinal contours. Visualized tracheal air column is within normal limits. No pneumothorax, pulmonary edema, pleural effusion or confluent pulmonary opacity. Chronic bulky thoracic spine endplate spurring. No acute osseous abnormality identified. Bone mineralization appears normal. IMPRESSION:  Stable radiographic appearance of the chest since 2014. No acute cardiopulmonary abnormality. Electronically Signed   By: Genevie Ann M.D.   On: 10/07/2014 16:37   Dg Thoracic Spine 2 View  Result Date: 10/07/2014 CLINICAL DATA:  71 year old male with cough, hypertension, former smoker, COPD. Radicular thoracic pain. Initial encounter. EXAM: THORACIC SPINE - 2 VIEW COMPARISON:  Chest radiographs from today and earlier. FINDINGS: Partially visible cervical ACDF hardware. Bone mineralization is within normal limits. Normal thoracic segmentation. Stable thoracic vertebral height and alignment. Bulky and flowing mid and lower thoracic endplate osteophytes again noted. Posterior ribs appear grossly intact. Visualized lung parenchyma appears  symmetric. IMPRESSION: No acute osseous abnormality identified in the thoracic spine. Electronically Signed   By: Genevie Ann M.D.   On: 10/07/2014 16:41    Assessment & Plan:   Brenyn was seen today for abdominal pain.  Diagnoses and all orders for this visit:  Barrett's esophagus without dysplasia- he has worsening symptoms and new onset anemia so Avastin to see GI to see if he needs to undergo an upper endoscopy to screen for soft vaginal cancer -     Ambulatory referral to Gastroenterology  Oropharyngeal dysphagia- as above -     Ambulatory referral to Gastroenterology  DOE (dyspnea on exertion)- he has worsening DOE which may be related to his lung disease but he also has some changes on his EKG that were present 2 years ago but are more prominent. His labs today are negative for any evidence of ischemia and he does not experience chest pain so I don't think he needs an ischemia workup but I do recommend that he undergo an echocardiogram to see if he has developed valvular heart disease or wall motion abnormalities, diastolic dysfunction, or congestive heart failure. -     EKG 12-Lead -     Troponin I; Future -     Cardiac panel; Future -     Brain natriuretic peptide; Future -     ECHOCARDIOGRAM COMPLETE; Future  Epigastric abdominal tenderness without rebound tenderness- his labs are negative for any evidence of pathology in the liver, kidneys, or pancreas. He does have a lot of upper GI symptoms and a new onset anemia so Avastin to see GI to consider having endoscopies. -     CBC with Differential/Platelet; Future -     Comprehensive metabolic panel; Future -     Amylase; Future -     Lipase; Future -     Urinalysis, Routine w reflex microscopic; Future  Gastroesophageal reflux disease with esophagitis- I've asked him to stop taking the ibuprofen and will start a PPI. -     dexlansoprazole (DEXILANT) 60 MG capsule; Take 1 capsule (60 mg total) by mouth daily. -     CBC with  Differential/Platelet; Future  Memory loss or impairment -     Ambulatory referral to Neurology  Benign prostatic hyperplasia with lower urinary tract symptoms, symptom details unspecified- as noted below -     Cancel: PSA; Future -     tamsulosin (FLOMAX) 0.4 MG CAPS capsule; Take 1 capsule (0.4 mg total) by mouth daily. -     PSA; Future  Abnormal electrocardiogram (ECG) (EKG)- as above -     Troponin I; Future -     Cardiac panel; Future -     Brain natriuretic peptide; Future -     ECHOCARDIOGRAM COMPLETE; Future  PSA elevation- he has worsening symptoms and a significantly elevated PSA so Avastin to see  urology to see if he needs to be screened for prostate cancer. -     Ambulatory referral to Urology   I have discontinued Mr. Montelongo's cloNIDine and cloNIDine. I am also having him start on dexlansoprazole. Additionally, I am having him maintain his aspirin, vitamin C, GARLIC PO, SYMBICORT, potassium chloride SA, chlorthalidone, losartan, and tamsulosin.  Meds ordered this encounter  Medications  . dexlansoprazole (DEXILANT) 60 MG capsule    Sig: Take 1 capsule (60 mg total) by mouth daily.    Dispense:  30 capsule    Refill:  5  . tamsulosin (FLOMAX) 0.4 MG CAPS capsule    Sig: Take 1 capsule (0.4 mg total) by mouth daily.    Dispense:  90 capsule    Refill:  3     Follow-up: Return in about 3 weeks (around 10/14/2016).  Scarlette Calico, MD

## 2016-09-23 NOTE — Patient Instructions (Signed)

## 2016-09-24 ENCOUNTER — Encounter: Payer: Self-pay | Admitting: Internal Medicine

## 2016-09-24 DIAGNOSIS — R972 Elevated prostate specific antigen [PSA]: Secondary | ICD-10-CM | POA: Insufficient documentation

## 2016-09-24 LAB — COMPREHENSIVE METABOLIC PANEL
ALBUMIN: 4 g/dL (ref 3.5–5.2)
ALT: 46 U/L (ref 0–53)
AST: 37 U/L (ref 0–37)
Alkaline Phosphatase: 90 U/L (ref 39–117)
BUN: 31 mg/dL — ABNORMAL HIGH (ref 6–23)
CHLORIDE: 105 meq/L (ref 96–112)
CO2: 28 meq/L (ref 19–32)
Calcium: 9.8 mg/dL (ref 8.4–10.5)
Creatinine, Ser: 1.13 mg/dL (ref 0.40–1.50)
GFR: 81.39 mL/min (ref >60.00)
GLUCOSE: 96 mg/dL (ref 70–99)
POTASSIUM: 4.4 meq/L (ref 3.5–5.1)
SODIUM: 133 meq/L — AB (ref 135–145)
Total Bilirubin: 0.6 mg/dL (ref 0.2–1.2)
Total Protein: 6.7 g/dL (ref 6.0–8.3)

## 2016-09-24 LAB — AMYLASE: Amylase: 59 U/L (ref 27–131)

## 2016-09-24 LAB — CARDIAC PANEL
CK TOTAL: 87 U/L (ref 7–232)
CK-MB: 2.6 ng/mL (ref 0.3–4.0)
Relative Index: 3 calc — ABNORMAL HIGH (ref 0.0–2.5)

## 2016-09-24 LAB — PSA: PSA: 6.37 ng/mL — AB (ref 0.10–4.00)

## 2016-09-24 LAB — LIPASE: Lipase: 36 U/L (ref 11.0–59.0)

## 2016-09-28 ENCOUNTER — Other Ambulatory Visit: Payer: Self-pay | Admitting: Internal Medicine

## 2016-09-28 DIAGNOSIS — I10 Essential (primary) hypertension: Secondary | ICD-10-CM

## 2016-09-28 MED ORDER — CLONIDINE HCL 0.1 MG PO TABS: 0.1000 mg | ORAL_TABLET | Freq: Three times a day (TID) | ORAL | 1 refills | Status: DC

## 2016-09-29 ENCOUNTER — Encounter: Payer: Self-pay | Admitting: Gastroenterology

## 2016-09-29 ENCOUNTER — Encounter: Payer: Self-pay | Admitting: Neurology

## 2016-09-30 ENCOUNTER — Telehealth: Payer: Self-pay | Admitting: Internal Medicine

## 2016-09-30 NOTE — Telephone Encounter (Signed)
Left my name and number to call me back and schedule his echo.  First available echo is 10-13-16.

## 2016-10-04 ENCOUNTER — Other Ambulatory Visit: Payer: Self-pay | Admitting: Internal Medicine

## 2016-10-04 DIAGNOSIS — I10 Essential (primary) hypertension: Secondary | ICD-10-CM

## 2016-10-05 ENCOUNTER — Other Ambulatory Visit: Payer: Self-pay

## 2016-10-05 ENCOUNTER — Ambulatory Visit (HOSPITAL_COMMUNITY): Payer: Medicare Other | Attending: Cardiology

## 2016-10-05 DIAGNOSIS — R9431 Abnormal electrocardiogram [ECG] [EKG]: Secondary | ICD-10-CM | POA: Diagnosis not present

## 2016-10-05 DIAGNOSIS — R0609 Other forms of dyspnea: Secondary | ICD-10-CM

## 2016-10-08 ENCOUNTER — Other Ambulatory Visit: Payer: Self-pay | Admitting: Internal Medicine

## 2016-10-12 ENCOUNTER — Ambulatory Visit (INDEPENDENT_AMBULATORY_CARE_PROVIDER_SITE_OTHER): Payer: Medicare Other | Admitting: Internal Medicine

## 2016-10-12 ENCOUNTER — Encounter: Payer: Self-pay | Admitting: Internal Medicine

## 2016-10-12 ENCOUNTER — Ambulatory Visit (INDEPENDENT_AMBULATORY_CARE_PROVIDER_SITE_OTHER)
Admission: RE | Admit: 2016-10-12 | Discharge: 2016-10-12 | Disposition: A | Discharge status: Home / Self Care | Payer: Medicare Other | Source: Ambulatory Visit | Attending: Internal Medicine | Admitting: Internal Medicine

## 2016-10-12 VITALS — BP 132/76 | HR 111 | Temp 97.8°F | Resp 16 | Ht 68.0 in | Wt 151.0 lb

## 2016-10-12 DIAGNOSIS — M5440 Lumbago with sciatica, unspecified side: Secondary | ICD-10-CM

## 2016-10-12 DIAGNOSIS — M545 Low back pain: Secondary | ICD-10-CM | POA: Diagnosis not present

## 2016-10-12 MED ORDER — METHYLPREDNISOLONE ACETATE 80 MG/ML IJ SUSP
120.0000 mg | Freq: Once | INTRAMUSCULAR | Status: AC
  Administered 2016-10-12: 120 mg via INTRAMUSCULAR

## 2016-10-12 MED ORDER — HYDROCODONE-ACETAMINOPHEN 5-325 MG PO TABS: 1.0000 | ORAL_TABLET | Freq: Four times a day (QID) | ORAL | 0 refills | Status: DC | PRN

## 2016-10-12 NOTE — Patient Instructions (Signed)

## 2016-10-12 NOTE — Progress Notes (Signed)
Subjective:  Patient ID: Curtis Vance, male    DOB: 11/17/1943  Age: 71 y.o. MRN: [id number]  CC: Back Pain   HPI DAULTON HARBAUGH presents for Concerns about left lower back pain for 1 week. He denies any trauma or injury. He describes it as an achy sensation that increases with bending or movement. He says the pain does not radiate into his left lower extremity. He has chronic weakness in his left lower extremity for 20 years after prior back surgery but over the last week has had no worsening of numbness, weakness, or tingling in his lower extremities. He has not taken anything for the pain. The pain keeps him awake at night and interferes with his daily activities. He avoids NSAIDs because he has a lot of GI symptoms.  Outpatient Medications Prior to Visit  Medication Sig Dispense Refill  . Ascorbic Acid (VITAMIN C) 1000 MG tablet Take 1,000 mg by mouth daily.    Marland Kitchen aspirin 81 MG tablet Take 81 mg by mouth daily.    . chlorthalidone (HYGROTON) 25 MG tablet TAKE 1 TABLET BY MOUTH EVERY DAY 90 tablet 0  . dexlansoprazole (DEXILANT) 60 MG capsule Take 1 capsule (60 mg total) by mouth daily. 30 capsule 5  . GARLIC PO Take 1 capsule by mouth daily.    Marland Kitchen KLOR-CON M20 20 MEQ tablet TAKE 1 TABLET (20 MEQ TOTAL) BY MOUTH DAILY. 90 tablet 1  . SYMBICORT 160-4.5 MCG/ACT inhaler INHALE 2 PUFFS INTO THE LUNGS EVERY MORNING AND 2 PUFFS 12 HOURS LATER 10.2 Inhaler 11  . cloNIDine (CATAPRES) 0.1 MG tablet Take 1 tablet (0.1 mg total) by mouth 3 (three) times daily. 270 tablet 1  . potassium chloride SA (KLOR-CON M20) 20 MEQ tablet TAKE 1 TABLET (20 MEQ TOTAL) BY MOUTH DAILY. 90 tablet 1  . tamsulosin (FLOMAX) 0.4 MG CAPS capsule Take 1 capsule (0.4 mg total) by mouth daily. 90 capsule 3   No facility-administered medications prior to visit.     ROS Review of Systems  Constitutional: Negative for appetite change, diaphoresis, fatigue and unexpected weight change.  HENT: Positive for trouble  swallowing. Negative for sore throat.   Eyes: Negative for visual disturbance.  Respiratory: Negative for cough, chest tightness, shortness of breath and wheezing.   Cardiovascular: Negative for chest pain, palpitations and leg swelling.  Gastrointestinal: Negative for abdominal pain, blood in stool, constipation, diarrhea, nausea and vomiting.  Endocrine: Negative.   Genitourinary: Negative.  Negative for difficulty urinating.  Musculoskeletal: Positive for back pain. Negative for arthralgias, joint swelling and myalgias.  Skin: Negative.   Neurological: Positive for weakness. Negative for dizziness and numbness.  Hematological: Negative.  Negative for adenopathy. Does not bruise/bleed easily.  Psychiatric/Behavioral: Negative.     Objective:  BP 132/76 (BP Location: Left Arm, Patient Position: Sitting, Cuff Size: Normal)   Pulse (!) 111   Temp 97.8 F (36.6 C) (Oral)   Resp 16   Ht 5\' 8"  (1.727 m)   Wt 151 lb (68.5 kg)   SpO2 99%   BMI 22.96 kg/m   BP Readings from Last 3 Encounters:  10/12/16 132/76  09/23/16 140/70  05/31/16 140/90    Wt Readings from Last 3 Encounters:  10/12/16 151 lb (68.5 kg)  09/23/16 159 lb (72.1 kg)  05/31/16 165 lb (74.8 kg)    Physical Exam  Constitutional: He is oriented to person, place, and time. No distress.  HENT:  Mouth/Throat: Oropharynx is clear and moist. No oropharyngeal  exudate.  Eyes: Conjunctivae are normal. Right eye exhibits no discharge. Left eye exhibits no discharge. No scleral icterus.  Neck: Normal range of motion. Neck supple. No JVD present. No thyromegaly present.  Cardiovascular: Normal rate, regular rhythm and intact distal pulses.  Exam reveals no gallop.   No murmur heard. Pulmonary/Chest: Effort normal and breath sounds normal. He has no wheezes. He has no rales.  Abdominal: Soft. Bowel sounds are normal. He exhibits no distension and no mass. There is no tenderness. There is no rebound and no guarding.    Musculoskeletal: Normal range of motion. He exhibits no edema, tenderness or deformity.  Lymphadenopathy:    He has no cervical adenopathy.  Neurological: He is oriented to person, place, and time. He displays atrophy and abnormal reflex. He displays no tremor. No cranial nerve deficit or sensory deficit. He exhibits abnormal muscle tone. Coordination and gait normal.  Reflex Scores:      Tricep reflexes are 1+ on the right side and 1+ on the left side.      Bicep reflexes are 1+ on the right side and 1+ on the left side.      Brachioradialis reflexes are 1+ on the right side.      Patellar reflexes are 1+ on the right side and 0 on the left side.      Achilles reflexes are 1+ on the right side and 0 on the left side. Neg SLR in BLE  There is diffuse mild weakness in the left lower extremity  Skin: Skin is warm and dry. No rash noted. He is not diaphoretic. No erythema. No pallor.  Vitals reviewed.   Lab Results  Component Value Date   WBC 5.5 09/23/2016   HGB 12.4 (L) 09/23/2016   HCT 37.4 (L) 09/23/2016   PLT 272.0 09/23/2016   GLUCOSE 96 09/23/2016   CHOL 171 12/15/2015   TRIG 70.0 12/15/2015   HDL 61.20 12/15/2015   LDLCALC 96 12/15/2015   ALT 46 09/23/2016   AST 37 09/23/2016   NA 133 (L) 09/23/2016   K 4.4 09/23/2016   CL 105 09/23/2016   CREATININE 1.13 09/23/2016   BUN 31 (H) 09/23/2016   CO2 28 09/23/2016   TSH 0.81 12/15/2015   PSA 6.37 (H) 09/23/2016   HGBA1C 5.1 12/15/2015    Dg Chest 2 View  Result Date: 10/07/2014 CLINICAL DATA:  71 year old male with cough, hypertension, former smoker, COPD. Radicular thoracic pain. Initial encounter. EXAM: CHEST  2 VIEW COMPARISON:  Chest radiographs 01/15/2013. FINDINGS: Small chronic retained metallic foreign bodies about the left shoulder, including along the undersurface of the left clavicle. Partially visible cervical ACDF hardware. Stable lung volumes since 2014. Normal cardiac size and mediastinal contours.  Visualized tracheal air column is within normal limits. No pneumothorax, pulmonary edema, pleural effusion or confluent pulmonary opacity. Chronic bulky thoracic spine endplate spurring. No acute osseous abnormality identified. Bone mineralization appears normal. IMPRESSION: Stable radiographic appearance of the chest since 2014. No acute cardiopulmonary abnormality. Electronically Signed   By: Genevie Ann M.D.   On: 10/07/2014 16:37   Dg Thoracic Spine 2 View  Result Date: 10/07/2014 CLINICAL DATA:  71 year old male with cough, hypertension, former smoker, COPD. Radicular thoracic pain. Initial encounter. EXAM: THORACIC SPINE - 2 VIEW COMPARISON:  Chest radiographs from today and earlier. FINDINGS: Partially visible cervical ACDF hardware. Bone mineralization is within normal limits. Normal thoracic segmentation. Stable thoracic vertebral height and alignment. Bulky and flowing mid and lower thoracic endplate osteophytes again  noted. Posterior ribs appear grossly intact. Visualized lung parenchyma appears symmetric. IMPRESSION: No acute osseous abnormality identified in the thoracic spine. Electronically Signed   By: Genevie Ann M.D.   On: 10/07/2014 16:41    Assessment & Plan:   Karson was seen today for back pain.  Diagnoses and all orders for this visit:  Acute right-sided low back pain with sciatica, sciatica laterality unspecified- he has acute low back pain with no evidence of radiculopathy though he does have an old neurological deficit in his left lower extremity. Plain films were remarkable only for DDD and spurring. Will avoid NSAIDs due to his GI symptoms and history. I think his symptoms would improve with a course of steroids so we gave him an injection of Depo-Medrol and will also offer Norco as needed for pain relief. -     DG Lumbar Spine Complete; Future -     HYDROcodone-acetaminophen (NORCO/VICODIN) 5-325 MG tablet; Take 1 tablet by mouth every 6 (six) hours as needed for moderate  pain.   I have discontinued Mr. Grippi's potassium chloride SA and tamsulosin. I am also having him start on HYDROcodone-acetaminophen. Additionally, I am having him maintain his aspirin, vitamin C, GARLIC PO, SYMBICORT, dexlansoprazole, chlorthalidone, KLOR-CON M20, and cloNIDine. We administered methylPREDNISolone acetate.  Meds ordered this encounter  Medications  . cloNIDine (CATAPRES - DOSED IN MG/24 HR) 0.3 mg/24hr patch    Sig: PLACE 1 PATCH ONTO THE SKIN ONCE A WEEK    Refill:  12  . HYDROcodone-acetaminophen (NORCO/VICODIN) 5-325 MG tablet    Sig: Take 1 tablet by mouth every 6 (six) hours as needed for moderate pain.    Dispense:  35 tablet    Refill:  0  . methylPREDNISolone acetate (DEPO-MEDROL) injection 120 mg     Follow-up: Return in about 3 weeks (around 11/02/2016).  Scarlette Calico, MD

## 2016-10-20 ENCOUNTER — Telehealth: Payer: Self-pay | Admitting: Internal Medicine

## 2016-10-20 DIAGNOSIS — M810 Age-related osteoporosis without current pathological fracture: Secondary | ICD-10-CM | POA: Diagnosis not present

## 2016-10-20 DIAGNOSIS — G252 Other specified forms of tremor: Secondary | ICD-10-CM | POA: Diagnosis not present

## 2016-10-20 DIAGNOSIS — I10 Essential (primary) hypertension: Secondary | ICD-10-CM | POA: Diagnosis not present

## 2016-10-20 DIAGNOSIS — R4702 Dysphasia: Secondary | ICD-10-CM | POA: Diagnosis not present

## 2016-10-20 NOTE — Telephone Encounter (Signed)
FYI:  Spouse called in for patient.  States patient has lost 5 pounds since last OV a week ago.  State patient can not eat and that his legs are shaking and has started sweating w/o having to physically exert himself.  I informed spouse that I felt patient would need to go to ED.  I have sent to [facility] line for triage.

## 2016-10-29 DIAGNOSIS — N289 Disorder of kidney and ureter, unspecified: Secondary | ICD-10-CM | POA: Diagnosis not present

## 2016-10-29 DIAGNOSIS — R7309 Other abnormal glucose: Secondary | ICD-10-CM | POA: Diagnosis not present

## 2016-10-29 DIAGNOSIS — K7689 Other specified diseases of liver: Secondary | ICD-10-CM | POA: Diagnosis not present

## 2016-10-29 DIAGNOSIS — Z125 Encounter for screening for malignant neoplasm of prostate: Secondary | ICD-10-CM | POA: Diagnosis not present

## 2016-10-29 DIAGNOSIS — N2889 Other specified disorders of kidney and ureter: Secondary | ICD-10-CM | POA: Diagnosis not present

## 2016-10-29 DIAGNOSIS — K7589 Other specified inflammatory liver diseases: Secondary | ICD-10-CM | POA: Diagnosis not present

## 2016-11-03 ENCOUNTER — Encounter: Payer: Self-pay | Admitting: Gastroenterology

## 2016-11-03 ENCOUNTER — Other Ambulatory Visit (INDEPENDENT_AMBULATORY_CARE_PROVIDER_SITE_OTHER): Payer: Medicare Other

## 2016-11-03 ENCOUNTER — Ambulatory Visit (INDEPENDENT_AMBULATORY_CARE_PROVIDER_SITE_OTHER): Payer: Medicare Other | Admitting: Gastroenterology

## 2016-11-03 VITALS — BP 120/60 | HR 92 | Ht 68.0 in | Wt 156.0 lb

## 2016-11-03 DIAGNOSIS — K2270 Barrett's esophagus without dysplasia: Secondary | ICD-10-CM

## 2016-11-03 DIAGNOSIS — R945 Abnormal results of liver function studies: Secondary | ICD-10-CM | POA: Diagnosis not present

## 2016-11-03 DIAGNOSIS — R1310 Dysphagia, unspecified: Secondary | ICD-10-CM

## 2016-11-03 DIAGNOSIS — D649 Anemia, unspecified: Secondary | ICD-10-CM

## 2016-11-03 DIAGNOSIS — R7989 Other specified abnormal findings of blood chemistry: Secondary | ICD-10-CM

## 2016-11-03 DIAGNOSIS — R634 Abnormal weight loss: Secondary | ICD-10-CM

## 2016-11-03 LAB — COMPREHENSIVE METABOLIC PANEL
ALT: 68 U/L — AB (ref 0–53)
AST: 28 U/L (ref 0–37)
Albumin: 3.5 g/dL (ref 3.5–5.2)
Alkaline Phosphatase: 102 U/L (ref 39–117)
BILIRUBIN TOTAL: 0.5 mg/dL (ref 0.2–1.2)
BUN: 24 mg/dL — ABNORMAL HIGH (ref 6–23)
CALCIUM: 9.5 mg/dL (ref 8.4–10.5)
CO2: 28 mEq/L (ref 19–32)
CREATININE: 0.8 mg/dL (ref 0.40–1.50)
Chloride: 101 mEq/L (ref 96–112)
GFR: 121.21 mL/min (ref >60.00)
GLUCOSE: 101 mg/dL — AB (ref 70–99)
Potassium: 4.7 mEq/L (ref 3.5–5.1)
Sodium: 136 mEq/L (ref 135–145)
TOTAL PROTEIN: 6.4 g/dL (ref 6.0–8.3)

## 2016-11-03 LAB — IRON AND TIBC
%SAT: 18 % (ref 15–60)
IRON: 34 ug/dL — AB (ref 50–180)
TIBC: 191 ug/dL — AB (ref 250–425)
UIBC: 157 ug/dL

## 2016-11-03 LAB — CBC WITH DIFFERENTIAL/PLATELET
BASOS ABS: 0 10*3/uL (ref 0.0–0.1)
Basophils Relative: 0.2 % (ref 0.0–3.0)
EOS PCT: 1.4 % (ref 0.0–5.0)
Eosinophils Absolute: 0.2 10*3/uL (ref 0.0–0.7)
HCT: 32.3 % — ABNORMAL LOW (ref 39.0–52.0)
HEMOGLOBIN: 10.6 g/dL — AB (ref 13.0–17.0)
Lymphocytes Relative: 13.6 % (ref 12.0–46.0)
Lymphs Abs: 2.3 10*3/uL (ref 0.7–4.0)
MCHC: 32.9 g/dL (ref 30.0–36.0)
MCV: 91 fl (ref 78.0–100.0)
MONOS PCT: 11.6 % (ref 3.0–12.0)
Monocytes Absolute: 1.9 10*3/uL — ABNORMAL HIGH (ref 0.1–1.0)
Neutro Abs: 12.1 10*3/uL — ABNORMAL HIGH (ref 1.4–7.7)
Neutrophils Relative %: 73.2 % (ref 43.0–77.0)
Platelets: 361 10*3/uL (ref 150.0–400.0)
RBC: 3.55 Mil/uL — ABNORMAL LOW (ref 4.22–5.81)
RDW: 13.9 % (ref 11.5–15.5)
WBC: 16.6 10*3/uL — AB (ref 4.0–10.5)

## 2016-11-03 LAB — VITAMIN B12

## 2016-11-03 LAB — FERRITIN: FERRITIN: 442.5 ng/mL — AB (ref 22.0–322.0)

## 2016-11-03 LAB — TSH: TSH: <0.01 u[IU]/mL — ABNORMAL LOW (ref 0.35–4.50)

## 2016-11-03 LAB — FOLATE

## 2016-11-03 NOTE — Addendum Note (Signed)
Addended by: Yetta Flock on: 11/03/2016 11:10 PM   Modules accepted: Level of Service

## 2016-11-03 NOTE — Patient Instructions (Signed)
If you are age 73 or older, your body mass index should be between 23-30. Your Body mass index is 23.72 kg/m. If this is out of the aforementioned range listed, please consider follow up with your Primary Care Provider.  If you are age 57 or younger, your body mass index should be between 19-25. Your Body mass index is 23.72 kg/m. If this is out of the aformentioned range listed, please consider follow up with your Primary Care Provider.   Your physician has requested that you go to the basement for lab work before leaving today.  You have been scheduled for an endoscopy. Please follow written instructions given to you at your visit today. If you use inhalers (even only as needed), please bring them with you on the day of your procedure. Your physician has requested that you go to www.[email or web address].[email or web address] and enter the access code given to you at your visit today. This web site gives a general overview about your procedure. However, you should still follow specific instructions given to you by our office regarding your preparation for the procedure.  Thank you.
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
## 2016-11-04 ENCOUNTER — Other Ambulatory Visit: Payer: Self-pay

## 2016-11-04 DIAGNOSIS — R945 Abnormal results of liver function studies: Secondary | ICD-10-CM

## 2016-11-04 DIAGNOSIS — R1310 Dysphagia, unspecified: Secondary | ICD-10-CM

## 2016-11-04 DIAGNOSIS — R7989 Other specified abnormal findings of blood chemistry: Secondary | ICD-10-CM

## 2016-11-04 DIAGNOSIS — R634 Abnormal weight loss: Secondary | ICD-10-CM

## 2016-11-04 DIAGNOSIS — D649 Anemia, unspecified: Secondary | ICD-10-CM

## 2016-11-04 LAB — HEPATITIS PANEL, ACUTE
HCV Ab: NEGATIVE
Hep A IgM: NONREACTIVE
Hep B C IgM: NONREACTIVE
Hepatitis B Surface Ag: NEGATIVE

## 2016-11-05 ENCOUNTER — Encounter: Payer: Self-pay | Admitting: Gastroenterology

## 2016-11-05 ENCOUNTER — Ambulatory Visit (AMBULATORY_SURGERY_CENTER): Payer: Medicare Other | Admitting: Gastroenterology

## 2016-11-05 ENCOUNTER — Other Ambulatory Visit: Payer: Medicare Other

## 2016-11-05 VITALS — BP 129/86 | HR 89 | Temp 98.9°F | Resp 17 | Ht 68.0 in | Wt 156.0 lb

## 2016-11-05 DIAGNOSIS — R945 Abnormal results of liver function studies: Secondary | ICD-10-CM | POA: Diagnosis not present

## 2016-11-05 DIAGNOSIS — D649 Anemia, unspecified: Secondary | ICD-10-CM

## 2016-11-05 DIAGNOSIS — R1310 Dysphagia, unspecified: Secondary | ICD-10-CM

## 2016-11-05 DIAGNOSIS — K219 Gastro-esophageal reflux disease without esophagitis: Secondary | ICD-10-CM | POA: Diagnosis not present

## 2016-11-05 DIAGNOSIS — R634 Abnormal weight loss: Secondary | ICD-10-CM

## 2016-11-05 DIAGNOSIS — R7989 Other specified abnormal findings of blood chemistry: Secondary | ICD-10-CM

## 2016-11-05 MED ORDER — SODIUM CHLORIDE 0.9 % IV SOLN: 500.0000 mL | INTRAVENOUS | Status: DC

## 2016-11-05 NOTE — Progress Notes (Signed)
Dental advisory given to patientAlert and oriented x3, pleased with MAC, report to RN Sarah 

## 2016-11-05 NOTE — Op Note (Signed)
[facility] Patient Name: Curtis Vance Procedure Date: 11/05/2016 9:01 AM MRN: [id number] Endoscopist: Remo Lipps P. Armbruster MD, MD Age: 71 Referring MD:  Date of Birth: 11/17/1943 Gender: Male Account #: [id number] Procedure:                Upper GI endoscopy Indications:              Dysphagia, Follow-up of Barrett's esophagus                            (irregular z-line), weight loss Medicines:                Monitored Anesthesia Care Procedure:                Pre-Anesthesia Assessment:                           - Prior to the procedure, a History and Physical                            was performed, and patient medications and                            allergies were reviewed. The patient's tolerance of                            previous anesthesia was also reviewed. The risks                            and benefits of the procedure and the sedation                            options and risks were discussed with the patient.                            All questions were answered, and informed consent                            was obtained. Prior Anticoagulants: The patient has                            taken no previous anticoagulant or antiplatelet                            agents. ASA Grade Assessment: II - A patient with                            mild systemic disease. After reviewing the risks                            and benefits, the patient was deemed in                            satisfactory condition to undergo the procedure.  After obtaining informed consent, the endoscope was                            passed under direct vision. Throughout the                            procedure, the patient's blood pressure, pulse, and                            oxygen saturations were monitored continuously. The                            Model GIF-HQ190 [id number]) scope was introduced                            through the mouth, and  advanced to the second part                            of duodenum. The upper GI endoscopy was                            accomplished without difficulty. The patient                            tolerated the procedure well. Scope In: Scope Out: Findings:                 Esophagogastric landmarks were identified: the                            Z-line was found at 39 cm, the gastroesophageal                            junction was found at 39 cm and the upper extent of                            the gastric folds was found at 41 cm from the                            incisors.                           A 2 cm hiatal hernia was present.                           One mild benign-appearing, intrinsic stenosis was                            found 39 cm from the incisors. This measured less                            than one cm (in length). A TTS dilator was passed  through the scope. Dilation with an 18-19-20 mm                            balloon dilator was performed to 20 mm, no mucosal                            wrents were noted after this intervention. The                            balloon was dragged proximally up the esophagus and                            no obvious stenosis encountered.                           The exam of the esophagus was otherwise normal. No                            evidence of Barrett's esophagus                           The entire examined stomach was normal.                           The duodenal bulb and second portion of the                            duodenum were normal. Complications:            No immediate complications. Estimated blood loss:                            Minimal. Estimated Blood Loss:     Estimated blood loss was minimal. Impression:               - Esophagogastric landmarks identified.                           - 2 cm hiatal hernia.                           - Benign-appearing esophageal stenosis. Dilated  to                            29mm as above but no obvious mucosal wrent                            following dilation.                           - Normal stomach.                           - Normal duodenal bulb and second portion of the  duodenum. Recommendation:           - Patient has a contact number available for                            emergencies. The signs and symptoms of potential                            delayed complications were discussed with the                            patient. Return to normal activities tomorrow.                            Written discharge instructions were provided to the                            patient.                           - Resume previous diet.                           - Continue present medications.                           - Await course following dilation                           - Await pending thyroid studies (TSH low, patient                            could have hyperthyroidism causing weight loss) Remo Lipps P. Armbruster MD, MD 11/05/2016 9:26:51 AM This report has been signed electronically.

## 2016-11-05 NOTE — Patient Instructions (Signed)
YOU HAD AN ENDOSCOPIC PROCEDURE TODAY AT THE [facility]:   Refer to the procedure report that was given to you for any specific questions about what was found during the examination.  If the procedure report does not answer your questions, please call your gastroenterologist to clarify.  If you requested that your care partner not be given the details of your procedure findings, then the procedure report has been included in a sealed envelope for you to review at your convenience later.  YOU SHOULD EXPECT: Some feelings of bloating in the abdomen. Passage of more gas than usual.  Walking can help get rid of the air that was put into your GI tract during the procedure and reduce the bloating. If you had a lower endoscopy (such as a colonoscopy or flexible sigmoidoscopy) you may notice spotting of blood in your stool or on the toilet paper. If you underwent a bowel prep for your procedure, you may not have a normal bowel movement for a few days.  Please Note:  You might notice some irritation and congestion in your nose or some drainage.  This is from the oxygen used during your procedure.  There is no need for concern and it should clear up in a day or so.  SYMPTOMS TO REPORT IMMEDIATELY:   Following upper endoscopy (EGD)  Vomiting of blood or coffee ground material  New chest pain or pain under the shoulder blades  Painful or persistently difficult swallowing  New shortness of breath  Fever of 100F or higher  Black, tarry-looking stools  For urgent or emergent issues, a gastroenterologist can be reached at any hour by calling [phone].   DIET:  We do recommend a small meal at first, but then you may proceed to your regular diet.  Drink plenty of fluids but you should avoid alcoholic beverages for 24 hours.  MEDICATIONS: Continue present medications.   Please see handouts given to you by your recovery nurse.  ACTIVITY:  You should plan to take it easy for the rest of  today and you should NOT DRIVE or use heavy machinery until tomorrow (because of the sedation medicines used during the test).    FOLLOW UP: Our staff will call the number listed on your records the next business day following your procedure to check on you and address any questions or concerns that you may have regarding the information given to you following your procedure. If we do not reach you, we will leave a message.  However, if you are feeling well and you are not experiencing any problems, there is no need to return our call.  We will assume that you have returned to your regular daily activities without incident.  If any biopsies were taken you will be contacted by phone or by letter within the next 1-3 weeks.  Please call us at [phone] if you have not heard about the biopsies in 3 weeks.   Thank you for allowing us to provide for your healthcare needs today.  SIGNATURES/CONFIDENTIALITY: You and/or your care partner have signed paperwork which will be entered into your electronic medical record.  These signatures attest to the fact that that the information above on your After Visit Summary has been reviewed and is understood.  Full responsibility of the confidentiality of this discharge information lies with you and/or your care-partner. 

## 2016-11-05 NOTE — Progress Notes (Signed)
Called to room to assist during endoscopic procedure.  Patient ID and intended procedure confirmed with present staff. Received instructions for my participation in the procedure from the performing physician.  

## 2016-11-08 ENCOUNTER — Telehealth: Payer: Self-pay | Admitting: *Deleted

## 2016-11-08 NOTE — Telephone Encounter (Signed)
  Follow up Call-  Call back number 11/05/2016  Post procedure Call Back phone  # wife- [phone]  Permission to leave phone message Yes  Some recent data might be hidden     Patient questions:  Do you have a fever, pain , or abdominal swelling? No. Pain Score  0 *  Have you tolerated food without any problems? Yes.    Have you been able to return to your normal activities? Yes.    Do you have any questions about your discharge instructions: Diet   No. Medications  No. Follow up visit  No.  Do you have questions or concerns about your Care? No.  Actions: * If pain score is 4 or above: No action needed, pain <4.

## 2016-11-11 ENCOUNTER — Other Ambulatory Visit: Payer: Self-pay | Admitting: Internal Medicine

## 2016-11-11 ENCOUNTER — Other Ambulatory Visit: Payer: Self-pay

## 2016-11-11 DIAGNOSIS — E0590 Thyrotoxicosis, unspecified without thyrotoxic crisis or storm: Secondary | ICD-10-CM

## 2016-11-11 LAB — T4F+T3FREE
Free T-3: 10 pg/mL — ABNORMAL HIGH
Free Thyroxine: 4.89 ng/dL — ABNORMAL HIGH
Thyroxine (T4): 15.3 ug/dL — ABNORMAL HIGH
Triiodothyronine (T-3), Serum: 279 ng/dL — ABNORMAL HIGH

## 2016-11-18 ENCOUNTER — Encounter: Payer: Self-pay | Admitting: Internal Medicine

## 2016-11-18 ENCOUNTER — Ambulatory Visit (INDEPENDENT_AMBULATORY_CARE_PROVIDER_SITE_OTHER): Payer: Medicare Other | Admitting: Internal Medicine

## 2016-11-18 ENCOUNTER — Other Ambulatory Visit: Payer: Medicare Other

## 2016-11-18 VITALS — BP 140/80 | HR 90 | Temp 98.3°F | Resp 16 | Ht 68.0 in | Wt 158.0 lb

## 2016-11-18 DIAGNOSIS — E0590 Thyrotoxicosis, unspecified without thyrotoxic crisis or storm: Secondary | ICD-10-CM

## 2016-11-18 DIAGNOSIS — G8929 Other chronic pain: Secondary | ICD-10-CM | POA: Insufficient documentation

## 2016-11-18 DIAGNOSIS — M25511 Pain in right shoulder: Secondary | ICD-10-CM | POA: Diagnosis not present

## 2016-11-18 MED ORDER — PROPYLTHIOURACIL 50 MG PO TABS: 50.0000 mg | ORAL_TABLET | Freq: Two times a day (BID) | ORAL | 3 refills | Status: DC

## 2016-11-18 NOTE — Progress Notes (Signed)
Subjective:  Patient ID: Curtis Vance, male    DOB: 11/17/1943  Age: 71 y.o. MRN: [id number]  CC: Thyroid Problem   HPI HAWKE VILLALPANDO presents for follow-up- he was seen a few weeks ago for weight loss and trouble swallowing and saw GI and had an upper endoscopy done which showed an esophageal stenosis. The gastroenterologist also did TFTs and found that his T3 was elevated to 79, free T3 was elevated at 10, T4 elevated at 15.3 and free T4 elevated at 4.89. His TSH is undetectable. He has gained 10 pounds since I last saw him but he continues to complain of insomnia, jitteriness, and shakiness. He was recently seen in urgent care center and tells me he was given a course of steroids for an unknown reason.  Outpatient Medications Prior to Visit  Medication Sig Dispense Refill  . Ascorbic Acid (VITAMIN C) 1000 MG tablet Take 1,000 mg by mouth daily.    Marland Kitchen aspirin 81 MG tablet Take 81 mg by mouth daily.    . ciprofloxacin (CIPRO) 500 MG tablet 2 (two) times daily. Taking for 14 days    . cloNIDine (CATAPRES - DOSED IN MG/24 HR) 0.3 mg/24hr patch PLACE 1 PATCH ONTO THE SKIN ONCE A WEEK  12  . dexlansoprazole (DEXILANT) 60 MG capsule Take 1 capsule (60 mg total) by mouth daily. 30 capsule 5  . GARLIC PO Take 1 capsule by mouth daily.    Marland Kitchen KLOR-CON M20 20 MEQ tablet TAKE 1 TABLET (20 MEQ TOTAL) BY MOUTH DAILY. 90 tablet 1  . Magnesium 100 MG CAPS Take by mouth daily.    . SYMBICORT 160-4.5 MCG/ACT inhaler INHALE 2 PUFFS INTO THE LUNGS EVERY MORNING AND 2 PUFFS 12 HOURS LATER 10.2 Inhaler 11   Facility-Administered Medications Prior to Visit  Medication Dose Route Frequency Provider Last Rate Last Dose  . 0.9 %  sodium chloride infusion  500 mL Intravenous Continuous Armbruster, Renelda Loma, MD        ROS Review of Systems  Constitutional: Positive for fatigue and unexpected weight change. Negative for chills and diaphoresis.  HENT: Negative.  Negative for trouble swallowing and voice  change.   Eyes: Negative for visual disturbance.  Respiratory: Negative for cough, chest tightness, shortness of breath and wheezing.   Cardiovascular: Negative for chest pain, palpitations and leg swelling.  Gastrointestinal: Negative for abdominal pain, constipation, diarrhea, nausea and vomiting.  Endocrine: Positive for cold intolerance. Negative for heat intolerance.  Genitourinary: Negative.  Negative for decreased urine volume, difficulty urinating and urgency.  Musculoskeletal: Positive for arthralgias. Negative for myalgias and neck pain.       He complains of chronic right shoulder pain  Allergic/Immunologic: Negative.   Neurological: Positive for tremors and weakness. Negative for dizziness and light-headedness.  Psychiatric/Behavioral: Positive for sleep disturbance. Negative for behavioral problems, confusion, decreased concentration, dysphoric mood, hallucinations, self-injury and suicidal ideas. The patient is nervous/anxious.     Objective:  BP 140/80 (BP Location: Left Arm, Patient Position: Sitting, Cuff Size: Normal)   Pulse 90   Temp 98.3 F (36.8 C) (Oral)   Resp 16   Ht 5\' 8"  (1.727 m)   Wt 158 lb (71.7 kg)   SpO2 99%   BMI 24.02 kg/m   BP Readings from Last 3 Encounters:  11/18/16 140/80  11/05/16 129/86  11/03/16 120/60    Wt Readings from Last 3 Encounters:  11/18/16 158 lb (71.7 kg)  11/05/16 156 lb (70.8 kg)  11/03/16 156  lb (70.8 kg)    Physical Exam  Constitutional: He is oriented to person, place, and time. No distress.  HENT:  Mouth/Throat: Oropharynx is clear and moist. No oropharyngeal exudate.  Eyes: Conjunctivae are normal. Right eye exhibits no discharge. Left eye exhibits no discharge. No scleral icterus.  Neck: Normal range of motion. Neck supple. No JVD present. No thyroid mass and no thyromegaly present.  Cardiovascular: Normal rate, regular rhythm and intact distal pulses.  Exam reveals no gallop and no friction rub.   No murmur  heard. Pulmonary/Chest: Effort normal and breath sounds normal. No stridor. No respiratory distress. He has no wheezes. He has no rales. He exhibits no tenderness.  Abdominal: Soft. Bowel sounds are normal. He exhibits no distension and no mass. There is no tenderness. There is no rebound and no guarding.  Musculoskeletal: He exhibits no edema or deformity.       Right shoulder: He exhibits decreased range of motion and tenderness. He exhibits no bony tenderness, no swelling, no effusion, no crepitus, no pain and no spasm.  Lymphadenopathy:    He has no cervical adenopathy.  Neurological: He is alert and oriented to person, place, and time.  Skin: Skin is warm and dry. No rash noted. He is not diaphoretic. No erythema. No pallor.  Vitals reviewed.   Lab Results  Component Value Date   WBC 16.6 (H) 11/03/2016   HGB 10.6 (L) 11/03/2016   HCT 32.3 (L) 11/03/2016   PLT 361.0 11/03/2016   GLUCOSE 101 (H) 11/03/2016   CHOL 171 12/15/2015   TRIG 70.0 12/15/2015   HDL 61.20 12/15/2015   LDLCALC 96 12/15/2015   ALT 68 (H) 11/03/2016   AST 28 11/03/2016   NA 136 11/03/2016   K 4.7 11/03/2016   CL 101 11/03/2016   CREATININE 0.80 11/03/2016   BUN 24 (H) 11/03/2016   CO2 28 11/03/2016   TSH <0.01 (L) 11/03/2016   PSA 6.37 (H) 09/23/2016   HGBA1C 5.1 12/15/2015   15.3      Comment: Reference Range:  Adults: 4.2 - 13.0   Free T-3 pg/mL 10    Comment: Reference Range:  >=20y: 2.0 - 4.4   Triiodothyronine (T-3), Serum ng/dL 279    Comment: Reference Range:  Adults: 55 - 170   Free Thyroxine ng/dL 4.89    Comment: Reference Range:        Dg Lumbar Spine Complete  Result Date: 10/12/2016 CLINICAL DATA:  RIGHT-side lower back pain for 2 weeks with sciatica (unspecified laterality) EXAM: LUMBAR SPINE - COMPLETE 4+ VIEW COMPARISON:  MRI lumbar spine 02/2008 FINDINGS: Five non-rib-bearing lumbar vertebra. Multilevel disc space narrowing and endplate spur formation. Vertebral body  heights maintained without fracture or bone destruction. Minimal retrolisthesis at L4-L5. No spondylolysis. SI joints preserved. IMPRESSION: Multilevel degenerative disc disease changes thoracolumbar spine. No acute abnormalities. Electronically Signed   By: Lavonia Dana M.D.   On: 10/12/2016 16:03    Assessment & Plan:   Joesiah was seen today for thyroid problem.  Diagnoses and all orders for this visit:  Hyperthyroidism- he has mild thyroid toxicosis so will start PTU to decrease the conversion of T4 to the more active T-3. Will get a radioactive scan to see if there is concern for toxic multinodular gorter, toxic solitary nodule or Graves' disease. Graves' disease is unlikely since he doesn't have any exophthalmos and his thyroid peroxidase antibody is negative. He will see endocrinology in about a month. Will check a thyroid binding globulin  to see if there is concern for exogenous intake of levothyroxine. -     Thyroxine binding globulin; Future -     Thyroid peroxidase antibody; Future -     NM Thyroid Scan/Uptake 24 Hr; Future -     propylthiouracil (PTU) 50 MG tablet; Take 1 tablet (50 mg total) by mouth 2 (two) times daily.  Chronic right shoulder pain -     Ambulatory referral to Sports Medicine   I am having Mr. Kimmel start on propylthiouracil. I am also having him maintain his aspirin, vitamin C, GARLIC PO, SYMBICORT, dexlansoprazole, KLOR-CON M20, cloNIDine, ciprofloxacin, and Magnesium. We will continue to administer sodium chloride.  Meds ordered this encounter  Medications  . propylthiouracil (PTU) 50 MG tablet    Sig: Take 1 tablet (50 mg total) by mouth 2 (two) times daily.    Dispense:  60 tablet    Refill:  3     Follow-up: Return in about 6 weeks (around 12/30/2016).  Scarlette Calico, MD

## 2016-11-18 NOTE — Patient Instructions (Signed)
Hyperthyroidism Hyperthyroidism is when the thyroid is too active (overactive). Your thyroid is a large gland that is located in your neck. The thyroid helps to control how your body uses food (metabolism). When your thyroid is overactive, it produces too much of a hormone called thyroxine. What are the causes? Causes of hyperthyroidism may include:  Graves disease. This is when your immune system attacks the thyroid gland. This is the most common cause.  Inflammation of the thyroid gland.  Tumor in the thyroid gland or somewhere else.  Excessive use of thyroid medicines, including: ? Prescription thyroid supplement. ? Herbal supplements that mimic thyroid hormones.  Solid or fluid-filled lumps within your thyroid gland (thyroid nodules).  Excessive ingestion of iodine.  What increases the risk?  Being male.  Having a family history of thyroid conditions. What are the signs or symptoms? Signs and symptoms of hyperthyroidism may include:  Nervousness.  Inability to tolerate heat.  Unexplained weight loss.  Diarrhea.  Change in the texture of hair or skin.  Heart skipping beats or making extra beats.  Rapid heart rate.  Loss of menstruation.  Shaky hands.  Fatigue.  Restlessness.  Increased appetite.  Sleep problems.  Enlarged thyroid gland or nodules.  How is this diagnosed? Diagnosis of hyperthyroidism may include:  Medical history and physical exam.  Blood tests.  Ultrasound tests.  How is this treated? Treatment may include:  Medicines to control your thyroid.  Surgery to remove your thyroid.  Radiation therapy.  Follow these instructions at home:  Take medicines only as directed by your health care provider.  Do not use any tobacco products, including cigarettes, chewing tobacco, or electronic cigarettes. If you need help quitting, ask your health care provider.  Do not exercise or do physical activity until your health care provider  approves.  Keep all follow-up appointments as directed by your health care provider. This is important. Contact a health care provider if:  Your symptoms do not get better with treatment.  You have fever.  You are taking thyroid replacement medicine and you: ? Have depression. ? Feel mentally and physically slow. ? Have weight gain. Get help right away if:  You have decreased alertness or a change in your awareness.  You have abdominal pain.  You feel dizzy.  You have a rapid heartbeat.  You have an irregular heartbeat. This information is not intended to replace advice given to you by your health care provider. Make sure you discuss any questions you have with your health care provider. Document Released: 04/12/2005 Document Revised: 09/11/2015 Document Reviewed: 08/28/2013 Elsevier Interactive Patient Education  2017 Elsevier Inc.  

## 2016-11-19 LAB — THYROID PEROXIDASE ANTIBODY: Thyroperoxidase Ab SerPl-aCnc: 2 IU/mL (ref <9)

## 2016-11-23 ENCOUNTER — Ambulatory Visit (INDEPENDENT_AMBULATORY_CARE_PROVIDER_SITE_OTHER): Payer: Medicare Other | Admitting: Neurology

## 2016-11-23 ENCOUNTER — Encounter: Payer: Self-pay | Admitting: Neurology

## 2016-11-23 VITALS — BP 162/80 | HR 125 | Ht 68.0 in | Wt 155.0 lb

## 2016-11-23 DIAGNOSIS — G3184 Mild cognitive impairment, so stated: Secondary | ICD-10-CM
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
## 2016-11-23 NOTE — Patient Instructions (Signed)
1. Bloodwork for B12 level 2. Schedule MRI brain without contrast 3. Continue with treatment of thyroid 4. Control of blood pressure, cholesterol, as well as physical exercise and brain stimulation exercises are important for brain health 5. Follow-up in 6 months, call for any changes

## 2016-11-26 DIAGNOSIS — G3184 Mild cognitive impairment, so stated: Secondary | ICD-10-CM | POA: Insufficient documentation

## 2016-11-26 LAB — THYROXINE BINDING GLOBULIN: TBG: 14.7 ug/mL (ref 12.7–25.1)

## 2016-11-29 ENCOUNTER — Telehealth: Payer: Self-pay | Admitting: Internal Medicine

## 2016-11-29 ENCOUNTER — Encounter (HOSPITAL_COMMUNITY): Payer: Self-pay

## 2016-11-29 ENCOUNTER — Ambulatory Visit: Payer: Medicare Other | Admitting: Internal Medicine

## 2016-11-29 ENCOUNTER — Other Ambulatory Visit: Payer: Medicare Other

## 2016-11-29 ENCOUNTER — Encounter (HOSPITAL_COMMUNITY)
Admission: RE | Admit: 2016-11-29 | Discharge: 2016-11-29 | Disposition: A | Discharge status: Home / Self Care | Payer: Medicare Other | Source: Ambulatory Visit | Attending: Internal Medicine | Admitting: Internal Medicine

## 2016-11-29 DIAGNOSIS — E0590 Thyrotoxicosis, unspecified without thyrotoxic crisis or storm: Secondary | ICD-10-CM | POA: Insufficient documentation

## 2016-11-29 DIAGNOSIS — G3184 Mild cognitive impairment, so stated: Secondary | ICD-10-CM

## 2016-11-29 NOTE — Telephone Encounter (Signed)
FYI: States patient was sent over for tyroid uptake in scan.  States patient needs to stop PTU 5 day prior to this.  States patient is going to reschedule.

## 2016-11-30 ENCOUNTER — Encounter (HOSPITAL_COMMUNITY): Payer: Medicare Other

## 2016-11-30 LAB — VITAMIN B12

## 2016-12-06 ENCOUNTER — Telehealth: Payer: Self-pay

## 2016-12-06 NOTE — Telephone Encounter (Signed)
-----   Message from Cameron Sprang, MD sent at 11/30/2016 11:05 AM EDT ----- Pls let patient know B12 level is good. Thanks

## 2016-12-06 NOTE — Telephone Encounter (Signed)
Spoke w/Pts wife, advsd her of normal B12 level. She will let the Pt know.

## 2016-12-07 DIAGNOSIS — N401 Enlarged prostate with lower urinary tract symptoms: Secondary | ICD-10-CM | POA: Diagnosis not present

## 2016-12-07 DIAGNOSIS — R3912 Poor urinary stream: Secondary | ICD-10-CM | POA: Diagnosis not present

## 2016-12-07 DIAGNOSIS — R972 Elevated prostate specific antigen [PSA]: Secondary | ICD-10-CM | POA: Diagnosis not present

## 2016-12-08 ENCOUNTER — Ambulatory Visit
Admission: RE | Admit: 2016-12-08 | Discharge: 2016-12-08 | Disposition: A | Discharge status: Home / Self Care | Payer: Medicare Other | Source: Ambulatory Visit | Attending: Neurology | Admitting: Neurology

## 2016-12-08 DIAGNOSIS — R413 Other amnesia: Secondary | ICD-10-CM | POA: Diagnosis not present

## 2016-12-08 DIAGNOSIS — G3184 Mild cognitive impairment, so stated: Secondary | ICD-10-CM

## 2016-12-13 ENCOUNTER — Ambulatory Visit (HOSPITAL_COMMUNITY)
Admission: RE | Admit: 2016-12-13 | Discharge: 2016-12-13 | Disposition: A | Discharge status: Home / Self Care | Payer: Medicare Other | Source: Ambulatory Visit | Attending: Internal Medicine | Admitting: Internal Medicine

## 2016-12-13 ENCOUNTER — Telehealth: Payer: Self-pay

## 2016-12-13 DIAGNOSIS — E0590 Thyrotoxicosis, unspecified without thyrotoxic crisis or storm: Secondary | ICD-10-CM | POA: Insufficient documentation

## 2016-12-13 MED ORDER — SODIUM IODIDE I 131 CAPSULE
6.9000 | Freq: Once | INTRAVENOUS | Status: AC | PRN
  Administered 2016-12-13: 6.9 via ORAL

## 2016-12-13 NOTE — Telephone Encounter (Signed)
-----   Message from Cameron Sprang, MD sent at 12/13/2016  9:04 AM EDT ----- Pls let him know MRI brain did not show any evidence of tumor, stroke, or bleed. It showed age-related changes. Thanks

## 2016-12-13 NOTE — Telephone Encounter (Signed)
Spoke with pt's wife, Holley Raring, relaying message below.

## 2016-12-14 ENCOUNTER — Encounter: Payer: Self-pay | Admitting: Internal Medicine

## 2016-12-14 ENCOUNTER — Encounter (HOSPITAL_COMMUNITY)
Admission: RE | Admit: 2016-12-14 | Discharge: 2016-12-14 | Disposition: A | Discharge status: Home / Self Care | Payer: Medicare Other | Source: Ambulatory Visit | Attending: Internal Medicine | Admitting: Internal Medicine

## 2016-12-14 DIAGNOSIS — E0590 Thyrotoxicosis, unspecified without thyrotoxic crisis or storm: Secondary | ICD-10-CM | POA: Diagnosis not present

## 2016-12-14 MED ORDER — SODIUM PERTECHNETATE TC 99M INJECTION
9.9000 | Freq: Once | INTRAVENOUS | Status: AC | PRN
  Administered 2016-12-14: 9.9 via INTRAVENOUS
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
## 2016-12-16 ENCOUNTER — Encounter: Payer: Self-pay | Admitting: Family Medicine

## 2016-12-16 ENCOUNTER — Ambulatory Visit (INDEPENDENT_AMBULATORY_CARE_PROVIDER_SITE_OTHER)
Admission: RE | Admit: 2016-12-16 | Discharge: 2016-12-16 | Disposition: A | Discharge status: Home / Self Care | Payer: Medicare Other | Source: Ambulatory Visit | Attending: Family Medicine | Admitting: Family Medicine

## 2016-12-16 ENCOUNTER — Other Ambulatory Visit (INDEPENDENT_AMBULATORY_CARE_PROVIDER_SITE_OTHER): Payer: Medicare Other

## 2016-12-16 ENCOUNTER — Ambulatory Visit (INDEPENDENT_AMBULATORY_CARE_PROVIDER_SITE_OTHER): Payer: Medicare Other | Admitting: Family Medicine

## 2016-12-16 VITALS — BP 152/80 | HR 82 | Ht 68.5 in | Wt 156.0 lb

## 2016-12-16 DIAGNOSIS — M25511 Pain in right shoulder: Secondary | ICD-10-CM | POA: Diagnosis not present

## 2016-12-16 DIAGNOSIS — M25551 Pain in right hip: Secondary | ICD-10-CM

## 2016-12-16 DIAGNOSIS — M5440 Lumbago with sciatica, unspecified side: Secondary | ICD-10-CM

## 2016-12-16 DIAGNOSIS — M2550 Pain in unspecified joint: Secondary | ICD-10-CM | POA: Diagnosis not present

## 2016-12-16 DIAGNOSIS — E0500 Thyrotoxicosis with diffuse goiter without thyrotoxic crisis or storm: Secondary | ICD-10-CM | POA: Diagnosis not present

## 2016-12-16 DIAGNOSIS — G8929 Other chronic pain: Secondary | ICD-10-CM

## 2016-12-16 DIAGNOSIS — M1611 Unilateral primary osteoarthritis, right hip: Secondary | ICD-10-CM | POA: Diagnosis not present

## 2016-12-16 LAB — CBC WITH DIFFERENTIAL/PLATELET
BASOS ABS: 0.1 10*3/uL (ref 0.0–0.1)
Basophils Relative: 1.2 % (ref 0.0–3.0)
Eosinophils Absolute: 0.1 10*3/uL (ref 0.0–0.7)
Eosinophils Relative: 1 % (ref 0.0–5.0)
HCT: 36.2 % — ABNORMAL LOW (ref 39.0–52.0)
Hemoglobin: 11.7 g/dL — ABNORMAL LOW (ref 13.0–17.0)
LYMPHS ABS: 1.4 10*3/uL (ref 0.7–4.0)
Lymphocytes Relative: 17.7 % (ref 12.0–46.0)
MCHC: 32.3 g/dL (ref 30.0–36.0)
MCV: 90.5 fl (ref 78.0–100.0)
MONO ABS: 1.1 10*3/uL — AB (ref 0.1–1.0)
MONOS PCT: 13.8 % — AB (ref 3.0–12.0)
NEUTROS ABS: 5.4 10*3/uL (ref 1.4–7.7)
NEUTROS PCT: 66.3 % (ref 43.0–77.0)
PLATELETS: 275 10*3/uL (ref 150.0–400.0)
RBC: 4 Mil/uL — AB (ref 4.22–5.81)
RDW: 14.6 % (ref 11.5–15.5)
WBC: 8.1 10*3/uL (ref 4.0–10.5)

## 2016-12-16 LAB — C-REACTIVE PROTEIN: CRP: 1 mg/dL (ref 0.5–20.0)

## 2016-12-16 LAB — SEDIMENTATION RATE: SED RATE: 78 mm/h — AB (ref 0–20)

## 2016-12-16 NOTE — Assessment & Plan Note (Signed)
History of spinal stenosis. We will see if that is giving some of the atrophy in the muscle of the legs. Do not feel that an EMG is necessary at this time. Possible workup including MRI of the lumbar spine for possible epidurals in the future. Patient does have a history of BPH within increasing elevation of a PSA being followed by urology. We will continue to monitor closely as well.

## 2016-12-16 NOTE — Assessment & Plan Note (Signed)
I believe the patient does have Graves' disease. Patient's PTU was started on long ago and I do not feel we should make any changes in the dosing at this time and patient is following up with endocrinology. I do believe the patient is having to an autoimmune induced myalgia that is likely contributing to most of the discomfort. Laboratory workup is started. We did discuss the possibility of prednisone but this can induce a potential for thyrotoxicosis after being on the medication. Patient would like to hold on this. We discussed over-the-counter medications a can help with some of the aches and pains at this moment. I'll like to see with labs show any other potential causes that her contribute into more of the systemic discomfort. We will see how patient does in 3-4 weeks and then consider to focus little more.

## 2016-12-16 NOTE — Patient Instructions (Addendum)
Good to see you  Curtis Vance is your friend.  Xray and labs downstairs today  I think the thyroid is the main cause After the biopsy start the other medicines Vitamin D 2000 IU daily  Tart cherry extract any dose  Turmeric 500mg  daily  See me again in 3 weeks

## 2016-12-16 NOTE — Assessment & Plan Note (Signed)
I believe it is secondary to more of the myalgia. Topical anti-inflammatories given a trial of, we discussed icing regimen, home exercises, which activities to do a which ones to avoid. Patient will be following up with me again in 3-4 weeks and we'll consider injection.

## 2016-12-17 LAB — RHEUMATOID FACTOR

## 2016-12-17 LAB — THYROID PEROXIDASE ANTIBODY: THYROID PEROXIDASE ANTIBODY: 2 [IU]/mL (ref <9)

## 2016-12-17 LAB — CALCIUM, IONIZED: Calcium, Ion: 5.3 mg/dL (ref 4.8–5.6)

## 2016-12-21 DIAGNOSIS — R972 Elevated prostate specific antigen [PSA]: Secondary | ICD-10-CM | POA: Diagnosis not present

## 2016-12-21 LAB — ANA: Anti Nuclear Antibody(ANA): NEGATIVE

## 2016-12-24 ENCOUNTER — Encounter: Payer: Self-pay | Admitting: Endocrinology

## 2016-12-24 ENCOUNTER — Ambulatory Visit (INDEPENDENT_AMBULATORY_CARE_PROVIDER_SITE_OTHER): Payer: Medicare Other | Admitting: Endocrinology

## 2016-12-24 VITALS — BP 132/74 | HR 74 | Wt 155.4 lb

## 2016-12-24 DIAGNOSIS — E0590 Thyrotoxicosis, unspecified without thyrotoxic crisis or storm: Secondary | ICD-10-CM | POA: Diagnosis not present

## 2016-12-24 LAB — T4, FREE: Free T4: 3.46 ng/dL — ABNORMAL HIGH (ref 0.60–1.60)

## 2016-12-24 LAB — TSH

## 2016-12-24 MED ORDER — METHIMAZOLE 10 MG PO TABS: 20.0000 mg | ORAL_TABLET | Freq: Two times a day (BID) | ORAL | 5 refills | Status: DC

## 2016-12-24 NOTE — Patient Instructions (Signed)
blood tests are requested for you today.  We'll let you know about the results. Based on there results, we'll change to another pill called "methimazole." If ever you have fever while taking methimazole, stop it and call us, even if the reason is obvious, because of the risk of a rare side-effect. Please come back for a follow-up appointment in 1 month.
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
## 2016-12-30 ENCOUNTER — Ambulatory Visit (INDEPENDENT_AMBULATORY_CARE_PROVIDER_SITE_OTHER): Payer: Medicare Other | Admitting: Internal Medicine

## 2016-12-30 ENCOUNTER — Ambulatory Visit (INDEPENDENT_AMBULATORY_CARE_PROVIDER_SITE_OTHER)
Admission: RE | Admit: 2016-12-30 | Discharge: 2016-12-30 | Disposition: A | Discharge status: Home / Self Care | Payer: Medicare Other | Source: Ambulatory Visit | Attending: Internal Medicine | Admitting: Internal Medicine

## 2016-12-30 ENCOUNTER — Encounter: Payer: Self-pay | Admitting: Internal Medicine

## 2016-12-30 VITALS — BP 138/70 | HR 94 | Temp 97.8°F | Resp 16 | Ht 69.0 in | Wt 156.8 lb

## 2016-12-30 DIAGNOSIS — K2100 Gastro-esophageal reflux disease with esophagitis, without bleeding: Secondary | ICD-10-CM

## 2016-12-30 DIAGNOSIS — R0602 Shortness of breath: Secondary | ICD-10-CM | POA: Diagnosis not present

## 2016-12-30 DIAGNOSIS — R05 Cough: Secondary | ICD-10-CM

## 2016-12-30 DIAGNOSIS — R64 Cachexia: Secondary | ICD-10-CM | POA: Diagnosis not present

## 2016-12-30 DIAGNOSIS — R059 Cough, unspecified: Secondary | ICD-10-CM

## 2016-12-30 DIAGNOSIS — J449 Chronic obstructive pulmonary disease, unspecified: Secondary | ICD-10-CM | POA: Diagnosis not present

## 2016-12-30 MED ORDER — DRONABINOL 2.5 MG PO CAPS: 2.5000 mg | ORAL_CAPSULE | Freq: Two times a day (BID) | ORAL | 2 refills | Status: DC

## 2016-12-30 MED ORDER — PANTOPRAZOLE SODIUM 40 MG PO TBEC: 40.0000 mg | DELAYED_RELEASE_TABLET | Freq: Every day | ORAL | 1 refills | Status: DC

## 2016-12-30 NOTE — Patient Instructions (Signed)
Cough, Adult Coughing is a reflex that clears your throat and your airways. Coughing helps to heal and protect your lungs. It is normal to cough occasionally, but a cough that happens with other symptoms or lasts a long time may be a sign of a condition that needs treatment. A cough may last only 2-3 weeks (acute), or it may last longer than 8 weeks (chronic). What are the causes? Coughing is commonly caused by:  Breathing in substances that irritate your lungs.  A viral or bacterial respiratory infection.  Allergies.  Asthma.  Postnasal drip.  Smoking.  Acid backing up from the stomach into the esophagus (gastroesophageal reflux).  Certain medicines.  Chronic lung problems, including COPD (or rarely, lung cancer).  Other medical conditions such as heart failure.  Follow these instructions at home: Pay attention to any changes in your symptoms. Take these actions to help with your discomfort:  Take medicines only as told by your health care provider. ? If you were prescribed an antibiotic medicine, take it as told by your health care provider. Do not stop taking the antibiotic even if you start to feel better. ? Talk with your health care provider before you take a cough suppressant medicine.  Drink enough fluid to keep your urine clear or pale yellow.  If the air is dry, use a cold steam vaporizer or humidifier in your bedroom or your home to help loosen secretions.  Avoid anything that causes you to cough at work or at home.  If your cough is worse at night, try sleeping in a semi-upright position.  Avoid cigarette smoke. If you smoke, quit smoking. If you need help quitting, ask your health care provider.  Avoid caffeine.  Avoid alcohol.  Rest as needed.  Contact a health care provider if:  You have new symptoms.  You cough up pus.  Your cough does not get better after 2-3 weeks, or your cough gets worse.  You cannot control your cough with suppressant  medicines and you are losing sleep.  You develop pain that is getting worse or pain that is not controlled with pain medicines.  You have a fever.  You have unexplained weight loss.  You have night sweats. Get help right away if:  You cough up blood.  You have difficulty breathing.  Your heartbeat is very fast. This information is not intended to replace advice given to you by your health care provider. Make sure you discuss any questions you have with your health care provider. Document Released: 10/09/2010 Document Revised: 09/18/2015 Document Reviewed: 06/19/2014 Elsevier Interactive Patient Education  2017 Elsevier Inc.  
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
## 2017-01-04 DIAGNOSIS — R3912 Poor urinary stream: Secondary | ICD-10-CM | POA: Diagnosis not present

## 2017-01-04 DIAGNOSIS — R972 Elevated prostate specific antigen [PSA]: Secondary | ICD-10-CM | POA: Diagnosis not present

## 2017-01-04 DIAGNOSIS — R350 Frequency of micturition: Secondary | ICD-10-CM | POA: Diagnosis not present

## 2017-01-06 ENCOUNTER — Encounter: Payer: Self-pay | Admitting: Family Medicine

## 2017-01-06 ENCOUNTER — Ambulatory Visit (INDEPENDENT_AMBULATORY_CARE_PROVIDER_SITE_OTHER): Payer: Medicare Other | Admitting: Family Medicine

## 2017-01-06 DIAGNOSIS — M1611 Unilateral primary osteoarthritis, right hip: Secondary | ICD-10-CM

## 2017-01-06 DIAGNOSIS — M5440 Lumbago with sciatica, unspecified side: Secondary | ICD-10-CM | POA: Diagnosis not present

## 2017-01-06 MED ORDER — GABAPENTIN 100 MG PO CAPS: 100.0000 mg | ORAL_CAPSULE | Freq: Every day | ORAL | 3 refills | Status: DC

## 2017-01-06 MED ORDER — MELOXICAM 7.5 MG PO TABS: 7.5000 mg | ORAL_TABLET | Freq: Every day | ORAL | 0 refills | Status: DC

## 2017-01-06 MED ORDER — TIZANIDINE HCL 2 MG PO CAPS: 2.0000 mg | ORAL_CAPSULE | Freq: Three times a day (TID) | ORAL | 1 refills | Status: DC | PRN
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
## 2017-01-06 NOTE — Assessment & Plan Note (Signed)
Monitor. Discussed over-the-counter medications to be beneficial. We discussed icing regimen, which enters into which was to avoid. Follow-up again in 4-8 weeks

## 2017-01-06 NOTE — Assessment & Plan Note (Signed)
Spent  25 minutes with patient face-to-face and had greater than 50% of counseling including as described in assessment and plan.Patient is having more still myalgias overall. I think is secondary to more of the Graves' disease in the hyperthyroidism. Patient still having some difficulty. Patient given a low dose gabapentin and we'll watch for any type or urinary retention. In addition of this patient given a very small dose of a muscle relaxer as well as an anti-inflammatory and warned to watch for any type of GI discomfort. We discussed really want to do these on short time basis if necessary. Worsening symptoms we may need advance imaging of the lumbar spine as well as epidurals.

## 2017-01-06 NOTE — Patient Instructions (Signed)
Good to see you  I am sorry but until we get the thyroid under control we will have some trouble.  Meloxicam daily for 10 days then as needed.  Will need to stop this before any surgery  Zanaflex up to 3 times a day but may make yuou groggy  Gabapentin 100mg  at night Glucosamine 1500mg  daily  See me again in 6 weeks.

## 2017-01-10 ENCOUNTER — Other Ambulatory Visit: Payer: Self-pay | Admitting: Urology

## 2017-01-24 ENCOUNTER — Ambulatory Visit (INDEPENDENT_AMBULATORY_CARE_PROVIDER_SITE_OTHER): Payer: Medicare Other | Admitting: Endocrinology

## 2017-01-24 ENCOUNTER — Encounter: Payer: Self-pay | Admitting: Endocrinology

## 2017-01-24 VITALS — BP 168/82 | HR 81 | Wt 159.2 lb

## 2017-01-24 DIAGNOSIS — E0590 Thyrotoxicosis, unspecified without thyrotoxic crisis or storm: Secondary | ICD-10-CM

## 2017-01-24 DIAGNOSIS — Z23 Encounter for immunization: Secondary | ICD-10-CM

## 2017-01-24 NOTE — Patient Instructions (Signed)
blood tests are requested for you today.  We'll let you know about the results. If ever you have fever while taking methimazole, stop it and call us, even if the reason is obvious, because of the risk of a rare side-effect. Please come back for a follow-up appointment in 1 month.
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
## 2017-01-25 ENCOUNTER — Other Ambulatory Visit (INDEPENDENT_AMBULATORY_CARE_PROVIDER_SITE_OTHER): Payer: Medicare Other

## 2017-01-25 DIAGNOSIS — E0590 Thyrotoxicosis, unspecified without thyrotoxic crisis or storm: Secondary | ICD-10-CM | POA: Diagnosis not present

## 2017-01-25 LAB — T4, FREE: Free T4: 1.93 ng/dL — ABNORMAL HIGH (ref 0.60–1.60)

## 2017-01-25 LAB — TSH: TSH: <0.01 u[IU]/mL — ABNORMAL LOW (ref 0.35–4.50)

## 2017-01-31 ENCOUNTER — Other Ambulatory Visit: Payer: Self-pay

## 2017-01-31 NOTE — Patient Outreach (Signed)
[facility]) Care Management  01/31/2017  Curtis Vance 11/17/1943 [id number]    Medication Adherence call to [name] the reason for this call is because Curtis Vance is showing past due under [place] [employer]on Losartan 100 mg  Spoke to patients wife she said patient is no longer on this medication doctor took him off this medication two weeks ago.   [place] Management Direct Dial [phone]  Fax [phone] [email or web address]

## 2017-02-02 ENCOUNTER — Other Ambulatory Visit: Payer: Self-pay | Admitting: Family Medicine

## 2017-02-02 NOTE — Telephone Encounter (Signed)
Refill done.  

## 2017-02-07 ENCOUNTER — Telehealth: Payer: Self-pay | Admitting: Internal Medicine

## 2017-02-07 DIAGNOSIS — I10 Essential (primary) hypertension: Secondary | ICD-10-CM | POA: Diagnosis not present

## 2017-02-07 NOTE — Telephone Encounter (Signed)
Left vm for Hope that this was faxed back.   I called pt.  Patients spouse states that patient had decided not to have procedure done and cancelled a month ago.  States will follow up in regard.

## 2017-02-07 NOTE — Telephone Encounter (Signed)
Can you let them know that this has been faxed back?  Can you call pt and let pt know the same.

## 2017-02-07 NOTE — Telephone Encounter (Signed)
Patient is scheduled for surgery.  States faxed clearance over.  Would like to know if received and will fax over again.  Did notify that provider has been out of the office.  Is requesting call back today.  States patient is suppose to be off aspirin 5 days prior and surgery is scheduled for this Friday. Please follow up in regard.

## 2017-02-11 ENCOUNTER — Encounter (HOSPITAL_BASED_OUTPATIENT_CLINIC_OR_DEPARTMENT_OTHER): Payer: Self-pay

## 2017-02-11 ENCOUNTER — Ambulatory Visit (HOSPITAL_BASED_OUTPATIENT_CLINIC_OR_DEPARTMENT_OTHER): Admit: 2017-02-11 | Payer: Medicare Other | Admitting: Urology

## 2017-02-11 SURGERY — CYSTOSCOPY WITH INSERTION OF UROLIFT
Anesthesia: Choice

## 2017-02-17 ENCOUNTER — Ambulatory Visit (INDEPENDENT_AMBULATORY_CARE_PROVIDER_SITE_OTHER): Payer: Medicare Other | Admitting: Family Medicine

## 2017-02-17 ENCOUNTER — Other Ambulatory Visit (INDEPENDENT_AMBULATORY_CARE_PROVIDER_SITE_OTHER): Payer: Medicare Other

## 2017-02-17 ENCOUNTER — Encounter: Payer: Self-pay | Admitting: Family Medicine

## 2017-02-17 ENCOUNTER — Ambulatory Visit: Payer: Self-pay

## 2017-02-17 VITALS — BP 142/98 | HR 75 | Ht 68.5 in | Wt 163.0 lb

## 2017-02-17 DIAGNOSIS — M25551 Pain in right hip: Secondary | ICD-10-CM

## 2017-02-17 DIAGNOSIS — N401 Enlarged prostate with lower urinary tract symptoms: Secondary | ICD-10-CM | POA: Diagnosis not present

## 2017-02-17 DIAGNOSIS — M1611 Unilateral primary osteoarthritis, right hip: Secondary | ICD-10-CM

## 2017-02-17 DIAGNOSIS — E0590 Thyrotoxicosis, unspecified without thyrotoxic crisis or storm: Secondary | ICD-10-CM | POA: Diagnosis not present

## 2017-02-17 DIAGNOSIS — R300 Dysuria: Secondary | ICD-10-CM

## 2017-02-17 LAB — URINALYSIS, ROUTINE W REFLEX MICROSCOPIC
BILIRUBIN URINE: NEGATIVE
KETONES UR: NEGATIVE
NITRITE: NEGATIVE
Specific Gravity, Urine: 1.01 (ref 1.000–1.030)
Total Protein, Urine: NEGATIVE
Urine Glucose: NEGATIVE
Urobilinogen, UA: 0.2 (ref 0.0–1.0)
pH: 6 (ref 5.0–8.0)

## 2017-02-17 LAB — T4, FREE: Free T4: 1.6 ng/dL (ref 0.60–1.60)

## 2017-02-17 LAB — TSH: TSH: 0.09 u[IU]/mL — ABNORMAL LOW (ref 0.35–4.50)

## 2017-02-17 NOTE — Patient Instructions (Addendum)
Good to see you  We injected your hip today  I am still concern it is coming form your back.  If not better in a week I would like you to call me and we will get an MRI of your back and see if an epidural could be helpful.  If we decide to do it then see me again in 3 weeks after it.  But make an appointment with me in 3-4 weeks.

## 2017-02-17 NOTE — Assessment & Plan Note (Signed)
Patient given injection today with fairly good resolution of pain. I do believe that spinal stenosis is also playing a role. I also believe the patient's hyperthyroidism is causing some disabilities. We discussed with patient again about advance imaging but patient wants to hold on that at this time. We discussed icing regimen. A Shantz if no better we will get MRI of the back.

## 2017-02-17 NOTE — Assessment & Plan Note (Signed)
Patient states it is having some dysuria. We'll get an urinary analysis.
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
## 2017-02-18 MED ORDER — CEPHALEXIN 500 MG PO CAPS: 500.0000 mg | ORAL_CAPSULE | Freq: Two times a day (BID) | ORAL | 0 refills | Status: DC

## 2017-02-21 ENCOUNTER — Other Ambulatory Visit: Payer: Self-pay | Admitting: Internal Medicine

## 2017-02-24 ENCOUNTER — Encounter: Payer: Self-pay | Admitting: Endocrinology

## 2017-02-24 ENCOUNTER — Ambulatory Visit (INDEPENDENT_AMBULATORY_CARE_PROVIDER_SITE_OTHER): Payer: Medicare Other | Admitting: Endocrinology

## 2017-02-24 ENCOUNTER — Other Ambulatory Visit: Payer: Self-pay | Admitting: *Deleted

## 2017-02-24 VITALS — BP 146/84 | HR 83 | Ht 68.5 in | Wt 164.0 lb

## 2017-02-24 DIAGNOSIS — E0590 Thyrotoxicosis, unspecified without thyrotoxic crisis or storm: Secondary | ICD-10-CM | POA: Diagnosis not present

## 2017-02-24 LAB — T4, FREE: Free T4: 0.53 ng/dL — ABNORMAL LOW (ref 0.60–1.60)

## 2017-02-24 LAB — TSH: TSH: 1.85 u[IU]/mL (ref 0.35–4.50)

## 2017-02-24 MED ORDER — METHIMAZOLE 10 MG PO TABS: 10.0000 mg | ORAL_TABLET | Freq: Two times a day (BID) | ORAL | 5 refills | Status: DC

## 2017-02-24 MED ORDER — TIZANIDINE HCL 2 MG PO CAPS: 2.0000 mg | ORAL_CAPSULE | Freq: Three times a day (TID) | ORAL | 1 refills | Status: DC | PRN

## 2017-02-24 NOTE — Patient Instructions (Addendum)
blood tests are requested for you today.  We'll let you know about the results. If ever you have fever while taking methimazole, stop it and call us, even if the reason is obvious, because of the risk of a rare side-effect. Please come back for a follow-up appointment in 1 month.  When the thyroid is better, I would order for you the radioactive iodine pill.  It works like this: We would first check a thyroid "scan" (a special, but easy and painless type of thyroid x ray).  you go to the x-ray department of the hospital to swallow a pill, which contains a miniscule amount of radiation.  You will not notice any symptoms from this.  You will go back to the x-ray department the next day, to lie down in front of a camera.  The results of this will be sent to me.   Based on the results, i hope to order for you a treatment pill of radioactive iodine.  Although it is a larger amount of radiation, you will again notice no symptoms from this.  The pill is gone from your body in a few days (during which you should stay away from other people), but takes several months to work.  Therefore, please return here approximately 6-8 weeks after the treatment.  This treatment has been available for many years, and the only known side-effect is an underactive thyroid.  It is possible that i would eventually prescribe for you a thyroid hormone pill, which is very inexpensive.  You don't have to worry about side-effects of this thyroid hormone pill, because it is the same molecule your thyroid makes.
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
## 2017-02-26 ENCOUNTER — Telehealth: Payer: Self-pay | Admitting: Endocrinology

## 2017-02-26 NOTE — Telephone Encounter (Signed)
please call patient: With my apologies, please correct the message from a few days ago.  I see that you requested the radioactive iodine pill.  As we discussed, we check the 2-day test first.  you will receive a phone call, about a day and time for an appointment.

## 2017-02-28 NOTE — Telephone Encounter (Signed)
I called and spoke with patient's wife who was aware he was considering the radioactive iodine pill, that they should receive a call to have that set up. She stated the would notify husband & be expecting that call.

## 2017-03-01 ENCOUNTER — Encounter: Payer: Self-pay | Admitting: Internal Medicine

## 2017-03-01 ENCOUNTER — Other Ambulatory Visit (INDEPENDENT_AMBULATORY_CARE_PROVIDER_SITE_OTHER): Payer: Medicare Other

## 2017-03-01 ENCOUNTER — Ambulatory Visit: Payer: Medicare Other | Admitting: Internal Medicine

## 2017-03-01 VITALS — BP 160/100 | HR 70 | Temp 98.0°F | Resp 16 | Ht 68.5 in | Wt 165.2 lb

## 2017-03-01 DIAGNOSIS — N390 Urinary tract infection, site not specified: Secondary | ICD-10-CM | POA: Diagnosis not present

## 2017-03-01 DIAGNOSIS — I10 Essential (primary) hypertension: Secondary | ICD-10-CM

## 2017-03-01 DIAGNOSIS — E785 Hyperlipidemia, unspecified: Secondary | ICD-10-CM | POA: Diagnosis not present

## 2017-03-01 LAB — LIPID PANEL
CHOL/HDL RATIO: 4
Cholesterol: 216 mg/dL — ABNORMAL HIGH (ref 0–200)
HDL: 57.3 mg/dL (ref >39.00)
LDL CALC: 128 mg/dL — AB (ref 0–99)
NONHDL: 158.69
Triglycerides: 155 mg/dL — ABNORMAL HIGH (ref 0.0–149.0)
VLDL: 31 mg/dL (ref 0.0–40.0)

## 2017-03-01 LAB — URINALYSIS, ROUTINE W REFLEX MICROSCOPIC
Bilirubin Urine: NEGATIVE
Hgb urine dipstick: NEGATIVE
Nitrite: NEGATIVE
PH: 6 (ref 5.0–8.0)
RBC / HPF: NONE SEEN (ref 0–?)
SPECIFIC GRAVITY, URINE: 1.025 (ref 1.000–1.030)
TOTAL PROTEIN, URINE-UPE24: NEGATIVE
URINE GLUCOSE: NEGATIVE
UROBILINOGEN UA: 0.2 (ref 0.0–1.0)

## 2017-03-01 MED ORDER — CLONIDINE HCL 0.3 MG/24HR TD PTWK: 0.3000 mg | MEDICATED_PATCH | TRANSDERMAL | 1 refills | Status: DC

## 2017-03-01 NOTE — Patient Instructions (Signed)
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
## 2017-03-02 MED ORDER — ROSUVASTATIN CALCIUM 10 MG PO TABS: 10.0000 mg | ORAL_TABLET | Freq: Every day | ORAL | 1 refills | Status: DC

## 2017-03-04 LAB — CULTURE, URINE COMPREHENSIVE
MICRO NUMBER:: 81246295
RESULT:: NO GROWTH
SPECIMEN QUALITY: ADEQUATE

## 2017-03-10 ENCOUNTER — Ambulatory Visit: Payer: Medicare Other | Admitting: Internal Medicine

## 2017-03-10 ENCOUNTER — Encounter: Payer: Self-pay | Admitting: Family Medicine

## 2017-03-10 ENCOUNTER — Ambulatory Visit: Payer: Medicare Other | Admitting: Family Medicine

## 2017-03-10 DIAGNOSIS — M1611 Unilateral primary osteoarthritis, right hip: Secondary | ICD-10-CM

## 2017-03-10 MED ORDER — TIZANIDINE HCL 4 MG PO TABS: 4.0000 mg | ORAL_TABLET | Freq: Four times a day (QID) | ORAL | 0 refills | Status: DC | PRN

## 2017-03-10 NOTE — Assessment & Plan Note (Signed)
Patient is much better with conservative therapy.  No change in management at this time.  Follow-up again in 2 months

## 2017-03-10 NOTE — Patient Instructions (Signed)
Good to see you  Refilled zanaflex  See me again in 2 months
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
## 2017-03-16 ENCOUNTER — Telehealth: Payer: Self-pay

## 2017-03-16 NOTE — Telephone Encounter (Signed)
Melony from [facility] pre cert call today to get this patient authorization for radio active iodine treatment at [place] he needs this done today so that he can keep his Monday appointment- the cpt code is [phone]- thank you so much!!

## 2017-03-21 ENCOUNTER — Encounter (HOSPITAL_COMMUNITY): Payer: Medicare Other

## 2017-03-22 ENCOUNTER — Encounter (HOSPITAL_COMMUNITY): Payer: Medicare Other

## 2017-03-29 ENCOUNTER — Encounter: Payer: Self-pay | Admitting: Endocrinology

## 2017-03-29 ENCOUNTER — Ambulatory Visit: Payer: Medicare Other | Admitting: Endocrinology

## 2017-03-29 VITALS — BP 160/82 | HR 90 | Wt 168.6 lb

## 2017-03-29 DIAGNOSIS — E0590 Thyrotoxicosis, unspecified without thyrotoxic crisis or storm: Secondary | ICD-10-CM
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
## 2017-03-29 NOTE — Patient Instructions (Addendum)
Please come back for a follow-up appointment in 6 weeks.   Please stay off the methimazole. Please also see Dr Ronnald Ramp, to follow-up the blood pressure.   Based on the results of the scan, I hope to order for you a treatment pill of radioactive iodine.  Although it is a larger amount of radiation, you will again notice no symptoms from this.  The pill is gone from your body in a few days (during which you should stay away from other people), but takes several months to work.  Therefore, please return here approximately 6-8 weeks after the treatment.  This treatment has been available for many years, and the only known side-effect is an underactive thyroid.  It is possible that i would eventually prescribe for you a thyroid hormone pill, which is very inexpensive.  You don't have to worry about side-effects of this thyroid hormone pill, because it is the same molecule your thyroid makes.

## 2017-03-31 ENCOUNTER — Ambulatory Visit (HOSPITAL_COMMUNITY)
Admission: RE | Admit: 2017-03-31 | Discharge: 2017-03-31 | Disposition: A | Discharge status: Home / Self Care | Payer: Medicare Other | Source: Ambulatory Visit | Attending: Endocrinology | Admitting: Endocrinology

## 2017-03-31 DIAGNOSIS — E0590 Thyrotoxicosis, unspecified without thyrotoxic crisis or storm: Secondary | ICD-10-CM | POA: Insufficient documentation

## 2017-03-31 MED ORDER — SODIUM IODIDE I 131 CAPSULE
13.3900 | Freq: Once | INTRAVENOUS | Status: AC | PRN
  Administered 2017-03-31: 13.39 via ORAL

## 2017-04-01 ENCOUNTER — Encounter (HOSPITAL_COMMUNITY)
Admission: RE | Admit: 2017-04-01 | Discharge: 2017-04-01 | Disposition: A | Discharge status: Home / Self Care | Payer: Medicare Other | Source: Ambulatory Visit | Attending: Endocrinology | Admitting: Endocrinology

## 2017-04-01 ENCOUNTER — Other Ambulatory Visit: Payer: Self-pay | Admitting: Endocrinology

## 2017-04-01 DIAGNOSIS — E0590 Thyrotoxicosis, unspecified without thyrotoxic crisis or storm: Secondary | ICD-10-CM

## 2017-04-01 MED ORDER — SODIUM PERTECHNETATE TC 99M INJECTION
10.0000 | Freq: Once | INTRAVENOUS | Status: AC | PRN
  Administered 2017-04-01: 10 via INTRAVENOUS

## 2017-04-11 ENCOUNTER — Telehealth: Payer: Self-pay | Admitting: Endocrinology

## 2017-04-11 NOTE — Telephone Encounter (Signed)
Patient has been off thyroid med for over 1 month because of thyroid test (which had been rescheduled). Test was done over a week ago. Patient feeling effects from being off of medication-doesn't feel right-having headaches, shaking-losing hair.  He was told over a week ago that he would be prescribed a radioactive pill so that he can be scheduled to receive radioactive pill. Please call wife Holley Raring at ph# [phone] to advise

## 2017-04-11 NOTE — Telephone Encounter (Signed)
Please refer message to [facility]

## 2017-04-14 NOTE — Telephone Encounter (Signed)
Patient is calling on the status of radioactive pill to set up appointment Patient state this has been going on for to long, he has been off his medication and is not feeling good, she ask for a call today.

## 2017-04-14 NOTE — Telephone Encounter (Signed)
Dr. Loanne Drilling has asked me to route this to you. I was informed however that you are no longer doing our outgoing referrals?

## 2017-04-15 NOTE — Telephone Encounter (Signed)
This was a misunderstanding she is still doing outgoing referrals until we can take them over

## 2017-04-18 NOTE — Telephone Encounter (Signed)
Patient has been scheduled 12/26.

## 2017-04-21 ENCOUNTER — Encounter (HOSPITAL_COMMUNITY)
Admission: RE | Admit: 2017-04-21 | Discharge: 2017-04-21 | Disposition: A | Discharge status: Home / Self Care | Payer: Medicare Other | Source: Ambulatory Visit | Attending: Endocrinology | Admitting: Endocrinology

## 2017-04-21 DIAGNOSIS — E0590 Thyrotoxicosis, unspecified without thyrotoxic crisis or storm: Secondary | ICD-10-CM | POA: Diagnosis not present

## 2017-04-21 MED ORDER — SODIUM IODIDE I 131 CAPSULE
14.0000 | Freq: Once | INTRAVENOUS | Status: AC | PRN
  Administered 2017-04-21: 14 via ORAL

## 2017-04-22 ENCOUNTER — Other Ambulatory Visit: Payer: Self-pay | Admitting: Internal Medicine

## 2017-04-22 ENCOUNTER — Telehealth: Payer: Self-pay | Admitting: Internal Medicine

## 2017-04-22 DIAGNOSIS — J111 Influenza due to unidentified influenza virus with other respiratory manifestations: Secondary | ICD-10-CM

## 2017-04-22 MED ORDER — OSELTAMIVIR PHOSPHATE 75 MG PO CAPS: 75.0000 mg | ORAL_CAPSULE | Freq: Two times a day (BID) | ORAL | 0 refills | Status: AC

## 2017-04-22 NOTE — Telephone Encounter (Signed)
Copied from [place] [id number]. Topic: Quick Communication - See Telephone Encounter >> Apr 22, 2017 12:01 PM Ether Griffins B wrote: CRM for notification. See Telephone encounter for:  Curtis Vance was diagnosed with the Flu on Monday and now Curtis Vance is showing flu symptoms and had radioactive treatment yesterday and cant come in to the office. Could antibiotic and tamiflu (generic)  be called in. Pt has body aches cough congestion and keeps getting hot then cold.  04/22/17.

## 2017-04-23 ENCOUNTER — Ambulatory Visit (INDEPENDENT_AMBULATORY_CARE_PROVIDER_SITE_OTHER): Payer: Medicare Other | Admitting: Family Medicine

## 2017-04-23 ENCOUNTER — Encounter: Payer: Self-pay | Admitting: Family Medicine

## 2017-04-23 VITALS — BP 154/86 | HR 76 | Temp 97.7°F | Wt 160.0 lb

## 2017-04-23 DIAGNOSIS — J069 Acute upper respiratory infection, unspecified: Secondary | ICD-10-CM | POA: Diagnosis not present

## 2017-04-23 DIAGNOSIS — J441 Chronic obstructive pulmonary disease with (acute) exacerbation: Secondary | ICD-10-CM | POA: Diagnosis not present

## 2017-04-23 DIAGNOSIS — B9789 Other viral agents as the cause of diseases classified elsewhere: Secondary | ICD-10-CM

## 2017-04-23 MED ORDER — BENZONATATE 200 MG PO CAPS: 200.0000 mg | ORAL_CAPSULE | Freq: Three times a day (TID) | ORAL | 1 refills | Status: DC | PRN

## 2017-04-23 MED ORDER — PREDNISONE 10 MG PO TABS: ORAL_TABLET | ORAL | 0 refills | Status: DC

## 2017-04-23 MED ORDER — ALBUTEROL SULFATE HFA 108 (90 BASE) MCG/ACT IN AERS: 2.0000 | INHALATION_SPRAY | Freq: Four times a day (QID) | RESPIRATORY_TRACT | 0 refills | Status: DC | PRN

## 2017-04-23 NOTE — Patient Instructions (Signed)
Take the tamiflu - start it right away  Drink fluids and rest  flonase nasal spray over the counter helps congestion  Zyrtec or claritin over the counter helps runny nose  For cough-try tessalon  For wheezing - take the prednisone as directed  Also try the albuterol inhaler as needed    Update if not starting to improve in a week or if worsening  - especially if wheezing gets any worse
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
## 2017-04-24 DIAGNOSIS — B9789 Other viral agents as the cause of diseases classified elsewhere: Secondary | ICD-10-CM

## 2017-04-24 DIAGNOSIS — J069 Acute upper respiratory infection, unspecified: Secondary | ICD-10-CM | POA: Insufficient documentation

## 2017-04-24 NOTE — Assessment & Plan Note (Signed)
And exacerbation of copd- (see a/p)  Exp to flu at home- enc him to pick up tamiflu now and start it

## 2017-04-24 NOTE — Assessment & Plan Note (Signed)
With uri symptoms and recent exp to flu B  No fever  Reassuring exam  Did enc STRONGLY to pick up the tamiflu that was sent in for him this week  Continue symbicort  Sent in albuterol hfa for rescule Prednisone 30 mg taper  Disc symptomatic care - see instructions on AVS :  Take the tamiflu - start it right away  Drink fluids and rest  flonase nasal spray over the counter helps congestion  Zyrtec or claritin over the counter helps runny nose  For cough-try tessalon  For wheezing - take the prednisone as directed  Also try the albuterol inhaler as needed   Update if not starting to improve in a week or if worsening

## 2017-04-25 ENCOUNTER — Other Ambulatory Visit: Payer: Self-pay | Admitting: *Deleted

## 2017-04-25 MED ORDER — ALBUTEROL SULFATE HFA 108 (90 BASE) MCG/ACT IN AERS: 2.0000 | INHALATION_SPRAY | Freq: Four times a day (QID) | RESPIRATORY_TRACT | 0 refills | Status: DC | PRN

## 2017-04-25 NOTE — Telephone Encounter (Signed)
Dr. Glori Bickers [facility] received fax saying insurance will only pay for proair inhaler, Rx changed

## 2017-05-05 ENCOUNTER — Ambulatory Visit (INDEPENDENT_AMBULATORY_CARE_PROVIDER_SITE_OTHER)
Admission: RE | Admit: 2017-05-05 | Discharge: 2017-05-05 | Disposition: A | Discharge status: Home / Self Care | Payer: Medicare Other | Source: Ambulatory Visit | Attending: Internal Medicine | Admitting: Internal Medicine

## 2017-05-05 ENCOUNTER — Encounter: Payer: Self-pay | Admitting: Internal Medicine

## 2017-05-05 ENCOUNTER — Ambulatory Visit: Payer: Medicare Other | Admitting: Internal Medicine

## 2017-05-05 VITALS — BP 124/68 | HR 105 | Temp 98.6°F | Ht 68.0 in | Wt 157.8 lb

## 2017-05-05 DIAGNOSIS — R05 Cough: Secondary | ICD-10-CM

## 2017-05-05 DIAGNOSIS — J988 Other specified respiratory disorders: Secondary | ICD-10-CM | POA: Insufficient documentation

## 2017-05-05 DIAGNOSIS — R059 Cough, unspecified: Secondary | ICD-10-CM

## 2017-05-05 MED ORDER — AMOXICILLIN-POT CLAVULANATE 875-125 MG PO TABS: 1.0000 | ORAL_TABLET | Freq: Two times a day (BID) | ORAL | 0 refills | Status: AC

## 2017-05-05 MED ORDER — HYDROCODONE-HOMATROPINE 5-1.5 MG/5ML PO SYRP: 5.0000 mL | ORAL_SOLUTION | Freq: Three times a day (TID) | ORAL | 0 refills | Status: AC | PRN

## 2017-05-05 NOTE — Patient Instructions (Signed)
Cough, Adult  Coughing is a reflex that clears your throat and your airways. Coughing helps to heal and protect your lungs. It is normal to cough occasionally, but a cough that happens with other symptoms or lasts a long time may be a sign of a condition that needs treatment. A cough may last only 2-3 weeks (acute), or it may last longer than 8 weeks (chronic).  What are the causes?  Coughing is commonly caused by:   Breathing in substances that irritate your lungs.   A viral or bacterial respiratory infection.   Allergies.   Asthma.   Postnasal drip.   Smoking.   Acid backing up from the stomach into the esophagus (gastroesophageal reflux).   Certain medicines.   Chronic lung problems, including COPD (or rarely, lung cancer).   Other medical conditions such as heart failure.    Follow these instructions at home:  Pay attention to any changes in your symptoms. Take these actions to help with your discomfort:   Take medicines only as told by your health care provider.  ? If you were prescribed an antibiotic medicine, take it as told by your health care provider. Do not stop taking the antibiotic even if you start to feel better.  ? Talk with your health care provider before you take a cough suppressant medicine.   Drink enough fluid to keep your urine clear or pale yellow.   If the air is dry, use a cold steam vaporizer or humidifier in your bedroom or your home to help loosen secretions.   Avoid anything that causes you to cough at work or at home.   If your cough is worse at night, try sleeping in a semi-upright position.   Avoid cigarette smoke. If you smoke, quit smoking. If you need help quitting, ask your health care provider.   Avoid caffeine.   Avoid alcohol.   Rest as needed.    Contact a health care provider if:   You have new symptoms.   You cough up pus.   Your cough does not get better after 2-3 weeks, or your cough gets worse.   You cannot control your cough with suppressant  medicines and you are losing sleep.   You develop pain that is getting worse or pain that is not controlled with pain medicines.   You have a fever.   You have unexplained weight loss.   You have night sweats.  Get help right away if:   You cough up blood.   You have difficulty breathing.   Your heartbeat is very fast.  This information is not intended to replace advice given to you by your health care provider. Make sure you discuss any questions you have with your health care provider.  Document Released: 10/09/2010 Document Revised: 09/18/2015 Document Reviewed: 06/19/2014  Elsevier Interactive Patient Education  2018 [employer]

## 2017-05-05 NOTE — Progress Notes (Signed)
Subjective:  Patient ID: Curtis Vance, male    DOB: 11/17/1943  Age: 72 y.o. MRN: [id number]  CC: Cough   HPI NICCO REAUME presents for a 2-week history of cough that is productive of thick green phlegm.  He also complains of shortness of breath and night sweats.  He was seen elsewhere recently and was given a prescription for [facility] which has not helped with the cough and prednisone which did help some.  He also complains that his ears feel stopped up.  Outpatient Medications Prior to Visit  Medication Sig Dispense Refill  . albuterol (PROAIR HFA) 108 (90 Base) MCG/ACT inhaler Inhale 2 puffs into the lungs every 6 (six) hours as needed for wheezing or shortness of breath. 18 g 0  . Ascorbic Acid (VITAMIN C) 1000 MG tablet Take 1,000 mg by mouth daily.    Marland Kitchen aspirin 81 MG tablet Take 81 mg by mouth daily.    . Cholecalciferol (VITAMIN D) 2000 units CAPS Take 1 capsule by mouth.    . cloNIDine (CATAPRES - DOSED IN MG/24 HR) 0.3 mg/24hr patch Place 1 patch (0.3 mg total) once a week onto the skin. 12 patch 1  . GARLIC PO Take 1 capsule by mouth daily.    Marland Kitchen KLOR-CON M20 20 MEQ tablet TAKE 1 TABLET (20 MEQ TOTAL) BY MOUTH DAILY. 90 tablet 1  . Misc Natural Products (TART CHERRY ADVANCED PO) Take 1,000 mg by mouth 2 (two) times daily.    Marland Kitchen OVER THE COUNTER MEDICATION 500 mg.    . rosuvastatin (CRESTOR) 10 MG tablet Take 1 tablet (10 mg total) daily by mouth. 90 tablet 1  . SYMBICORT 160-4.5 MCG/ACT inhaler INHALE 2 PUFFS INTO THE LUNGS EVERY MORNING AND 2 PUFFS 12 HOURS LATER 10.2 Inhaler 6  . tiZANidine (ZANAFLEX) 4 MG tablet Take 1 tablet (4 mg total) every 6 (six) hours as needed by mouth for muscle spasms. 30 tablet 0  . benzonatate (TESSALON) 200 MG capsule Take 1 capsule (200 mg total) by mouth 3 (three) times daily as needed. Swallow whole/do not bite pill 30 capsule 1  . predniSONE (DELTASONE) 10 MG tablet Take 3 pills once daily by mouth for 3 days, then 2 pills once  daily for 3 days, then 1 pill once daily for 3 days and then stop 18 tablet 0   No facility-administered medications prior to visit.     ROS Review of Systems  Constitutional: Positive for chills. Negative for appetite change, fatigue, fever and unexpected weight change.  HENT: Negative.  Negative for facial swelling, sinus pressure, sore throat and trouble swallowing.   Eyes: Negative for visual disturbance.  Respiratory: Positive for cough and shortness of breath. Negative for choking, chest tightness, wheezing and stridor.   Cardiovascular: Negative.  Negative for chest pain, palpitations and leg swelling.  Gastrointestinal: Negative for abdominal pain, constipation, diarrhea, nausea and vomiting.  Endocrine: Negative.   Genitourinary: Negative.   Musculoskeletal: Negative.  Negative for arthralgias, myalgias and neck pain.  Skin: Negative.  Negative for color change and rash.  Allergic/Immunologic: Negative.   Neurological: Negative.  Negative for dizziness.  Hematological: Negative for adenopathy. Does not bruise/bleed easily.  Psychiatric/Behavioral: Negative.     Objective:  BP 124/68 (BP Location: Left Arm, Patient Position: Sitting, Cuff Size: Normal)   Pulse (!) 105   Temp 98.6 F (37 C) (Oral)   Ht 5\' 8"  (1.727 m)   Wt 157 lb 12 oz (71.6 kg)  SpO2 95%   BMI 23.99 kg/m   BP Readings from Last 3 Encounters:  05/05/17 124/68  04/23/17 (!) 154/86  03/29/17 (!) 160/82    Wt Readings from Last 3 Encounters:  05/05/17 157 lb 12 oz (71.6 kg)  04/23/17 160 lb (72.6 kg)  03/29/17 168 lb 9.6 oz (76.5 kg)    Physical Exam  Constitutional: He is oriented to person, place, and time. No distress.  HENT:  Mouth/Throat: Oropharynx is clear and moist. No oropharyngeal exudate.  Eyes: Conjunctivae are normal. Left eye exhibits no discharge. No scleral icterus.  Neck: Normal range of motion. Neck supple. No JVD present. No thyromegaly present.  Cardiovascular: Normal rate,  regular rhythm and normal heart sounds.  No murmur heard. Pulmonary/Chest: Effort normal. No accessory muscle usage. No tachypnea. No respiratory distress. He has no decreased breath sounds. He has no wheezes. He has rhonchi in the left lower field. He has rales in the left lower field.  Abdominal: Soft. Bowel sounds are normal. He exhibits no mass. There is no tenderness. There is no guarding.  Musculoskeletal: Normal range of motion. He exhibits no edema or tenderness.  Lymphadenopathy:    He has no cervical adenopathy.  Neurological: He is alert and oriented to person, place, and time.  Skin: Skin is warm and dry. No rash noted. He is not diaphoretic. No erythema. No pallor.  Vitals reviewed.   Lab Results  Component Value Date   WBC 8.1 12/16/2016   HGB 11.7 (L) 12/16/2016   HCT 36.2 (L) 12/16/2016   PLT 275.0 12/16/2016   GLUCOSE 101 (H) 11/03/2016   CHOL 216 (H) 03/01/2017   TRIG 155.0 (H) 03/01/2017   HDL 57.30 03/01/2017   LDLCALC 128 (H) 03/01/2017   ALT 68 (H) 11/03/2016   AST 28 11/03/2016   NA 136 11/03/2016   K 4.7 11/03/2016   CL 101 11/03/2016   CREATININE 0.80 11/03/2016   BUN 24 (H) 11/03/2016   CO2 28 11/03/2016   TSH 1.85 02/24/2017   PSA 6.37 (H) 09/23/2016   HGBA1C 5.1 12/15/2015    Nm Rai Therapy For Hyperthyroidism  Result Date: 04/21/2017 CLINICAL DATA:  Hyperthyroidism. Graves disease. Patient failed a trial capsule. Symptoms include an elevated blood pressure, LVAD weight, and tremors. EXAM: RADIOACTIVE IODINE THERAPY FOR HYPERTHYROIDISM COMPARISON:  04/01/2017, thyroid uptake and scan TECHNIQUE: Radioactive iodine prescribed by Dr. Leonia Reeves . The risks and benefits of radioactive iodine therapy were discussed with the patient in detail by Dr. Jobe Igo. Alternative therapies were also mentioned. Radiation safety was discussed with the patient, including how to protect the general public from exposure. There were no barriers to communication. Written  consent was obtained. The patient then received a capsule containing the radiopharmaceutical. The patient will follow-up with the referring physician. RADIOPHARMACEUTICALS:  14.4 mCi I-131 sodium iodide orally IMPRESSION: Per oral administration of I-131 sodium iodide for the treatment of Graves disease. Electronically Signed   By: Suzy Bouchard M.D.   On: 04/21/2017 14:33    Dg Chest 2 View  Result Date: 05/05/2017 CLINICAL DATA:  Cough and wheezing for several weeks EXAM: CHEST  2 VIEW COMPARISON:  12/30/2016 FINDINGS: The heart size and mediastinal contours are within normal limits. Both lungs are clear. The visualized skeletal structures are unremarkable. IMPRESSION: No active cardiopulmonary disease. Electronically Signed   By: Inez Catalina M.D.   On: 05/05/2017 14:12   Nm Rai Therapy For Hyperthyroidism  Result Date: 04/21/2017 CLINICAL DATA:  Hyperthyroidism. Graves disease. Patient  failed a trial capsule. Symptoms include an elevated blood pressure, LVAD weight, and tremors. EXAM: RADIOACTIVE IODINE THERAPY FOR HYPERTHYROIDISM COMPARISON:  04/01/2017, thyroid uptake and scan TECHNIQUE: Radioactive iodine prescribed by Dr. Leonia Reeves . The risks and benefits of radioactive iodine therapy were discussed with the patient in detail by Dr. Jobe Igo. Alternative therapies were also mentioned. Radiation safety was discussed with the patient, including how to protect the general public from exposure. There were no barriers to communication. Written consent was obtained. The patient then received a capsule containing the radiopharmaceutical. The patient will follow-up with the referring physician. RADIOPHARMACEUTICALS:  14.4 mCi I-131 sodium iodide orally IMPRESSION: Per oral administration of I-131 sodium iodide for the treatment of Graves disease. Electronically Signed   By: Suzy Bouchard M.D.   On: 04/21/2017 14:33    Assessment & Plan:   Donnovan was seen today for cough.  Diagnoses and all orders  for this visit:  Cough- His chest x-ray is negative for edema, mass, or infiltrate. -     DG Chest 2 View; Future  RTI (respiratory tract infection)- I will treat the infection with Augmentin and will control the cough with Hycodan. -     amoxicillin-clavulanate (AUGMENTIN) 875-125 MG tablet; Take 1 tablet by mouth 2 (two) times daily for 10 days. -     HYDROcodone-homatropine (HYCODAN) 5-1.5 MG/5ML syrup; Take 5 mLs by mouth every 8 (eight) hours as needed for up to 7 days for cough.   I have discontinued Daisuke C. Copelin's predniSONE and benzonatate. I am also having him start on amoxicillin-clavulanate and HYDROcodone-homatropine. Additionally, I am having him maintain his aspirin, vitamin C, GARLIC PO, KLOR-CON M38, Vitamin D, Misc Natural Products (TART CHERRY ADVANCED PO), OVER THE COUNTER MEDICATION, SYMBICORT, cloNIDine, rosuvastatin, tiZANidine, and albuterol.  Meds ordered this encounter  Medications  . amoxicillin-clavulanate (AUGMENTIN) 875-125 MG tablet    Sig: Take 1 tablet by mouth 2 (two) times daily for 10 days.    Dispense:  20 tablet    Refill:  0  . HYDROcodone-homatropine (HYCODAN) 5-1.5 MG/5ML syrup    Sig: Take 5 mLs by mouth every 8 (eight) hours as needed for up to 7 days for cough.    Dispense:  120 mL    Refill:  0     Follow-up: Return if symptoms worsen or fail to improve.  Scarlette Calico, MD

## 2017-05-10 ENCOUNTER — Other Ambulatory Visit (INDEPENDENT_AMBULATORY_CARE_PROVIDER_SITE_OTHER): Payer: Medicare Other

## 2017-05-10 ENCOUNTER — Other Ambulatory Visit: Payer: Self-pay | Admitting: Family Medicine

## 2017-05-10 ENCOUNTER — Ambulatory Visit: Payer: Medicare Other | Admitting: Endocrinology

## 2017-05-10 ENCOUNTER — Encounter: Payer: Self-pay | Admitting: Endocrinology

## 2017-05-10 VITALS — BP 152/80 | HR 98 | Wt 161.2 lb

## 2017-05-10 DIAGNOSIS — E0590 Thyrotoxicosis, unspecified without thyrotoxic crisis or storm: Secondary | ICD-10-CM | POA: Diagnosis not present

## 2017-05-10 NOTE — Patient Instructions (Addendum)
blood tests are requested for you today.  We'll let you know about the results. Based on the results, you probably need to resume the methimazole, while the radioactive iodine pill is working. If ever you have fever while taking methimazole, stop it and call us, even if the reason is obvious, because of the risk of a rare side-effect.   Please come back for a follow-up appointment in 1 month.
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
## 2017-05-11 LAB — TSH

## 2017-05-11 LAB — T4, FREE: Free T4: 1.87 ng/dL — ABNORMAL HIGH (ref 0.60–1.60)

## 2017-05-12 ENCOUNTER — Ambulatory Visit: Payer: Self-pay

## 2017-05-12 ENCOUNTER — Encounter: Payer: Self-pay | Admitting: Family Medicine

## 2017-05-12 ENCOUNTER — Ambulatory Visit: Payer: Medicare Other | Admitting: Family Medicine

## 2017-05-12 VITALS — BP 120/70 | HR 68 | Ht 68.0 in | Wt 159.0 lb

## 2017-05-12 DIAGNOSIS — M25551 Pain in right hip: Secondary | ICD-10-CM

## 2017-05-12 DIAGNOSIS — M1611 Unilateral primary osteoarthritis, right hip: Secondary | ICD-10-CM | POA: Diagnosis not present
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
## 2017-05-12 NOTE — Assessment & Plan Note (Signed)
Repeat injection given today.  Tolerated procedure well.  We discussed icing regimen and home exercises.  We discussed which activities to do which wants to avoid.  Patient will consider possible replacement once patient's other health problems including the Graves' disease seems to be better controlled.

## 2017-05-12 NOTE — Patient Instructions (Signed)
Good to see you  We injected the hip again and I hope it helps.  Continue the vitamins Stay active.  I hope they get the thyroid taken care of and should make a difference see me again in 3 months otherwise.

## 2017-05-19 ENCOUNTER — Other Ambulatory Visit: Payer: Self-pay | Admitting: Family Medicine

## 2017-05-20 ENCOUNTER — Other Ambulatory Visit: Payer: Self-pay | Admitting: Family Medicine

## 2017-05-24 ENCOUNTER — Other Ambulatory Visit: Payer: Self-pay

## 2017-05-30 ENCOUNTER — Ambulatory Visit (INDEPENDENT_AMBULATORY_CARE_PROVIDER_SITE_OTHER): Payer: Medicare Other | Admitting: Neurology

## 2017-05-30 ENCOUNTER — Encounter: Payer: Self-pay | Admitting: Neurology

## 2017-05-30 ENCOUNTER — Other Ambulatory Visit: Payer: Self-pay

## 2017-05-30 VITALS — BP 154/88 | HR 78 | Ht 68.5 in | Wt 162.0 lb

## 2017-05-30 DIAGNOSIS — G3184 Mild cognitive impairment, so stated: Secondary | ICD-10-CM | POA: Diagnosis not present

## 2017-05-30 NOTE — Patient Instructions (Signed)
Continue with treatment of thyroid. Follow-up in 6 months, call for any changes  RECOMMENDATIONS FOR PATIENTS WITH MEMORY CHANGES: 1. Continue to exercise (Recommend 30 minutes of walking everyday, or 3 hours every week) 2. Increase social interactions - continue going to [place] and enjoy social gatherings with friends and family 3. Eat healthy, avoid fried foods and eat more fruits and vegetables 4. Maintain adequate blood pressure, blood sugar, and blood cholesterol level. Reducing the risk of stroke and cardiovascular disease also helps promoting better memory. 5. Avoid stressful situations. Live a simple life and avoid aggravations. Organize your time and prepare for the next day in anticipation. 6. Sleep well, avoid any interruptions of sleep and avoid any distractions in the bedroom that may interfere with adequate sleep quality 7. Avoid sugar, avoid sweets as there is a strong link between excessive sugar intake, diabetes, and cognitive impairment The Mediterranean diet has been shown to help patients reduce the risk of progressive memory disorders and reduces cardiovascular risk. This includes eating fish, eat fruits and green leafy vegetables, nuts like almonds and hazelnuts, walnuts, and also use olive oil. Avoid fast foods and fried foods as much as possible. Avoid sweets and sugar as sugar use has been linked to worsening of memory function.
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
## 2017-06-01 ENCOUNTER — Ambulatory Visit: Payer: Medicare Other | Admitting: Internal Medicine

## 2017-06-06 ENCOUNTER — Other Ambulatory Visit: Payer: Self-pay | Admitting: *Deleted

## 2017-06-06 MED ORDER — MELOXICAM 7.5 MG PO TABS: 7.5000 mg | ORAL_TABLET | Freq: Every day | ORAL | 0 refills | Status: DC | PRN

## 2017-06-08 ENCOUNTER — Ambulatory Visit: Payer: Medicare Other | Admitting: Endocrinology

## 2017-06-08 ENCOUNTER — Encounter: Payer: Self-pay | Admitting: Endocrinology

## 2017-06-08 VITALS — BP 190/108 | HR 100 | Wt 164.4 lb

## 2017-06-08 DIAGNOSIS — E0590 Thyrotoxicosis, unspecified without thyrotoxic crisis or storm: Secondary | ICD-10-CM | POA: Diagnosis not present

## 2017-06-08 LAB — TSH

## 2017-06-08 LAB — T4, FREE: FREE T4: 1.05 ng/dL (ref 0.60–1.60)

## 2017-06-08 MED ORDER — METHIMAZOLE 10 MG PO TABS: 10.0000 mg | ORAL_TABLET | Freq: Every day | ORAL | 1 refills | Status: DC

## 2017-06-08 NOTE — Patient Instructions (Addendum)
Your blood pressure is high today.  Please see Dr Ronnald Ramp soon, to have it rechecked.   blood tests are requested for you today.  We'll let you know about the results.   If ever you have fever while taking methimazole, stop it and call us, even if the reason is obvious, because of the risk of a rare side-effect.   Please come back for a follow-up appointment in 1 month.
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
## 2017-06-13 ENCOUNTER — Ambulatory Visit: Payer: Medicare Other | Admitting: Internal Medicine

## 2017-06-13 ENCOUNTER — Other Ambulatory Visit (INDEPENDENT_AMBULATORY_CARE_PROVIDER_SITE_OTHER): Payer: Medicare Other

## 2017-06-13 ENCOUNTER — Encounter: Payer: Self-pay | Admitting: Internal Medicine

## 2017-06-13 VITALS — BP 190/110 | HR 80 | Temp 97.7°F | Resp 16 | Ht 68.5 in | Wt 163.0 lb

## 2017-06-13 DIAGNOSIS — J449 Chronic obstructive pulmonary disease, unspecified: Secondary | ICD-10-CM

## 2017-06-13 DIAGNOSIS — I10 Essential (primary) hypertension: Secondary | ICD-10-CM | POA: Diagnosis not present

## 2017-06-13 DIAGNOSIS — R64 Cachexia: Secondary | ICD-10-CM

## 2017-06-13 LAB — URINALYSIS, ROUTINE W REFLEX MICROSCOPIC
Bilirubin Urine: NEGATIVE
HGB URINE DIPSTICK: NEGATIVE
Ketones, ur: NEGATIVE
NITRITE: NEGATIVE
RBC / HPF: NONE SEEN (ref 0–?)
Specific Gravity, Urine: 1.01 (ref 1.000–1.030)
Total Protein, Urine: NEGATIVE
Urine Glucose: NEGATIVE
Urobilinogen, UA: 1 (ref 0.0–1.0)
pH: 6 (ref 5.0–8.0)

## 2017-06-13 LAB — BASIC METABOLIC PANEL
BUN: 15 mg/dL (ref 6–23)
CO2: 28 mEq/L (ref 19–32)
Calcium: 9.3 mg/dL (ref 8.4–10.5)
Chloride: 102 mEq/L (ref 96–112)
Creatinine, Ser: 0.91 mg/dL (ref 0.40–1.50)
GFR: 104.29 mL/min (ref >60.00)
GLUCOSE: 91 mg/dL (ref 70–99)
POTASSIUM: 4.3 meq/L (ref 3.5–5.1)
SODIUM: 137 meq/L (ref 135–145)

## 2017-06-13 LAB — CBC WITH DIFFERENTIAL/PLATELET
BASOS PCT: 1.3 % (ref 0.0–3.0)
Basophils Absolute: 0.1 10*3/uL (ref 0.0–0.1)
EOS ABS: 0.3 10*3/uL (ref 0.0–0.7)
EOS PCT: 3.8 % (ref 0.0–5.0)
HEMATOCRIT: 38.7 % — AB (ref 39.0–52.0)
HEMOGLOBIN: 13 g/dL (ref 13.0–17.0)
Lymphocytes Relative: 19.5 % (ref 12.0–46.0)
Lymphs Abs: 1.5 10*3/uL (ref 0.7–4.0)
MCHC: 33.5 g/dL (ref 30.0–36.0)
MCV: 89.1 fl (ref 78.0–100.0)
MONO ABS: 0.9 10*3/uL (ref 0.1–1.0)
Monocytes Relative: 11.2 % (ref 3.0–12.0)
Neutro Abs: 5 10*3/uL (ref 1.4–7.7)
Neutrophils Relative %: 64.2 % (ref 43.0–77.0)
Platelets: 256 10*3/uL (ref 150.0–400.0)
RBC: 4.35 Mil/uL (ref 4.22–5.81)
RDW: 14.1 % (ref 11.5–15.5)
WBC: 7.8 10*3/uL (ref 4.0–10.5)

## 2017-06-13 MED ORDER — CHLORTHALIDONE 25 MG PO TABS: 25.0000 mg | ORAL_TABLET | Freq: Every day | ORAL | 0 refills | Status: DC

## 2017-06-13 MED ORDER — GLYCOPYRROLATE 25 MCG/ML IN SOLN: 1.0000 | Freq: Two times a day (BID) | RESPIRATORY_TRACT | 11 refills | Status: DC

## 2017-06-13 MED ORDER — GLYCOPYRROLATE 25 MCG/ML IN SOLN
1.0000 | Freq: Two times a day (BID) | RESPIRATORY_TRACT | 11 refills | Status: DC
Start: 2017-06-13 — End: 2017-06-14

## 2017-06-13 NOTE — Patient Instructions (Signed)

## 2017-06-13 NOTE — Progress Notes (Signed)
Subjective:  Patient ID: Curtis Vance, male    DOB: 11/17/1943  Age: 72 y.o. MRN: [id number]  CC: Hypertension   HPI [name] presents for f/up - He complains that his BP is not well controlled.  He got a cortisone shot in his hip about 3 weeks ago.  He also complains of chronic, intermittent cough with DOE.  The cough is productive of white phlegm.  He has gotten some improvement with the LABA/ICS inhaler.  Outpatient Medications Prior to Visit  Medication Sig Dispense Refill  . albuterol (PROAIR HFA) 108 (90 Base) MCG/ACT inhaler Inhale 2 puffs into the lungs every 6 (six) hours as needed for wheezing or shortness of breath. 18 g 0  . Ascorbic Acid (VITAMIN C) 1000 MG tablet Take 1,000 mg by mouth daily.    Marland Kitchen aspirin 81 MG tablet Take 81 mg by mouth daily.    . Cholecalciferol (VITAMIN D) 2000 units CAPS Take 1 capsule by mouth.    . cloNIDine (CATAPRES - DOSED IN MG/24 HR) 0.3 mg/24hr patch Place 1 patch (0.3 mg total) once a week onto the skin. 12 patch 1  . GARLIC PO Take 1 capsule by mouth daily.    Marland Kitchen KLOR-CON M20 20 MEQ tablet TAKE 1 TABLET (20 MEQ TOTAL) BY MOUTH DAILY. 90 tablet 1  . meloxicam (MOBIC) 7.5 MG tablet Take 1 tablet (7.5 mg total) by mouth daily as needed. 90 tablet 0  . methimazole (TAPAZOLE) 10 MG tablet Take 1 tablet (10 mg total) by mouth daily. 30 tablet 1  . Misc Natural Products (TART CHERRY ADVANCED PO) Take 1,000 mg by mouth 2 (two) times daily.    Marland Kitchen OVER THE COUNTER MEDICATION 500 mg.    . rosuvastatin (CRESTOR) 10 MG tablet Take 1 tablet (10 mg total) daily by mouth. 90 tablet 1  . SYMBICORT 160-4.5 MCG/ACT inhaler INHALE 2 PUFFS INTO THE LUNGS EVERY MORNING AND 2 PUFFS 12 HOURS LATER 10.2 Inhaler 6  . tiZANidine (ZANAFLEX) 4 MG tablet TAKE 1 TABLET (4 MG TOTAL) EVERY 6 (SIX) HOURS AS NEEDED BY MOUTH FOR MUSCLE SPASMS. 30 tablet 0   No facility-administered medications prior to visit.     ROS Review of Systems  Constitutional: Negative.   Negative for appetite change, chills, fatigue and fever.  HENT: Negative.  Negative for facial swelling, sinus pressure and trouble swallowing.   Respiratory: Positive for cough and shortness of breath. Negative for chest tightness and wheezing.   Cardiovascular: Negative for chest pain, palpitations and leg swelling.  Gastrointestinal: Negative for abdominal pain, diarrhea and vomiting.  Endocrine: Negative.   Genitourinary: Negative.  Negative for decreased urine volume, difficulty urinating, dysuria, hematuria and urgency.  Musculoskeletal: Negative.  Negative for back pain and myalgias.  Skin: Negative.  Negative for color change and pallor.  Neurological: Negative.  Negative for dizziness, weakness, light-headedness, numbness and headaches.  Hematological: Negative for adenopathy. Does not bruise/bleed easily.  Psychiatric/Behavioral: Negative.  Negative for suicidal ideas.    Objective:  BP (!) 190/110 (BP Location: Left Arm, Patient Position: Sitting, Cuff Size: Normal)   Pulse 80   Temp 97.7 F (36.5 C) (Oral)   Resp 16   Ht 5' 8.5" (1.74 m)   Wt 163 lb (73.9 kg)   SpO2 98%   BMI 24.42 kg/m   BP Readings from Last 3 Encounters:  06/13/17 (!) 190/110  06/08/17 (!) 190/108  05/30/17 (!) 154/88    Wt Readings from Last 3 Encounters:  06/13/17 163 lb (73.9 kg)  06/08/17 164 lb 6.4 oz (74.6 kg)  05/30/17 162 lb (73.5 kg)    Physical Exam  Constitutional: He is oriented to person, place, and time. No distress.  HENT:  Mouth/Throat: Oropharynx is clear and moist. No oropharyngeal exudate.  Eyes: Conjunctivae are normal. Left eye exhibits no discharge. No scleral icterus.  Neck: Normal range of motion. Neck supple. No JVD present. No thyromegaly present.  Cardiovascular: Normal rate and regular rhythm. Exam reveals no gallop.  No murmur heard. Pulmonary/Chest: Effort normal and breath sounds normal. No respiratory distress. He has no wheezes. He has no rales.    Abdominal: Soft. Bowel sounds are normal. He exhibits no distension and no mass. There is no tenderness. There is no guarding.  Musculoskeletal: Normal range of motion. He exhibits no edema, tenderness or deformity.  Lymphadenopathy:    He has no cervical adenopathy.  Neurological: He is alert and oriented to person, place, and time.  Skin: Skin is warm and dry. No rash noted. He is not diaphoretic. No erythema. No pallor.  Vitals reviewed.   Lab Results  Component Value Date   WBC 7.8 06/13/2017   HGB 13.0 06/13/2017   HCT 38.7 (L) 06/13/2017   PLT 256.0 06/13/2017   GLUCOSE 91 06/13/2017   CHOL 216 (H) 03/01/2017   TRIG 155.0 (H) 03/01/2017   HDL 57.30 03/01/2017   LDLCALC 128 (H) 03/01/2017   ALT 68 (H) 11/03/2016   AST 28 11/03/2016   NA 137 06/13/2017   K 4.3 06/13/2017   CL 102 06/13/2017   CREATININE 0.91 06/13/2017   BUN 15 06/13/2017   CO2 28 06/13/2017   TSH <0.01 (L) 06/08/2017   PSA 6.37 (H) 09/23/2016   HGBA1C 5.1 12/15/2015    Dg Chest 2 View  Result Date: 05/05/2017 CLINICAL DATA:  Cough and wheezing for several weeks EXAM: CHEST  2 VIEW COMPARISON:  12/30/2016 FINDINGS: The heart size and mediastinal contours are within normal limits. Both lungs are clear. The visualized skeletal structures are unremarkable. IMPRESSION: No active cardiopulmonary disease. Electronically Signed   By: Inez Catalina M.D.   On: 05/05/2017 14:12    Assessment & Plan:   Fredrik was seen today for hypertension.  Diagnoses and all orders for this visit:  Essential hypertension-his blood pressure is not adequately well controlled.  Labs are negative for any secondary causes or endorgan damage.  I will add chlorthalidone to the clonidine.  He will continue to work with endocrinology to treat his hyperthyroidism. -     chlorthalidone (HYGROTON) 25 MG tablet; Take 1 tablet (25 mg total) by mouth daily. 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 -     Glycopyrrolate (LONHALA MAGNAIR REFILL KIT) 25 MCG/ML SOLN; Inhale 1 Act into the lungs 2 (two) times daily. -     Glycopyrrolate (LONHALA MAGNAIR STARTER KIT) 25 MCG/ML SOLN; Inhale 1 Act into the lungs 2 (two) times daily.   I am having Estiven C. Rorrer start on Glycopyrrolate, Glycopyrrolate, and chlorthalidone. I am also having him maintain his aspirin, vitamin C, GARLIC PO, KLOR-CON O35, Vitamin D, Misc Natural Products (TART CHERRY ADVANCED PO), OVER THE COUNTER MEDICATION,  SYMBICORT, cloNIDine, rosuvastatin, albuterol, tiZANidine, meloxicam, and methimazole.  Meds ordered this encounter  Medications  . Glycopyrrolate (LONHALA MAGNAIR REFILL KIT) 25 MCG/ML SOLN    Sig: Inhale 1 Act into the lungs 2 (two) times daily.    Dispense:  60 mL    Refill:  11  . Glycopyrrolate (LONHALA MAGNAIR STARTER KIT) 25 MCG/ML SOLN    Sig: Inhale 1 Act into the lungs 2 (two) times daily.    Dispense:  60 mL    Refill:  11  . chlorthalidone (HYGROTON) 25 MG tablet    Sig: Take 1 tablet (25 mg total) by mouth daily.    Dispense:  90 tablet    Refill:  0     Follow-up: Return in about 4 weeks (around 07/11/2017).  Scarlette Calico, MD

## 2017-06-14 ENCOUNTER — Other Ambulatory Visit: Payer: Self-pay | Admitting: Internal Medicine

## 2017-06-14 DIAGNOSIS — J449 Chronic obstructive pulmonary disease, unspecified: Secondary | ICD-10-CM

## 2017-07-06 ENCOUNTER — Ambulatory Visit: Payer: Medicare Other | Admitting: Endocrinology

## 2017-07-06 ENCOUNTER — Encounter: Payer: Self-pay | Admitting: Endocrinology

## 2017-07-06 VITALS — BP 182/100 | HR 80 | Wt 170.0 lb

## 2017-07-06 DIAGNOSIS — E0590 Thyrotoxicosis, unspecified without thyrotoxic crisis or storm: Secondary | ICD-10-CM

## 2017-07-06 LAB — TSH: TSH: 6.54 u[IU]/mL — AB (ref 0.35–4.50)

## 2017-07-06 LAB — T4, FREE: FREE T4: 0.55 ng/dL — AB (ref 0.60–1.60)

## 2017-07-06 MED ORDER — LEVOTHYROXINE SODIUM 75 MCG PO TABS: 75.0000 ug | ORAL_TABLET | Freq: Every day | ORAL | 2 refills | Status: DC

## 2017-07-06 NOTE — Patient Instructions (Signed)
Your blood pressure is high today.  Please see Dr Ronnald Ramp soon, to have it rechecked.   blood tests are requested for you today.  We'll let you know about the results.   If ever you have fever while taking methimazole, stop it and call us, even if the reason is obvious, because of the risk of a rare side-effect.   Please come back for a follow-up appointment in 1 month.
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
## 2017-07-11 ENCOUNTER — Encounter: Payer: Self-pay | Admitting: Internal Medicine

## 2017-07-11 ENCOUNTER — Ambulatory Visit: Payer: Medicare Other | Admitting: Internal Medicine

## 2017-07-11 VITALS — BP 144/88 | HR 98 | Temp 98.0°F | Resp 16 | Ht 68.5 in | Wt 167.0 lb

## 2017-07-11 DIAGNOSIS — I10 Essential (primary) hypertension: Secondary | ICD-10-CM

## 2017-07-11 NOTE — Progress Notes (Signed)
Subjective:  Patient ID: Curtis Vance, male    DOB: 11/17/1943  Age: 72 y.o. MRN: [id number]  CC: Hypertension   HPI REEF ACHTERBERG presents for a BP check - His blood pressure has been well controlled since his last visit.  He feels well today and offers no complaints.  He has developed mild hypothyroidism status post radioactive iodine and has started a thyroid supplement.   Outpatient Medications Prior to Visit  Medication Sig Dispense Refill  . albuterol (PROAIR HFA) 108 (90 Base) MCG/ACT inhaler Inhale 2 puffs into the lungs every 6 (six) hours as needed for wheezing or shortness of breath. 18 g 0  . Ascorbic Acid (VITAMIN C) 1000 MG tablet Take 1,000 mg by mouth daily.    Marland Kitchen aspirin 81 MG tablet Take 81 mg by mouth daily.    . chlorthalidone (HYGROTON) 25 MG tablet Take 1 tablet (25 mg total) by mouth daily. 90 tablet 0  . Cholecalciferol (VITAMIN D) 2000 units CAPS Take 1 capsule by mouth.    . cloNIDine (CATAPRES - DOSED IN MG/24 HR) 0.3 mg/24hr patch Place 1 patch (0.3 mg total) once a week onto the skin. 12 patch 1  . GARLIC PO Take 1 capsule by mouth daily.    . Glycopyrrolate (LONHALA MAGNAIR REFILL KIT) 25 MCG/ML SOLN Inhale 1 Act into the lungs 2 (two) times daily. 60 mL 11  . KLOR-CON M20 20 MEQ tablet TAKE 1 TABLET (20 MEQ TOTAL) BY MOUTH DAILY. 90 tablet 1  . levothyroxine (SYNTHROID, LEVOTHROID) 75 MCG tablet Take 1 tablet (75 mcg total) by mouth daily before breakfast. 30 tablet 2  . LONHALA MAGNAIR STARTER KIT 25 MCG/ML SOLN Use one vial via Magnair device twice a day. Generic: Glycopyrrolate 60 mL 11  . meloxicam (MOBIC) 7.5 MG tablet Take 1 tablet (7.5 mg total) by mouth daily as needed. 90 tablet 0  . Misc Natural Products (TART CHERRY ADVANCED PO) Take 1,000 mg by mouth 2 (two) times daily.    Marland Kitchen OVER THE COUNTER MEDICATION 500 mg.    . rosuvastatin (CRESTOR) 10 MG tablet Take 1 tablet (10 mg total) daily by mouth. 90 tablet 1  . SYMBICORT 160-4.5 MCG/ACT  inhaler INHALE 2 PUFFS INTO THE LUNGS EVERY MORNING AND 2 PUFFS 12 HOURS LATER 10.2 Inhaler 6  . tiZANidine (ZANAFLEX) 4 MG tablet TAKE 1 TABLET (4 MG TOTAL) EVERY 6 (SIX) HOURS AS NEEDED BY MOUTH FOR MUSCLE SPASMS. 30 tablet 0   No facility-administered medications prior to visit.     ROS Review of Systems  Constitutional: Negative.  Negative for appetite change, diaphoresis, fatigue and unexpected weight change.  HENT: Negative.  Negative for trouble swallowing.   Eyes: Negative for visual disturbance.  Respiratory: Negative for cough, chest tightness, shortness of breath and wheezing.   Cardiovascular: Negative.  Negative for chest pain, palpitations and leg swelling.  Gastrointestinal: Negative for abdominal pain, blood in stool, constipation, diarrhea, nausea and vomiting.  Endocrine: Negative for cold intolerance and heat intolerance.  Genitourinary: Negative.  Negative for difficulty urinating.  Musculoskeletal: Negative.  Negative for arthralgias, back pain and myalgias.  Skin: Negative.  Negative for color change and pallor.  Allergic/Immunologic: Negative.   Neurological: Negative.  Negative for dizziness, weakness, light-headedness and numbness.  Hematological: Negative for adenopathy. Does not bruise/bleed easily.  Psychiatric/Behavioral: Negative.     Objective:  BP (!) 144/88   Pulse 98   Temp 98 F (36.7 C) (Oral)   Resp 16  Ht 5' 8.5" (1.74 m)   Wt 167 lb (75.8 kg)   SpO2 100%   BMI 25.02 kg/m   BP Readings from Last 3 Encounters:  07/11/17 (!) 144/88  07/06/17 (!) 182/100  06/13/17 (!) 190/110    Wt Readings from Last 3 Encounters:  07/11/17 167 lb (75.8 kg)  07/06/17 170 lb (77.1 kg)  06/13/17 163 lb (73.9 kg)    Physical Exam  Constitutional: He is oriented to person, place, and time. No distress.  HENT:  Mouth/Throat: Oropharynx is clear and moist. No oropharyngeal exudate.  Eyes: Conjunctivae are normal. Left eye exhibits no discharge. No  scleral icterus.  Neck: Normal range of motion. Neck supple. No JVD present. No thyromegaly present.  Cardiovascular: Normal rate, regular rhythm and normal heart sounds. Exam reveals no gallop.  No murmur heard. Pulmonary/Chest: Effort normal and breath sounds normal. No respiratory distress. He has no wheezes. He has no rales.  Abdominal: Soft. Bowel sounds are normal. He exhibits no distension and no mass. There is no tenderness. There is no guarding.  Musculoskeletal: Normal range of motion. He exhibits no edema, tenderness or deformity.  Lymphadenopathy:    He has no cervical adenopathy.  Neurological: He is alert and oriented to person, place, and time.  Skin: Skin is warm and dry. No rash noted. He is not diaphoretic. No erythema. No pallor.  Vitals reviewed.   Lab Results  Component Value Date   WBC 7.8 06/13/2017   HGB 13.0 06/13/2017   HCT 38.7 (L) 06/13/2017   PLT 256.0 06/13/2017   GLUCOSE 91 06/13/2017   CHOL 216 (H) 03/01/2017   TRIG 155.0 (H) 03/01/2017   HDL 57.30 03/01/2017   LDLCALC 128 (H) 03/01/2017   ALT 68 (H) 11/03/2016   AST 28 11/03/2016   NA 137 06/13/2017   K 4.3 06/13/2017   CL 102 06/13/2017   CREATININE 0.91 06/13/2017   BUN 15 06/13/2017   CO2 28 06/13/2017   TSH 6.54 (H) 07/06/2017   PSA 6.37 (H) 09/23/2016   HGBA1C 5.1 12/15/2015    Dg Chest 2 View  Result Date: 05/05/2017 CLINICAL DATA:  Cough and wheezing for several weeks EXAM: CHEST  2 VIEW COMPARISON:  12/30/2016 FINDINGS: The heart size and mediastinal contours are within normal limits. Both lungs are clear. The visualized skeletal structures are unremarkable. IMPRESSION: No active cardiopulmonary disease. Electronically Signed   By: Inez Catalina M.D.   On: 05/05/2017 14:12    Assessment & Plan:   [place] was seen today for hypertension.  Diagnoses and all orders for this visit:  Essential hypertension- His blood pressure is adequately well controlled.  Will continue the thiazide  diuretic and clonidine.  Will monitor him for vitamin D deficiency as well. -     VITAMIN D 25 Hydroxy (Vit-D Deficiency, Fractures); Future   I am having Hager C. Mattie maintain his aspirin, vitamin C, GARLIC PO, KLOR-CON F16, Vitamin D, Misc Natural Products (TART CHERRY ADVANCED PO), OVER THE COUNTER MEDICATION, SYMBICORT, cloNIDine, rosuvastatin, albuterol, tiZANidine, meloxicam, Glycopyrrolate, chlorthalidone, LONHALA MAGNAIR STARTER KIT, and levothyroxine.  No orders of the defined types were placed in this encounter.    Follow-up: Return in about 6 months (around 01/11/2018).  Scarlette Calico, MD

## 2017-07-11 NOTE — Patient Instructions (Signed)

## 2017-08-01 ENCOUNTER — Inpatient Hospital Stay (HOSPITAL_COMMUNITY)
Admission: EM | Admit: 2017-08-01 | Discharge: 2017-08-05 | DRG: 552 | Disposition: A | Discharge status: Home Health | Payer: Medicare Other | Attending: Neurosurgery | Admitting: Neurosurgery

## 2017-08-01 ENCOUNTER — Emergency Department (HOSPITAL_COMMUNITY): Payer: Medicare Other

## 2017-08-01 ENCOUNTER — Encounter (HOSPITAL_COMMUNITY): Payer: Self-pay

## 2017-08-01 ENCOUNTER — Other Ambulatory Visit: Payer: Self-pay

## 2017-08-01 DIAGNOSIS — Z87891 Personal history of nicotine dependence: Secondary | ICD-10-CM | POA: Diagnosis not present

## 2017-08-01 DIAGNOSIS — M79603 Pain in arm, unspecified: Secondary | ICD-10-CM

## 2017-08-01 DIAGNOSIS — Z981 Arthrodesis status: Secondary | ICD-10-CM

## 2017-08-01 DIAGNOSIS — J449 Chronic obstructive pulmonary disease, unspecified: Secondary | ICD-10-CM | POA: Diagnosis not present

## 2017-08-01 DIAGNOSIS — S129XXA Fracture of neck, unspecified, initial encounter: Secondary | ICD-10-CM

## 2017-08-01 DIAGNOSIS — Z833 Family history of diabetes mellitus: Secondary | ICD-10-CM

## 2017-08-01 DIAGNOSIS — M1611 Unilateral primary osteoarthritis, right hip: Secondary | ICD-10-CM | POA: Diagnosis not present

## 2017-08-01 DIAGNOSIS — M79601 Pain in right arm: Secondary | ICD-10-CM

## 2017-08-01 DIAGNOSIS — Y92410 Unspecified street and highway as the place of occurrence of the external cause: Secondary | ICD-10-CM

## 2017-08-01 DIAGNOSIS — Z8249 Family history of ischemic heart disease and other diseases of the circulatory system: Secondary | ICD-10-CM | POA: Diagnosis not present

## 2017-08-01 DIAGNOSIS — Z7982 Long term (current) use of aspirin: Secondary | ICD-10-CM

## 2017-08-01 DIAGNOSIS — Z79890 Hormone replacement therapy (postmenopausal): Secondary | ICD-10-CM

## 2017-08-01 DIAGNOSIS — T148XXA Other injury of unspecified body region, initial encounter: Secondary | ICD-10-CM | POA: Diagnosis not present

## 2017-08-01 DIAGNOSIS — Z888 Allergy status to other drugs, medicaments and biological substances status: Secondary | ICD-10-CM

## 2017-08-01 DIAGNOSIS — K219 Gastro-esophageal reflux disease without esophagitis: Secondary | ICD-10-CM | POA: Diagnosis not present

## 2017-08-01 DIAGNOSIS — S299XXA Unspecified injury of thorax, initial encounter: Secondary | ICD-10-CM | POA: Diagnosis not present

## 2017-08-01 DIAGNOSIS — Z86010 Personal history of colonic polyps: Secondary | ICD-10-CM | POA: Diagnosis not present

## 2017-08-01 DIAGNOSIS — E785 Hyperlipidemia, unspecified: Secondary | ICD-10-CM | POA: Diagnosis not present

## 2017-08-01 DIAGNOSIS — I10 Essential (primary) hypertension: Secondary | ICD-10-CM | POA: Diagnosis not present

## 2017-08-01 DIAGNOSIS — S12501A Unspecified nondisplaced fracture of sixth cervical vertebra, initial encounter for closed fracture: Secondary | ICD-10-CM

## 2017-08-01 DIAGNOSIS — S199XXA Unspecified injury of neck, initial encounter: Secondary | ICD-10-CM | POA: Diagnosis not present

## 2017-08-01 DIAGNOSIS — Z809 Family history of malignant neoplasm, unspecified: Secondary | ICD-10-CM | POA: Diagnosis not present

## 2017-08-01 DIAGNOSIS — N400 Enlarged prostate without lower urinary tract symptoms: Secondary | ICD-10-CM | POA: Diagnosis present

## 2017-08-01 DIAGNOSIS — S12500A Unspecified displaced fracture of sixth cervical vertebra, initial encounter for closed fracture: Secondary | ICD-10-CM | POA: Diagnosis not present

## 2017-08-01 DIAGNOSIS — S0990XA Unspecified injury of head, initial encounter: Secondary | ICD-10-CM | POA: Diagnosis not present

## 2017-08-01 DIAGNOSIS — M542 Cervicalgia: Secondary | ICD-10-CM | POA: Diagnosis not present

## 2017-08-01 DIAGNOSIS — R0602 Shortness of breath: Secondary | ICD-10-CM | POA: Diagnosis not present

## 2017-08-01 HISTORY — DX: Pure hypercholesterolemia, unspecified: E78.00

## 2017-08-01 HISTORY — DX: Hypothyroidism, unspecified: E03.9

## 2017-08-01 LAB — COMPREHENSIVE METABOLIC PANEL
ALK PHOS: 93 U/L (ref 38–126)
ALT: 31 U/L (ref 17–63)
AST: 67 U/L — ABNORMAL HIGH (ref 15–41)
Albumin: 4.5 g/dL (ref 3.5–5.0)
Anion gap: 8 (ref 5–15)
BILIRUBIN TOTAL: 1.1 mg/dL (ref 0.3–1.2)
BUN: 13 mg/dL (ref 6–20)
CALCIUM: 9.4 mg/dL (ref 8.9–10.3)
CO2: 25 mmol/L (ref 22–32)
Chloride: 101 mmol/L (ref 101–111)
Creatinine, Ser: 1.13 mg/dL (ref 0.61–1.24)
GFR calc non Af Amer: >60 mL/min (ref >60)
GLUCOSE: 100 mg/dL — AB (ref 65–99)
Potassium: 3.7 mmol/L (ref 3.5–5.1)
Sodium: 134 mmol/L — ABNORMAL LOW (ref 135–145)
TOTAL PROTEIN: 7.6 g/dL (ref 6.5–8.1)

## 2017-08-01 LAB — CBC
HCT: 39.3 % (ref 39.0–52.0)
Hemoglobin: 13.3 g/dL (ref 13.0–17.0)
MCH: 30.4 pg (ref 26.0–34.0)
MCHC: 33.8 g/dL (ref 30.0–36.0)
MCV: 89.9 fL (ref 78.0–100.0)
PLATELETS: 207 10*3/uL (ref 150–400)
RBC: 4.37 MIL/uL (ref 4.22–5.81)
RDW: 14.6 % (ref 11.5–15.5)
WBC: 11.4 10*3/uL — ABNORMAL HIGH (ref 4.0–10.5)

## 2017-08-01 LAB — PROTIME-INR
INR: 1.17
PROTHROMBIN TIME: 14.8 s (ref 11.4–15.2)

## 2017-08-01 LAB — APTT: aPTT: 30 seconds (ref 24–36)

## 2017-08-01 MED ORDER — IOPAMIDOL (ISOVUE-370) INJECTION 76%
INTRAVENOUS | Status: AC
  Filled 2017-08-01: qty 50

## 2017-08-01 MED ORDER — IOPAMIDOL (ISOVUE-370) INJECTION 76%
100.0000 mL | Freq: Once | INTRAVENOUS | Status: AC | PRN
  Administered 2017-08-01: 100 mL via INTRAVENOUS

## 2017-08-01 MED ORDER — ONDANSETRON HCL 4 MG/2ML IJ SOLN: 4.0000 mg | Freq: Once | INTRAMUSCULAR | Status: DC

## 2017-08-01 MED ORDER — MORPHINE SULFATE (PF) 4 MG/ML IV SOLN
4.0000 mg | Freq: Once | INTRAVENOUS | Status: DC
Start: 2017-08-01 — End: 2017-08-01

## 2017-08-01 MED ORDER — FENTANYL CITRATE (PF) 100 MCG/2ML IJ SOLN
50.0000 ug | Freq: Once | INTRAMUSCULAR | Status: AC
  Administered 2017-08-01: 50 ug via INTRAVENOUS
  Filled 2017-08-01: qty 2

## 2017-08-01 MED ORDER — HYDROCODONE-ACETAMINOPHEN 5-325 MG PO TABS
1.0000 | ORAL_TABLET | Freq: Once | ORAL | Status: AC
  Administered 2017-08-01: 1 via ORAL
  Filled 2017-08-01: qty 1

## 2017-08-01 NOTE — Discharge Instructions (Addendum)
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 Recommend annual imaging followup by CTA or MRA. This recommendation follows 2010 ACCF/AHA/AATS/ACR/ASA/SCA/SCAI/SIR/STS/SVM Guidelines for the Diagnosis and Management of Patients with Thoracic Aortic Disease.

## 2017-08-01 NOTE — ED Notes (Signed)
Patient transported to CT 

## 2017-08-01 NOTE — ED Notes (Signed)
Pt placed in C Collar .
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
## 2017-08-02 ENCOUNTER — Other Ambulatory Visit: Payer: Self-pay | Admitting: Internal Medicine

## 2017-08-02 ENCOUNTER — Encounter (HOSPITAL_COMMUNITY): Payer: Self-pay | Admitting: General Practice

## 2017-08-02 ENCOUNTER — Emergency Department (HOSPITAL_COMMUNITY): Payer: Medicare Other

## 2017-08-02 DIAGNOSIS — S12500A Unspecified displaced fracture of sixth cervical vertebra, initial encounter for closed fracture: Secondary | ICD-10-CM | POA: Diagnosis present

## 2017-08-02 DIAGNOSIS — S129XXA Fracture of neck, unspecified, initial encounter: Secondary | ICD-10-CM | POA: Diagnosis present

## 2017-08-02 MED ORDER — SODIUM CHLORIDE 0.9% FLUSH: 3.0000 mL | INTRAVENOUS | Status: DC | PRN

## 2017-08-02 MED ORDER — IOPAMIDOL (ISOVUE-370) INJECTION 76%
50.0000 mL | Freq: Once | INTRAVENOUS | Status: AC | PRN
  Administered 2017-08-02: 50 mL via INTRAVENOUS

## 2017-08-02 MED ORDER — SODIUM CHLORIDE 0.9 % IV SOLN
250.0000 mL | INTRAVENOUS | Status: DC
  Administered 2017-08-02: 250 mL via INTRAVENOUS

## 2017-08-02 MED ORDER — ACETAMINOPHEN 650 MG RE SUPP
650.0000 mg | RECTAL | Status: DC | PRN
Start: 2017-08-02 — End: 2017-08-05

## 2017-08-02 MED ORDER — LEVOTHYROXINE SODIUM 75 MCG PO TABS
75.0000 ug | ORAL_TABLET | Freq: Every day | ORAL | Status: DC
  Administered 2017-08-03 – 2017-08-05 (×3): 75 ug via ORAL
  Filled 2017-08-02 (×3): qty 1

## 2017-08-02 MED ORDER — TURMERIC 500 MG PO TABS: 500.0000 mg | ORAL_TABLET | Freq: Every day | ORAL | Status: DC

## 2017-08-02 MED ORDER — ALUM & MAG HYDROXIDE-SIMETH 200-200-20 MG/5ML PO SUSP: 30.0000 mL | Freq: Four times a day (QID) | ORAL | Status: DC | PRN

## 2017-08-02 MED ORDER — POTASSIUM CHLORIDE CRYS ER 10 MEQ PO TBCR: 10.0000 meq | EXTENDED_RELEASE_TABLET | Freq: Every day | ORAL | Status: DC

## 2017-08-02 MED ORDER — CHLORTHALIDONE 25 MG PO TABS
25.0000 mg | ORAL_TABLET | Freq: Every day | ORAL | Status: DC
  Administered 2017-08-02 – 2017-08-05 (×4): 25 mg via ORAL
  Filled 2017-08-02 (×4): qty 1

## 2017-08-02 MED ORDER — TIZANIDINE HCL 4 MG PO TABS
4.0000 mg | ORAL_TABLET | Freq: Four times a day (QID) | ORAL | Status: DC | PRN
  Administered 2017-08-03 – 2017-08-05 (×4): 4 mg via ORAL
  Filled 2017-08-02 (×5): qty 1

## 2017-08-02 MED ORDER — PHENOL 1.4 % MT LIQD: 1.0000 | OROMUCOSAL | Status: DC | PRN

## 2017-08-02 MED ORDER — VITAMIN D 1000 UNITS PO TABS
2000.0000 [IU] | ORAL_TABLET | Freq: Every day | ORAL | Status: DC
  Administered 2017-08-02 – 2017-08-05 (×4): 2000 [IU] via ORAL
  Filled 2017-08-02 (×4): qty 2

## 2017-08-02 MED ORDER — FLUTICASONE FUROATE-VILANTEROL 200-25 MCG/INH IN AEPB
1.0000 | INHALATION_SPRAY | Freq: Every day | RESPIRATORY_TRACT | Status: DC
  Administered 2017-08-02 – 2017-08-05 (×4): 1 via RESPIRATORY_TRACT
  Filled 2017-08-02: qty 28

## 2017-08-02 MED ORDER — SODIUM CHLORIDE 0.9% FLUSH
3.0000 mL | Freq: Two times a day (BID) | INTRAVENOUS | Status: DC
  Administered 2017-08-02 – 2017-08-03 (×3): 3 mL via INTRAVENOUS

## 2017-08-02 MED ORDER — OXYCODONE HCL 5 MG PO TABS
10.0000 mg | ORAL_TABLET | ORAL | Status: DC | PRN
  Administered 2017-08-02 – 2017-08-05 (×12): 10 mg via ORAL
  Filled 2017-08-02 (×13): qty 2

## 2017-08-02 MED ORDER — CLONIDINE HCL 0.3 MG/24HR TD PTWK
0.3000 mg | MEDICATED_PATCH | TRANSDERMAL | Status: DC
  Administered 2017-08-04: 0.3 mg via TRANSDERMAL
  Filled 2017-08-02: qty 1

## 2017-08-02 MED ORDER — ONDANSETRON HCL 4 MG PO TABS: 4.0000 mg | ORAL_TABLET | Freq: Four times a day (QID) | ORAL | Status: DC | PRN

## 2017-08-02 MED ORDER — FENTANYL CITRATE (PF) 100 MCG/2ML IJ SOLN
50.0000 ug | Freq: Once | INTRAMUSCULAR | Status: AC
  Administered 2017-08-02: 50 ug via INTRAVENOUS
  Filled 2017-08-02: qty 2

## 2017-08-02 MED ORDER — GLYCOPYRROLATE 25 MCG/ML IN SOLN: 1.0000 | Freq: Two times a day (BID) | RESPIRATORY_TRACT | Status: DC

## 2017-08-02 MED ORDER — ACETAMINOPHEN 325 MG PO TABS
650.0000 mg | ORAL_TABLET | ORAL | Status: DC | PRN
  Administered 2017-08-02 – 2017-08-05 (×3): 650 mg via ORAL
  Filled 2017-08-02 (×3): qty 2

## 2017-08-02 MED ORDER — PANTOPRAZOLE SODIUM 40 MG IV SOLR
40.0000 mg | Freq: Every day | INTRAVENOUS | Status: DC
  Administered 2017-08-02 – 2017-08-03 (×2): 40 mg via INTRAVENOUS
  Filled 2017-08-02 (×2): qty 40

## 2017-08-02 MED ORDER — ONDANSETRON HCL 4 MG/2ML IJ SOLN: 4.0000 mg | Freq: Four times a day (QID) | INTRAMUSCULAR | Status: DC | PRN

## 2017-08-02 MED ORDER — CYCLOBENZAPRINE HCL 10 MG PO TABS
10.0000 mg | ORAL_TABLET | Freq: Three times a day (TID) | ORAL | Status: DC | PRN
  Administered 2017-08-02 – 2017-08-05 (×6): 10 mg via ORAL
  Filled 2017-08-02 (×6): qty 1

## 2017-08-02 MED ORDER — VITAMIN C 500 MG PO TABS
1000.0000 mg | ORAL_TABLET | Freq: Every day | ORAL | Status: DC
  Administered 2017-08-02 – 2017-08-05 (×4): 1000 mg via ORAL
  Filled 2017-08-02 (×4): qty 2

## 2017-08-02 MED ORDER — MENTHOL 3 MG MT LOZG: 1.0000 | LOZENGE | OROMUCOSAL | Status: DC | PRN

## 2017-08-02 MED ORDER — HYDROMORPHONE HCL 1 MG/ML IJ SOLN
0.5000 mg | INTRAMUSCULAR | Status: DC | PRN
  Administered 2017-08-03: 0.5 mg via INTRAVENOUS
  Filled 2017-08-02: qty 0.5

## 2017-08-02 MED ORDER — ROSUVASTATIN CALCIUM 5 MG PO TABS
10.0000 mg | ORAL_TABLET | Freq: Every day | ORAL | Status: DC
  Administered 2017-08-02 – 2017-08-05 (×4): 10 mg via ORAL
  Filled 2017-08-02: qty 2
  Filled 2017-08-02: qty 1
  Filled 2017-08-02 (×2): qty 2

## 2017-08-02 MED ORDER — ALBUTEROL SULFATE (2.5 MG/3ML) 0.083% IN NEBU: 0.5000 mg | INHALATION_SOLUTION | Freq: Four times a day (QID) | RESPIRATORY_TRACT | Status: DC | PRN

## 2017-08-02 NOTE — Telephone Encounter (Signed)
LOV 07/11/2017  Pt is currently in the ED as of 08/01/2017. Pt is under observation. Rx denied until pt is released.

## 2017-08-02 NOTE — ED Notes (Signed)
Spoke with MD regarding to admission orders, per [place] awaiting neurosurgery for admission [facility] had declined to admit earlier in night. Pt requests pain medication MD informed will review pt's chart.

## 2017-08-02 NOTE — ED Notes (Signed)
RN spoke with Dr. Annamaria Boots office to order pts home meds

## 2017-08-03 ENCOUNTER — Observation Stay (HOSPITAL_COMMUNITY): Payer: Medicare Other

## 2017-08-03 ENCOUNTER — Ambulatory Visit: Payer: Medicare Other | Admitting: Endocrinology

## 2017-08-03 DIAGNOSIS — K219 Gastro-esophageal reflux disease without esophagitis: Secondary | ICD-10-CM | POA: Diagnosis present

## 2017-08-03 DIAGNOSIS — Z79890 Hormone replacement therapy (postmenopausal): Secondary | ICD-10-CM | POA: Diagnosis not present

## 2017-08-03 DIAGNOSIS — Z86010 Personal history of colonic polyps: Secondary | ICD-10-CM | POA: Diagnosis not present

## 2017-08-03 DIAGNOSIS — Z7982 Long term (current) use of aspirin: Secondary | ICD-10-CM | POA: Diagnosis not present

## 2017-08-03 DIAGNOSIS — I10 Essential (primary) hypertension: Secondary | ICD-10-CM | POA: Diagnosis present

## 2017-08-03 DIAGNOSIS — Z888 Allergy status to other drugs, medicaments and biological substances status: Secondary | ICD-10-CM | POA: Diagnosis not present

## 2017-08-03 DIAGNOSIS — Y92410 Unspecified street and highway as the place of occurrence of the external cause: Secondary | ICD-10-CM | POA: Diagnosis not present

## 2017-08-03 DIAGNOSIS — Z981 Arthrodesis status: Secondary | ICD-10-CM | POA: Diagnosis not present

## 2017-08-03 DIAGNOSIS — J449 Chronic obstructive pulmonary disease, unspecified: Secondary | ICD-10-CM | POA: Diagnosis present

## 2017-08-03 DIAGNOSIS — S12501A Unspecified nondisplaced fracture of sixth cervical vertebra, initial encounter for closed fracture: Secondary | ICD-10-CM | POA: Diagnosis not present

## 2017-08-03 DIAGNOSIS — Z8249 Family history of ischemic heart disease and other diseases of the circulatory system: Secondary | ICD-10-CM | POA: Diagnosis not present

## 2017-08-03 DIAGNOSIS — N400 Enlarged prostate without lower urinary tract symptoms: Secondary | ICD-10-CM | POA: Diagnosis present

## 2017-08-03 DIAGNOSIS — M1611 Unilateral primary osteoarthritis, right hip: Secondary | ICD-10-CM | POA: Diagnosis present

## 2017-08-03 DIAGNOSIS — Z809 Family history of malignant neoplasm, unspecified: Secondary | ICD-10-CM | POA: Diagnosis not present

## 2017-08-03 DIAGNOSIS — Z833 Family history of diabetes mellitus: Secondary | ICD-10-CM | POA: Diagnosis not present

## 2017-08-03 DIAGNOSIS — S12500A Unspecified displaced fracture of sixth cervical vertebra, initial encounter for closed fracture: Secondary | ICD-10-CM | POA: Diagnosis not present

## 2017-08-03 DIAGNOSIS — Z87891 Personal history of nicotine dependence: Secondary | ICD-10-CM | POA: Diagnosis not present

## 2017-08-03 DIAGNOSIS — E785 Hyperlipidemia, unspecified: Secondary | ICD-10-CM | POA: Diagnosis present

## 2017-08-03 NOTE — Progress Notes (Signed)
NEUROSURGERY PROGRESS NOTE  Doing well. Complains of appropriate neck soreness. No arm pain No numbness, tingling or weakness  Temp:  [97.7 F (36.5 C)-98.9 F (37.2 C)] 98.4 F (36.9 C) (04/10 1156) Pulse Rate:  [80-96] 90 (04/10 1156) Resp:  [16-18] 18 (04/10 1156) BP: (134-199)/(91-115) 134/95 (04/10 1156) SpO2:  [95 %-100 %] 100 % (04/10 1156)  Plan:  Ordered lateral C spine to assess fracture  Eleonore Chiquito, NP 08/03/2017 1:06 PM
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
## 2017-08-03 NOTE — Progress Notes (Signed)
PT Cancellation Note  Patient Details Name: Curtis Vance MRN: [id number] DOB: November 27, 1945   Cancelled Treatment:    Reason Eval/Treat Not Completed: (P) Other (comment)(Pt just got pain medication and is requesting nap. ) PT will follow back this afternoon as able.  Shia Delaine B. Migdalia Dk PT, DPT Acute Rehabilitation  [phone] Pager [phone]     [place] 08/03/2017, 1:13 PM
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
## 2017-08-03 NOTE — Progress Notes (Signed)
OT Cancellation Note  Patient Details Name: Curtis Vance MRN: [id number] DOB: 1946/04/26   Cancelled Treatment:    Reason Eval/Treat Not Completed: Fatigue/lethargy limiting ability to participate. Upon arrival pt resting and states that he just received pain meds and has not been able to rest well and would like to take a nap and requested therapy return later this afternoon  [name] 08/03/2017, 12:58 PM

## 2017-08-03 NOTE — Plan of Care (Signed)

## 2017-08-04 ENCOUNTER — Inpatient Hospital Stay (HOSPITAL_COMMUNITY): Payer: Medicare Other

## 2017-08-04 MED ORDER — PANTOPRAZOLE SODIUM 40 MG PO TBEC
40.0000 mg | DELAYED_RELEASE_TABLET | Freq: Every day | ORAL | Status: DC
  Administered 2017-08-04: 40 mg via ORAL
  Filled 2017-08-04: qty 1

## 2017-08-04 MED ORDER — BISACODYL 5 MG PO TBEC
5.0000 mg | DELAYED_RELEASE_TABLET | Freq: Every day | ORAL | Status: DC | PRN
  Administered 2017-08-05: 5 mg via ORAL
  Filled 2017-08-04: qty 1
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
## 2017-08-04 NOTE — Progress Notes (Signed)
OT wanted clarification on pt's neck collar orders and MD/NP called and notified. NP returns call back to inform RN; pt is to have neck collar on at all times and can change to [facility] J collar for showers but pt should remain supine when changing collars for bath. NP to put in clarification order. Will continue to closely monitor. Delia Heady RN
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
## 2017-08-05 MED ORDER — BISACODYL 10 MG RE SUPP
10.0000 mg | Freq: Every day | RECTAL | Status: DC | PRN
  Filled 2017-08-05: qty 1

## 2017-08-05 MED ORDER — SENNOSIDES-DOCUSATE SODIUM 8.6-50 MG PO TABS: 2.0000 | ORAL_TABLET | Freq: Every evening | ORAL | Status: DC | PRN

## 2017-08-05 MED ORDER — OXYCODONE-ACETAMINOPHEN 7.5-325 MG PO TABS: 1.0000 | ORAL_TABLET | ORAL | 0 refills | Status: DC | PRN

## 2017-08-05 MED ORDER — METHOCARBAMOL 750 MG PO TABS: 750.0000 mg | ORAL_TABLET | Freq: Three times a day (TID) | ORAL | 1 refills | Status: DC | PRN
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
## 2017-08-05 NOTE — Progress Notes (Addendum)
Neurosurgery Progress Note  No issues overnight.  Complains of appropriate neck soreness Denies focal deficits Right elbow pain has resolved since removal of IV  EXAM:  BP (!) 136/91 (BP Location: Left Arm)   Pulse 92   Temp 98.2 F (36.8 C) (Oral)   Resp 16   Ht 5\' 8"  (1.727 m)   Wt 75.8 kg (167 lb)   SpO2 99%   BMI 25.39 kg/m   Awake, alert, oriented  Speech fluent, appropriate  CN grossly intact  5/5 BUE/BLE   PLAN Stable this am Right elbow pain completely resolved with removal of IV. No need for U/S. Cleared for d/c

## 2017-08-05 NOTE — Care Management Important Message (Signed)
Important Message  Patient Details  Name: Curtis Vance MRN: [id number] Date of Birth: 11/17/1943   Medicare Important Message Given:  Yes    Orbie Pyo 08/05/2017, 1:59 PM

## 2017-08-05 NOTE — Progress Notes (Signed)
Pt discharge education and instructions completed with pt and family at bedside; all voices understanding and denies any questions. Pt IV removed; neck collar remains on and aligned. Pt discharge home with family to transport him home. Pt handed his prescriptions for Robaxin and percocet. Pt transported off unit via wheelchair with belongings and family at the side. Delia Heady RN

## 2017-08-05 NOTE — Progress Notes (Signed)
CM talked patient and spouse about [place] choices; patient does not want any HHC at this time; CM informed patient that if he changed his mind his PCP can make the arrangements from the office; Aneta Mins [phone]

## 2017-08-05 NOTE — Progress Notes (Signed)
Orthopedic Tech Progress Note Patient Details:  Curtis Vance 11/17/1943 [id number]  Ortho Devices Type of Ortho Device: [place] cervical collar Ortho Device/Splint Location: Provided [place]/shower collar as requested by Dr. and floor nurse.  Given to pt in room 3W21       [name] 08/05/2017, 7:40 PM

## 2017-08-05 NOTE — Progress Notes (Signed)
Pt needed this [place] J and on on call PA [place] notified and received verbal order for it. Ortho tech notified and collar delivered to pt at bedside. Delia Heady RN
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
## 2017-08-11 ENCOUNTER — Ambulatory Visit: Payer: Medicare Other | Admitting: Family Medicine

## 2017-08-15 ENCOUNTER — Other Ambulatory Visit: Payer: Self-pay
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
## 2017-08-16 MED ORDER — BUDESONIDE-FORMOTEROL FUMARATE 160-4.5 MCG/ACT IN AERO: 2.0000 | INHALATION_SPRAY | Freq: Two times a day (BID) | RESPIRATORY_TRACT | 5 refills | Status: DC | PRN

## 2017-08-17 ENCOUNTER — Ambulatory Visit (INDEPENDENT_AMBULATORY_CARE_PROVIDER_SITE_OTHER): Payer: Medicare Other | Admitting: Internal Medicine

## 2017-08-17 ENCOUNTER — Encounter: Payer: Self-pay | Admitting: Internal Medicine

## 2017-08-17 VITALS — BP 138/88 | HR 88 | Temp 97.8°F | Resp 18 | Wt 169.0 lb

## 2017-08-17 DIAGNOSIS — G8929 Other chronic pain: Secondary | ICD-10-CM | POA: Diagnosis not present

## 2017-08-17 DIAGNOSIS — T402X5A Adverse effect of other opioids, initial encounter: Secondary | ICD-10-CM

## 2017-08-17 DIAGNOSIS — S12500D Unspecified displaced fracture of sixth cervical vertebra, subsequent encounter for fracture with routine healing: Secondary | ICD-10-CM

## 2017-08-17 DIAGNOSIS — M25511 Pain in right shoulder: Secondary | ICD-10-CM | POA: Diagnosis not present

## 2017-08-17 DIAGNOSIS — K5903 Drug induced constipation: Secondary | ICD-10-CM

## 2017-08-17 MED ORDER — OXYCODONE-ACETAMINOPHEN 7.5-325 MG PO TABS: 1.0000 | ORAL_TABLET | ORAL | 0 refills | Status: DC | PRN

## 2017-08-17 MED ORDER — LUBIPROSTONE 24 MCG PO CAPS: 24.0000 ug | ORAL_CAPSULE | Freq: Two times a day (BID) | ORAL | 1 refills | Status: DC
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
## 2017-08-17 NOTE — Patient Instructions (Signed)

## 2017-08-18 ENCOUNTER — Other Ambulatory Visit: Payer: Self-pay | Admitting: Neurosurgery

## 2017-08-18 ENCOUNTER — Ambulatory Visit
Admission: RE | Admit: 2017-08-18 | Discharge: 2017-08-18 | Disposition: A | Discharge status: Home / Self Care | Payer: Medicare Other | Source: Ambulatory Visit | Attending: Neurosurgery | Admitting: Neurosurgery

## 2017-08-18 DIAGNOSIS — S129XXA Fracture of neck, unspecified, initial encounter: Secondary | ICD-10-CM | POA: Diagnosis not present

## 2017-08-18 DIAGNOSIS — M542 Cervicalgia: Secondary | ICD-10-CM | POA: Diagnosis not present

## 2017-08-18 DIAGNOSIS — S12500A Unspecified displaced fracture of sixth cervical vertebra, initial encounter for closed fracture: Secondary | ICD-10-CM | POA: Diagnosis not present

## 2017-08-19 ENCOUNTER — Encounter (HOSPITAL_COMMUNITY): Payer: Self-pay | Admitting: *Deleted

## 2017-08-19 ENCOUNTER — Other Ambulatory Visit: Payer: Self-pay

## 2017-08-19 NOTE — Progress Notes (Signed)
Spoke with pt's wife, Curtis Vance for pre-op call. She states pt does not have a cardiac history. States he is not diabetic.Instructed Glenda to have pt stop his herbal medications and NSAIDS as of today.

## 2017-08-22 ENCOUNTER — Inpatient Hospital Stay (HOSPITAL_COMMUNITY): Payer: Medicare Other | Admitting: Anesthesiology

## 2017-08-22 ENCOUNTER — Inpatient Hospital Stay (HOSPITAL_COMMUNITY)
Admission: RE | Disposition: A | Discharge status: Home / Self Care | Payer: Self-pay | Source: Home / Self Care | Attending: Neurosurgery

## 2017-08-22 ENCOUNTER — Encounter (HOSPITAL_COMMUNITY): Payer: Self-pay | Admitting: *Deleted

## 2017-08-22 ENCOUNTER — Inpatient Hospital Stay (HOSPITAL_COMMUNITY): Payer: Medicare Other

## 2017-08-22 ENCOUNTER — Inpatient Hospital Stay (HOSPITAL_COMMUNITY)
Admission: RE | Admit: 2017-08-22 | Discharge: 2017-09-04 | DRG: 472 | Disposition: A | Discharge status: Home / Self Care | Payer: Medicare Other | Attending: Neurosurgery | Admitting: Neurosurgery

## 2017-08-22 DIAGNOSIS — R402143 Coma scale, eyes open, spontaneous, at hospital admission: Secondary | ICD-10-CM | POA: Diagnosis present

## 2017-08-22 DIAGNOSIS — Z791 Long term (current) use of non-steroidal anti-inflammatories (NSAID): Secondary | ICD-10-CM

## 2017-08-22 DIAGNOSIS — M4803 Spinal stenosis, cervicothoracic region: Secondary | ICD-10-CM | POA: Diagnosis not present

## 2017-08-22 DIAGNOSIS — E0500 Thyrotoxicosis with diffuse goiter without thyrotoxic crisis or storm: Secondary | ICD-10-CM

## 2017-08-22 DIAGNOSIS — Z87891 Personal history of nicotine dependence: Secondary | ICD-10-CM | POA: Diagnosis not present

## 2017-08-22 DIAGNOSIS — I10 Essential (primary) hypertension: Secondary | ICD-10-CM | POA: Diagnosis not present

## 2017-08-22 DIAGNOSIS — G9520 Unspecified cord compression: Secondary | ICD-10-CM | POA: Diagnosis not present

## 2017-08-22 DIAGNOSIS — R531 Weakness: Secondary | ICD-10-CM | POA: Diagnosis present

## 2017-08-22 DIAGNOSIS — G959 Disease of spinal cord, unspecified: Secondary | ICD-10-CM | POA: Diagnosis present

## 2017-08-22 DIAGNOSIS — M4323 Fusion of spine, cervicothoracic region: Secondary | ICD-10-CM | POA: Diagnosis not present

## 2017-08-22 DIAGNOSIS — K5900 Constipation, unspecified: Secondary | ICD-10-CM

## 2017-08-22 DIAGNOSIS — E039 Hypothyroidism, unspecified: Secondary | ICD-10-CM | POA: Diagnosis present

## 2017-08-22 DIAGNOSIS — E7800 Pure hypercholesterolemia, unspecified: Secondary | ICD-10-CM | POA: Diagnosis not present

## 2017-08-22 DIAGNOSIS — M25511 Pain in right shoulder: Secondary | ICD-10-CM

## 2017-08-22 DIAGNOSIS — G992 Myelopathy in diseases classified elsewhere: Secondary | ICD-10-CM | POA: Diagnosis not present

## 2017-08-22 DIAGNOSIS — T8131XA Disruption of external operation (surgical) wound, not elsewhere classified, initial encounter: Secondary | ICD-10-CM | POA: Diagnosis not present

## 2017-08-22 DIAGNOSIS — T8130XA Disruption of wound, unspecified, initial encounter: Secondary | ICD-10-CM | POA: Diagnosis not present

## 2017-08-22 DIAGNOSIS — R402363 Coma scale, best motor response, obeys commands, at hospital admission: Secondary | ICD-10-CM | POA: Diagnosis not present

## 2017-08-22 DIAGNOSIS — Z419 Encounter for procedure for purposes other than remedying health state, unspecified: Secondary | ICD-10-CM

## 2017-08-22 DIAGNOSIS — R05 Cough: Secondary | ICD-10-CM

## 2017-08-22 DIAGNOSIS — M5412 Radiculopathy, cervical region: Secondary | ICD-10-CM | POA: Diagnosis not present

## 2017-08-22 DIAGNOSIS — R202 Paresthesia of skin: Secondary | ICD-10-CM | POA: Diagnosis present

## 2017-08-22 DIAGNOSIS — M542 Cervicalgia: Secondary | ICD-10-CM

## 2017-08-22 DIAGNOSIS — R059 Cough, unspecified: Secondary | ICD-10-CM

## 2017-08-22 DIAGNOSIS — K567 Ileus, unspecified: Secondary | ICD-10-CM | POA: Diagnosis not present

## 2017-08-22 DIAGNOSIS — S12500D Unspecified displaced fracture of sixth cervical vertebra, subsequent encounter for fracture with routine healing: Secondary | ICD-10-CM

## 2017-08-22 DIAGNOSIS — Z888 Allergy status to other drugs, medicaments and biological substances status: Secondary | ICD-10-CM | POA: Diagnosis not present

## 2017-08-22 DIAGNOSIS — M4322 Fusion of spine, cervical region: Secondary | ICD-10-CM | POA: Diagnosis not present

## 2017-08-22 DIAGNOSIS — Z9109 Other allergy status, other than to drugs and biological substances: Secondary | ICD-10-CM

## 2017-08-22 DIAGNOSIS — R200 Anesthesia of skin: Secondary | ICD-10-CM | POA: Diagnosis not present

## 2017-08-22 DIAGNOSIS — J449 Chronic obstructive pulmonary disease, unspecified: Secondary | ICD-10-CM | POA: Diagnosis present

## 2017-08-22 DIAGNOSIS — Z79899 Other long term (current) drug therapy: Secondary | ICD-10-CM

## 2017-08-22 DIAGNOSIS — E785 Hyperlipidemia, unspecified: Secondary | ICD-10-CM | POA: Diagnosis not present

## 2017-08-22 DIAGNOSIS — Z7982 Long term (current) use of aspirin: Secondary | ICD-10-CM

## 2017-08-22 DIAGNOSIS — M4802 Spinal stenosis, cervical region: Principal | ICD-10-CM | POA: Diagnosis present

## 2017-08-22 DIAGNOSIS — K5904 Chronic idiopathic constipation: Secondary | ICD-10-CM | POA: Diagnosis not present

## 2017-08-22 DIAGNOSIS — K5641 Fecal impaction: Secondary | ICD-10-CM | POA: Diagnosis not present

## 2017-08-22 DIAGNOSIS — Z7951 Long term (current) use of inhaled steroids: Secondary | ICD-10-CM

## 2017-08-22 DIAGNOSIS — S12500A Unspecified displaced fracture of sixth cervical vertebra, initial encounter for closed fracture: Secondary | ICD-10-CM | POA: Diagnosis not present

## 2017-08-22 DIAGNOSIS — Z79890 Hormone replacement therapy (postmenopausal): Secondary | ICD-10-CM

## 2017-08-22 DIAGNOSIS — M79603 Pain in arm, unspecified: Secondary | ICD-10-CM | POA: Diagnosis not present

## 2017-08-22 DIAGNOSIS — T40605A Adverse effect of unspecified narcotics, initial encounter: Secondary | ICD-10-CM | POA: Diagnosis not present

## 2017-08-22 DIAGNOSIS — K219 Gastro-esophageal reflux disease without esophagitis: Secondary | ICD-10-CM | POA: Diagnosis not present

## 2017-08-22 DIAGNOSIS — G8929 Other chronic pain: Secondary | ICD-10-CM

## 2017-08-22 DIAGNOSIS — K581 Irritable bowel syndrome with constipation: Secondary | ICD-10-CM | POA: Diagnosis not present

## 2017-08-22 DIAGNOSIS — R402253 Coma scale, best verbal response, oriented, at hospital admission: Secondary | ICD-10-CM | POA: Diagnosis present

## 2017-08-22 DIAGNOSIS — K5903 Drug induced constipation: Secondary | ICD-10-CM | POA: Diagnosis not present

## 2017-08-22 DIAGNOSIS — M479 Spondylosis, unspecified: Secondary | ICD-10-CM | POA: Diagnosis present

## 2017-08-22 DIAGNOSIS — D509 Iron deficiency anemia, unspecified: Secondary | ICD-10-CM | POA: Diagnosis not present

## 2017-08-22 DIAGNOSIS — T402X5A Adverse effect of other opioids, initial encounter: Secondary | ICD-10-CM

## 2017-08-22 DIAGNOSIS — D649 Anemia, unspecified: Secondary | ICD-10-CM | POA: Diagnosis present

## 2017-08-22 DIAGNOSIS — Z86010 Personal history of colonic polyps: Secondary | ICD-10-CM

## 2017-08-22 HISTORY — DX: Constipation, unspecified: K59.00

## 2017-08-22 HISTORY — PX: POSTERIOR CERVICAL FUSION/FORAMINOTOMY: SHX5038

## 2017-08-22 LAB — TYPE AND SCREEN
ABO/RH(D): A POS
ANTIBODY SCREEN: NEGATIVE

## 2017-08-22 LAB — ABO/RH: ABO/RH(D): A POS

## 2017-08-22 LAB — CBC
HEMATOCRIT: 38.5 % — AB (ref 39.0–52.0)
Hemoglobin: 12.9 g/dL — ABNORMAL LOW (ref 13.0–17.0)
MCH: 30.3 pg (ref 26.0–34.0)
MCHC: 33.5 g/dL (ref 30.0–36.0)
MCV: 90.4 fL (ref 78.0–100.0)
PLATELETS: 449 10*3/uL — AB (ref 150–400)
RBC: 4.26 MIL/uL (ref 4.22–5.81)
RDW: 14.3 % (ref 11.5–15.5)
WBC: 11.1 10*3/uL — AB (ref 4.0–10.5)

## 2017-08-22 LAB — BASIC METABOLIC PANEL
Anion gap: 11 (ref 5–15)
BUN: 15 mg/dL (ref 6–20)
CO2: 27 mmol/L (ref 22–32)
Calcium: 9.5 mg/dL (ref 8.9–10.3)
Chloride: 100 mmol/L — ABNORMAL LOW (ref 101–111)
Creatinine, Ser: 1.02 mg/dL (ref 0.61–1.24)
GFR calc Af Amer: >60 mL/min (ref >60)
Glucose, Bld: 90 mg/dL (ref 65–99)
POTASSIUM: 3.6 mmol/L (ref 3.5–5.1)
SODIUM: 138 mmol/L (ref 135–145)

## 2017-08-22 SURGERY — POSTERIOR CERVICAL FUSION/FORAMINOTOMY LEVEL 3
Anesthesia: General

## 2017-08-22 MED ORDER — SODIUM CHLORIDE 0.9% FLUSH
3.0000 mL | Freq: Two times a day (BID) | INTRAVENOUS | Status: DC
  Administered 2017-08-23 – 2017-08-25 (×4): 3 mL via INTRAVENOUS

## 2017-08-22 MED ORDER — THROMBIN 5000 UNITS EX SOLR
CUTANEOUS | Status: AC
  Filled 2017-08-22: qty 5000

## 2017-08-22 MED ORDER — ONDANSETRON HCL 4 MG/2ML IJ SOLN
4.0000 mg | Freq: Four times a day (QID) | INTRAMUSCULAR | Status: DC | PRN
  Filled 2017-08-22: qty 2

## 2017-08-22 MED ORDER — CYCLOBENZAPRINE HCL 10 MG PO TABS
10.0000 mg | ORAL_TABLET | Freq: Three times a day (TID) | ORAL | Status: DC | PRN
  Administered 2017-08-22 – 2017-08-30 (×4): 10 mg via ORAL
  Filled 2017-08-22 (×2): qty 1

## 2017-08-22 MED ORDER — PROPOFOL 10 MG/ML IV BOLUS
INTRAVENOUS | Status: DC | PRN
  Administered 2017-08-22: 150 mg via INTRAVENOUS
  Administered 2017-08-22: 50 mg via INTRAVENOUS

## 2017-08-22 MED ORDER — GUAIFENESIN ER 600 MG PO TB12
600.0000 mg | ORAL_TABLET | Freq: Every day | ORAL | Status: DC
  Administered 2017-08-23 – 2017-09-04 (×13): 600 mg via ORAL
  Filled 2017-08-22 (×13): qty 1

## 2017-08-22 MED ORDER — PHENYLEPHRINE HCL 10 MG/ML IJ SOLN
INTRAMUSCULAR | Status: DC | PRN
  Administered 2017-08-22: 80 ug via INTRAVENOUS

## 2017-08-22 MED ORDER — MENTHOL 3 MG MT LOZG
1.0000 | LOZENGE | OROMUCOSAL | Status: DC | PRN
  Administered 2017-08-28: 3 mg via ORAL
  Filled 2017-08-22 (×3): qty 9

## 2017-08-22 MED ORDER — 0.9 % SODIUM CHLORIDE (POUR BTL) OPTIME
TOPICAL | Status: DC | PRN
  Administered 2017-08-22 (×2): 1000 mL

## 2017-08-22 MED ORDER — METOCLOPRAMIDE HCL 5 MG/ML IJ SOLN: 10.0000 mg | Freq: Once | INTRAMUSCULAR | Status: DC | PRN

## 2017-08-22 MED ORDER — METHOCARBAMOL 500 MG PO TABS
ORAL_TABLET | ORAL | Status: AC
  Filled 2017-08-22: qty 1

## 2017-08-22 MED ORDER — HYDROMORPHONE HCL 1 MG/ML IJ SOLN: 1.0000 mg | INTRAMUSCULAR | Status: DC | PRN

## 2017-08-22 MED ORDER — LIDOCAINE HCL (CARDIAC) PF 100 MG/5ML IV SOSY
PREFILLED_SYRINGE | INTRAVENOUS | Status: DC | PRN
  Administered 2017-08-22: 100 mg via INTRAVENOUS

## 2017-08-22 MED ORDER — TURMERIC 500 MG PO TABS: 500.0000 mg | ORAL_TABLET | Freq: Every day | ORAL | Status: DC

## 2017-08-22 MED ORDER — DEXTROSE 5 % IV SOLN
INTRAVENOUS | Status: DC | PRN
  Administered 2017-08-22: 50 ug/min via INTRAVENOUS

## 2017-08-22 MED ORDER — SODIUM CHLORIDE 0.9% FLUSH: 3.0000 mL | INTRAVENOUS | Status: DC | PRN

## 2017-08-22 MED ORDER — FENTANYL CITRATE (PF) 100 MCG/2ML IJ SOLN
INTRAMUSCULAR | Status: AC
  Administered 2017-08-22: 50 ug via INTRAVENOUS
  Filled 2017-08-22: qty 2

## 2017-08-22 MED ORDER — VANCOMYCIN HCL 1000 MG IV SOLR
INTRAVENOUS | Status: AC
  Filled 2017-08-22: qty 1000

## 2017-08-22 MED ORDER — MEPERIDINE HCL 50 MG/ML IJ SOLN: 6.2500 mg | INTRAMUSCULAR | Status: DC | PRN

## 2017-08-22 MED ORDER — THROMBIN 20000 UNITS EX SOLR
CUTANEOUS | Status: AC
  Filled 2017-08-22: qty 20000

## 2017-08-22 MED ORDER — OXYCODONE HCL 5 MG PO TABS
10.0000 mg | ORAL_TABLET | ORAL | Status: DC | PRN
  Administered 2017-08-22 – 2017-09-03 (×19): 10 mg via ORAL
  Filled 2017-08-22 (×17): qty 2

## 2017-08-22 MED ORDER — ACETAMINOPHEN 325 MG PO TABS: 650.0000 mg | ORAL_TABLET | ORAL | Status: DC | PRN

## 2017-08-22 MED ORDER — OXYCODONE-ACETAMINOPHEN 7.5-325 MG PO TABS
1.0000 | ORAL_TABLET | ORAL | Status: DC | PRN
  Administered 2017-08-23 – 2017-08-30 (×10): 1 via ORAL
  Filled 2017-08-22 (×10): qty 1

## 2017-08-22 MED ORDER — CEFAZOLIN SODIUM-DEXTROSE 2-4 GM/100ML-% IV SOLN
2.0000 g | Freq: Three times a day (TID) | INTRAVENOUS | Status: AC
  Administered 2017-08-22 – 2017-08-26 (×12): 2 g via INTRAVENOUS
  Filled 2017-08-22 (×12): qty 100

## 2017-08-22 MED ORDER — GLYCOPYRROLATE 25 MCG/ML IN SOLN: 1.0000 | Freq: Two times a day (BID) | RESPIRATORY_TRACT | Status: DC

## 2017-08-22 MED ORDER — BUPIVACAINE LIPOSOME 1.3 % IJ SUSP
20.0000 mL | INTRAMUSCULAR | Status: AC
  Administered 2017-08-22: 20 mL
  Filled 2017-08-22: qty 20

## 2017-08-22 MED ORDER — LIDOCAINE-EPINEPHRINE 1 %-1:100000 IJ SOLN
INTRAMUSCULAR | Status: DC | PRN
  Administered 2017-08-22: 7 mL

## 2017-08-22 MED ORDER — ONDANSETRON HCL 4 MG/2ML IJ SOLN
INTRAMUSCULAR | Status: DC | PRN
  Administered 2017-08-22: 4 mg via INTRAVENOUS

## 2017-08-22 MED ORDER — ARTIFICIAL TEARS OPHTHALMIC OINT
TOPICAL_OINTMENT | OPHTHALMIC | Status: DC | PRN
  Administered 2017-08-22: 1 via OPHTHALMIC

## 2017-08-22 MED ORDER — THROMBIN (RECOMBINANT) 5000 UNITS EX SOLR
CUTANEOUS | Status: DC | PRN
  Administered 2017-08-22: 13:00:00 via TOPICAL

## 2017-08-22 MED ORDER — MOMETASONE FURO-FORMOTEROL FUM 200-5 MCG/ACT IN AERO
2.0000 | INHALATION_SPRAY | Freq: Two times a day (BID) | RESPIRATORY_TRACT | Status: DC
  Administered 2017-08-23 – 2017-09-03 (×13): 2 via RESPIRATORY_TRACT
  Filled 2017-08-22: qty 8.8

## 2017-08-22 MED ORDER — ALBUTEROL SULFATE (2.5 MG/3ML) 0.083% IN NEBU: 2.5000 mg | INHALATION_SOLUTION | Freq: Four times a day (QID) | RESPIRATORY_TRACT | Status: DC | PRN

## 2017-08-22 MED ORDER — DEXAMETHASONE SODIUM PHOSPHATE 10 MG/ML IJ SOLN
10.0000 mg | INTRAMUSCULAR | Status: AC
  Administered 2017-08-22: 10 mg via INTRAVENOUS
  Filled 2017-08-22: qty 1

## 2017-08-22 MED ORDER — SODIUM CHLORIDE 0.9 % IV SOLN
INTRAVENOUS | Status: DC | PRN
  Administered 2017-08-22: 13:00:00

## 2017-08-22 MED ORDER — LUBIPROSTONE 24 MCG PO CAPS
24.0000 ug | ORAL_CAPSULE | Freq: Two times a day (BID) | ORAL | Status: DC
  Administered 2017-08-23 – 2017-09-04 (×25): 24 ug via ORAL
  Filled 2017-08-22 (×27): qty 1

## 2017-08-22 MED ORDER — LORATADINE 10 MG PO TABS
10.0000 mg | ORAL_TABLET | Freq: Every day | ORAL | Status: DC
  Administered 2017-08-23 – 2017-09-04 (×13): 10 mg via ORAL
  Filled 2017-08-22 (×13): qty 1

## 2017-08-22 MED ORDER — PROPOFOL 10 MG/ML IV BOLUS
INTRAVENOUS | Status: AC
  Filled 2017-08-22: qty 20

## 2017-08-22 MED ORDER — CEFAZOLIN SODIUM-DEXTROSE 2-4 GM/100ML-% IV SOLN
2.0000 g | INTRAVENOUS | Status: AC
  Administered 2017-08-22: 2 g via INTRAVENOUS
  Filled 2017-08-22: qty 100

## 2017-08-22 MED ORDER — FENTANYL CITRATE (PF) 100 MCG/2ML IJ SOLN
INTRAMUSCULAR | Status: AC
  Filled 2017-08-22: qty 2

## 2017-08-22 MED ORDER — ASPIRIN EC 81 MG PO TBEC
81.0000 mg | DELAYED_RELEASE_TABLET | Freq: Every day | ORAL | Status: DC
  Administered 2017-08-23 – 2017-09-04 (×12): 81 mg via ORAL
  Filled 2017-08-22 (×13): qty 1

## 2017-08-22 MED ORDER — CHLORTHALIDONE 25 MG PO TABS: 25.0000 mg | ORAL_TABLET | Freq: Every day | ORAL | Status: DC

## 2017-08-22 MED ORDER — CYCLOBENZAPRINE HCL 10 MG PO TABS
ORAL_TABLET | ORAL | Status: AC
  Filled 2017-08-22: qty 1

## 2017-08-22 MED ORDER — VITAMIN C 500 MG PO TABS
1000.0000 mg | ORAL_TABLET | Freq: Every day | ORAL | Status: DC
  Administered 2017-08-23 – 2017-09-04 (×12): 1000 mg via ORAL
  Filled 2017-08-22 (×13): qty 2

## 2017-08-22 MED ORDER — ALUM & MAG HYDROXIDE-SIMETH 200-200-20 MG/5ML PO SUSP
30.0000 mL | Freq: Four times a day (QID) | ORAL | Status: DC | PRN
  Filled 2017-08-22: qty 30

## 2017-08-22 MED ORDER — LIDOCAINE-EPINEPHRINE 1 %-1:100000 IJ SOLN
INTRAMUSCULAR | Status: AC
  Filled 2017-08-22: qty 1

## 2017-08-22 MED ORDER — OXYCODONE HCL 5 MG PO TABS
ORAL_TABLET | ORAL | Status: AC
  Filled 2017-08-22: qty 2

## 2017-08-22 MED ORDER — FENTANYL CITRATE (PF) 250 MCG/5ML IJ SOLN
INTRAMUSCULAR | Status: AC
  Filled 2017-08-22: qty 5

## 2017-08-22 MED ORDER — SUGAMMADEX SODIUM 200 MG/2ML IV SOLN
INTRAVENOUS | Status: DC | PRN
  Administered 2017-08-22: 200 mg via INTRAVENOUS

## 2017-08-22 MED ORDER — BACITRACIN ZINC 500 UNIT/GM EX OINT
TOPICAL_OINTMENT | CUTANEOUS | Status: DC | PRN
  Administered 2017-08-22: 1 via TOPICAL

## 2017-08-22 MED ORDER — BACITRACIN ZINC 500 UNIT/GM EX OINT
TOPICAL_OINTMENT | CUTANEOUS | Status: AC
  Filled 2017-08-22: qty 28.35

## 2017-08-22 MED ORDER — PHENOL 1.4 % MT LIQD
1.0000 | OROMUCOSAL | Status: DC | PRN
  Administered 2017-08-30 – 2017-09-03 (×2): 1 via OROMUCOSAL
  Filled 2017-08-22: qty 177

## 2017-08-22 MED ORDER — LEVOTHYROXINE SODIUM 75 MCG PO TABS
75.0000 ug | ORAL_TABLET | Freq: Every day | ORAL | Status: DC
  Administered 2017-08-23 – 2017-09-04 (×13): 75 ug via ORAL
  Filled 2017-08-22 (×13): qty 1

## 2017-08-22 MED ORDER — ROCURONIUM BROMIDE 100 MG/10ML IV SOLN
INTRAVENOUS | Status: DC | PRN
  Administered 2017-08-22 (×2): 20 mg via INTRAVENOUS
  Administered 2017-08-22: 50 mg via INTRAVENOUS
  Administered 2017-08-22: 10 mg via INTRAVENOUS

## 2017-08-22 MED ORDER — FENTANYL CITRATE (PF) 100 MCG/2ML IJ SOLN
INTRAMUSCULAR | Status: DC | PRN
  Administered 2017-08-22 (×2): 50 ug via INTRAVENOUS
  Administered 2017-08-22: 150 ug via INTRAVENOUS

## 2017-08-22 MED ORDER — CHLORHEXIDINE GLUCONATE CLOTH 2 % EX PADS: 6.0000 | MEDICATED_PAD | Freq: Once | CUTANEOUS | Status: DC

## 2017-08-22 MED ORDER — PHENYLEPHRINE HCL 10 MG/ML IJ SOLN: INTRAVENOUS | Status: DC | PRN

## 2017-08-22 MED ORDER — ROSUVASTATIN CALCIUM 10 MG PO TABS
10.0000 mg | ORAL_TABLET | Freq: Every day | ORAL | Status: DC
  Administered 2017-08-23 – 2017-09-04 (×12): 10 mg via ORAL
  Filled 2017-08-22 (×12): qty 1

## 2017-08-22 MED ORDER — ACETAMINOPHEN 650 MG RE SUPP: 650.0000 mg | RECTAL | Status: DC | PRN

## 2017-08-22 MED ORDER — ONDANSETRON HCL 4 MG PO TABS: 4.0000 mg | ORAL_TABLET | Freq: Four times a day (QID) | ORAL | Status: DC | PRN

## 2017-08-22 MED ORDER — LACTATED RINGERS IV SOLN
INTRAVENOUS | Status: DC
  Administered 2017-08-22: 11:00:00 via INTRAVENOUS

## 2017-08-22 MED ORDER — METHOCARBAMOL 500 MG PO TABS
750.0000 mg | ORAL_TABLET | Freq: Three times a day (TID) | ORAL | Status: DC | PRN
  Administered 2017-08-23 – 2017-09-01 (×7): 750 mg via ORAL
  Filled 2017-08-22 (×7): qty 2

## 2017-08-22 MED ORDER — ALBUTEROL SULFATE HFA 108 (90 BASE) MCG/ACT IN AERS: 2.0000 | INHALATION_SPRAY | Freq: Four times a day (QID) | RESPIRATORY_TRACT | Status: DC | PRN

## 2017-08-22 MED ORDER — FAMOTIDINE IN NACL 20-0.9 MG/50ML-% IV SOLN
20.0000 mg | Freq: Two times a day (BID) | INTRAVENOUS | Status: DC
  Administered 2017-08-22 – 2017-08-23 (×2): 20 mg via INTRAVENOUS
  Filled 2017-08-22 (×2): qty 50

## 2017-08-22 MED ORDER — DEXAMETHASONE SODIUM PHOSPHATE 10 MG/ML IJ SOLN
INTRAMUSCULAR | Status: AC
  Administered 2017-08-22: 10 mg via INTRAVENOUS
  Filled 2017-08-22: qty 1

## 2017-08-22 MED ORDER — SODIUM CHLORIDE 0.9 % IV SOLN
250.0000 mL | INTRAVENOUS | Status: DC
  Administered 2017-08-22 – 2017-08-25 (×2): 250 mL via INTRAVENOUS

## 2017-08-22 MED ORDER — FENTANYL CITRATE (PF) 100 MCG/2ML IJ SOLN
25.0000 ug | INTRAMUSCULAR | Status: DC | PRN
  Administered 2017-08-22 (×3): 50 ug via INTRAVENOUS

## 2017-08-22 MED ORDER — BUPIVACAINE HCL (PF) 0.25 % IJ SOLN
INTRAMUSCULAR | Status: AC
  Filled 2017-08-22: qty 30

## 2017-08-22 MED ORDER — CLONIDINE HCL 0.3 MG/24HR TD PTWK
0.3000 mg | MEDICATED_PATCH | TRANSDERMAL | Status: DC
  Administered 2017-08-28 – 2017-09-04 (×2): 0.3 mg via TRANSDERMAL
  Filled 2017-08-22 (×2): qty 1

## 2017-08-22 MED ORDER — LACTATED RINGERS IV SOLN
INTRAVENOUS | Status: DC | PRN
  Administered 2017-08-22 (×2): via INTRAVENOUS

## 2017-08-22 MED ORDER — VANCOMYCIN HCL 1000 MG IV SOLR
INTRAVENOUS | Status: DC | PRN
  Administered 2017-08-22: 1000 mg

## 2017-08-22 MED ORDER — SURGIFOAM 100 EX MISC
CUTANEOUS | Status: DC | PRN
  Administered 2017-08-22: 13:00:00 via TOPICAL

## 2017-08-22 MED ORDER — DEXAMETHASONE SODIUM PHOSPHATE 10 MG/ML IJ SOLN
10.0000 mg | Freq: Four times a day (QID) | INTRAMUSCULAR | Status: DC
  Administered 2017-08-22 – 2017-08-29 (×27): 10 mg via INTRAVENOUS
  Filled 2017-08-22 (×26): qty 1

## 2017-08-22 MED ORDER — VITAMIN D3 25 MCG (1000 UNIT) PO TABS
2000.0000 [IU] | ORAL_TABLET | Freq: Every day | ORAL | Status: DC
  Administered 2017-08-23 – 2017-08-24 (×2): 2000 [IU] via ORAL
  Filled 2017-08-22 (×5): qty 2

## 2017-08-22 SURGICAL SUPPLY — 80 items
ADH SKN CLS APL DERMABOND .7 (GAUZE/BANDAGES/DRESSINGS) ×1
APL SKNCLS STERI-STRIP NONHPOA (GAUZE/BANDAGES/DRESSINGS) ×1
BAG DECANTER FOR FLEXI CONT (MISCELLANEOUS) ×3 IMPLANT
BENZOIN TINCTURE PRP APPL 2/3 (GAUZE/BANDAGES/DRESSINGS) ×4 IMPLANT
BIT DRILL 4.0 SCREW (BIT) ×1 IMPLANT
BIT DRILL 4.0MM SCREW (BIT) ×1
BIT DRILL SCRW 3.5 (BIT) ×2 IMPLANT
BLADE CLIPPER SURG (BLADE) ×3 IMPLANT
BLADE SURG 11 STRL SS (BLADE) ×3 IMPLANT
BONE VIVIGEN FORMABLE 5.4CC (Bone Implant) ×3 IMPLANT
BUR MATCHSTICK NEURO 3.0 LAGG (BURR) ×3 IMPLANT
CANISTER SUCT 3000ML PPV (MISCELLANEOUS) ×3 IMPLANT
CAP LOCKING (Cap) IMPLANT
CARTRIDGE OIL MAESTRO DRILL (MISCELLANEOUS) ×1 IMPLANT
CLOSURE WOUND 1/2 X4 (GAUZE/BANDAGES/DRESSINGS) ×1
DECANTER SPIKE VIAL GLASS SM (MISCELLANEOUS) ×3 IMPLANT
DERMABOND ADVANCED (GAUZE/BANDAGES/DRESSINGS) ×2
DERMABOND ADVANCED .7 DNX12 (GAUZE/BANDAGES/DRESSINGS) ×1 IMPLANT
DIFFUSER DRILL AIR PNEUMATIC (MISCELLANEOUS) ×3 IMPLANT
DRAPE C-ARM 42X72 X-RAY (DRAPES) ×4 IMPLANT
DRAPE LAPAROTOMY 100X72 PEDS (DRAPES) ×3 IMPLANT
DRAPE MICROSCOPE LEICA (MISCELLANEOUS) ×2 IMPLANT
DRAPE SURG 17X23 STRL (DRAPES) ×12 IMPLANT
DRSG OPSITE POSTOP 4X8 (GAUZE/BANDAGES/DRESSINGS) ×2 IMPLANT
DURAPREP 6ML APPLICATOR 50/CS (WOUND CARE) ×3 IMPLANT
ELECT REM PT RETURN 9FT ADLT (ELECTROSURGICAL) ×3
ELECTRODE REM PT RTRN 9FT ADLT (ELECTROSURGICAL) ×1 IMPLANT
EVACUATOR 1/8 PVC DRAIN (DRAIN) ×2 IMPLANT
FIBER BOAT ALLOFUSE 7.5CC (Bone Implant) ×2 IMPLANT
GAUZE SPONGE 4X4 12PLY STRL (GAUZE/BANDAGES/DRESSINGS) ×3 IMPLANT
GAUZE SPONGE 4X4 16PLY XRAY LF (GAUZE/BANDAGES/DRESSINGS) IMPLANT
GLOVE BIO SURGEON STRL SZ7 (GLOVE) IMPLANT
GLOVE BIO SURGEON STRL SZ8 (GLOVE) ×3 IMPLANT
GLOVE BIOGEL PI IND STRL 6.5 (GLOVE) ×2 IMPLANT
GLOVE BIOGEL PI IND STRL 7.0 (GLOVE) IMPLANT
GLOVE BIOGEL PI INDICATOR 6.5 (GLOVE) ×4
GLOVE BIOGEL PI INDICATOR 7.0 (GLOVE)
GLOVE EXAM NITRILE LRG STRL (GLOVE) IMPLANT
GLOVE EXAM NITRILE XL STR (GLOVE) IMPLANT
GLOVE EXAM NITRILE XS STR PU (GLOVE) IMPLANT
GLOVE INDICATOR 8.5 STRL (GLOVE) ×3 IMPLANT
GLOVE SURG SS PI 6.5 STRL IVOR (GLOVE) ×4 IMPLANT
GOWN STRL REUS W/ TWL LRG LVL3 (GOWN DISPOSABLE) IMPLANT
GOWN STRL REUS W/ TWL XL LVL3 (GOWN DISPOSABLE) ×1 IMPLANT
GOWN STRL REUS W/TWL 2XL LVL3 (GOWN DISPOSABLE) IMPLANT
GOWN STRL REUS W/TWL LRG LVL3 (GOWN DISPOSABLE)
GOWN STRL REUS W/TWL XL LVL3 (GOWN DISPOSABLE) ×3
HEMOSTAT POWDER SURGIFOAM 1G (HEMOSTASIS) ×2 IMPLANT
IMPL QUARTEX 3.5X12MM (Neuro Prosthesis/Implant) IMPLANT
IMPLANT QUARTEX 3.5X12MM (Neuro Prosthesis/Implant) ×12 IMPLANT
KIT BASIN OR (CUSTOM PROCEDURE TRAY) ×3 IMPLANT
KIT TURNOVER KIT B (KITS) ×3 IMPLANT
LOCKING CAP (Cap) ×24 IMPLANT
MARKER SKIN DUAL TIP RULER LAB (MISCELLANEOUS) ×3 IMPLANT
MILL MEDIUM DISP (BLADE) ×2 IMPLANT
NDL HYPO 25X1 1.5 SAFETY (NEEDLE) ×1 IMPLANT
NDL SPNL 20GX3.5 QUINCKE YW (NEEDLE) ×1 IMPLANT
NEEDLE HYPO 25X1 1.5 SAFETY (NEEDLE) ×3 IMPLANT
NEEDLE SPNL 20GX3.5 QUINCKE YW (NEEDLE) ×3 IMPLANT
NS IRRIG 1000ML POUR BTL (IV SOLUTION) ×3 IMPLANT
OIL CARTRIDGE MAESTRO DRILL (MISCELLANEOUS) ×3
PACK LAMINECTOMY NEURO (CUSTOM PROCEDURE TRAY) ×3 IMPLANT
PAD ARMBOARD 7.5X6 YLW CONV (MISCELLANEOUS) ×9 IMPLANT
PIN MAYFIELD SKULL DISP (PIN) ×3 IMPLANT
ROD SPINE STR 4.0X80 (Rod) ×4 IMPLANT
RUBBERBAND STERILE (MISCELLANEOUS) ×4 IMPLANT
SCREW PA QUARTEX 4.5X20 (Screw) ×12 IMPLANT
SPONGE LAP 4X18 X RAY DECT (DISPOSABLE) IMPLANT
SPONGE SURGIFOAM ABS GEL 100 (HEMOSTASIS) ×3 IMPLANT
STRIP CLOSURE SKIN 1/2X4 (GAUZE/BANDAGES/DRESSINGS) ×2 IMPLANT
SUT ETHILON 4 0 PS 2 18 (SUTURE) ×2 IMPLANT
SUT VIC AB 0 CT1 18XCR BRD8 (SUTURE) ×1 IMPLANT
SUT VIC AB 0 CT1 8-18 (SUTURE) ×3
SUT VIC AB 2-0 CT1 18 (SUTURE) ×5 IMPLANT
SUT VIC AB 4-0 PS2 27 (SUTURE) ×3 IMPLANT
SYR CONTROL 10ML LL (SYRINGE) ×3 IMPLANT
TOWEL GREEN STERILE (TOWEL DISPOSABLE) ×3 IMPLANT
TOWEL GREEN STERILE FF (TOWEL DISPOSABLE) ×3 IMPLANT
TRAY FOLEY W/METER SILVER 16FR (SET/KITS/TRAYS/PACK) ×2 IMPLANT
WATER STERILE IRR 1000ML POUR (IV SOLUTION) ×3 IMPLANT

## 2017-08-22 NOTE — Anesthesia Procedure Notes (Signed)
Procedure Name: Intubation Date/Time: 08/22/2017 1:27 PM Performed by: Neldon Newport, CRNA Pre-anesthesia Checklist: Timeout performed, Patient being monitored, Suction available, Emergency Drugs available and Patient identified Patient Re-evaluated:Patient Re-evaluated prior to induction Oxygen Delivery Method: Circle system utilized Preoxygenation: Pre-oxygenation with 100% oxygen Induction Type: IV induction Ventilation: Mask ventilation without difficulty Laryngoscope Size: Glidescope and 3 (cervical collar remains inact.) Grade View: Grade I Tube type: Oral Tube size: 7.5 mm Number of attempts: 1 Placement Confirmation: breath sounds checked- equal and bilateral,  positive ETCO2 and ETT inserted through vocal cords under direct vision Secured at: 22 cm Tube secured with: Tape Dental Injury: Teeth and Oropharynx as per pre-operative assessment

## 2017-08-22 NOTE — Transfer of Care (Signed)
Immediate Anesthesia Transfer of Care Note  Patient: Curtis Vance  Procedure(s) Performed: Cervical five- six /Cervical six-seven /Cervical seven -Thoracic one Posterior Cervical with Lateral Mass Fixation (N/A )  Patient Location: PACU  Anesthesia Type:General  Level of Consciousness: awake  Airway & Oxygen Therapy: Patient Spontanous Breathing and Patient connected to nasal cannula oxygen  Post-op Assessment: Report given to RN and Post -op Vital signs reviewed and stable  Post vital signs: Reviewed and stable  Last Vitals:  Vitals Value Taken Time  BP 128/85 08/22/2017  5:25 PM  Temp 36.2 C 08/22/2017  5:26 PM  Pulse 79 08/22/2017  5:34 PM  Resp 18 08/22/2017  5:34 PM  SpO2 99 % 08/22/2017  5:34 PM  Vitals shown include unvalidated device data.  Last Pain:  Vitals:   08/22/17 1031  TempSrc:   PainSc: 2       Patients Stated Pain Goal: 2 (79/02/40 9735)  Complications: No apparent anesthesia complications

## 2017-08-22 NOTE — Anesthesia Preprocedure Evaluation (Addendum)
Anesthesia Evaluation  Patient identified by MRN, date of birth, ID band Patient awake    Reviewed: Allergy & Precautions, NPO status , Patient's Chart, lab work & pertinent test results  Airway Mallampati: II  TM Distance: >3 FB Neck ROM: Full    Dental no notable dental hx. (+) Dental Advidsory Given, Caps, Poor Dentition   Pulmonary shortness of breath and with exertion, COPD,  COPD inhaler, former smoker,    Pulmonary exam normal breath sounds clear to auscultation       Cardiovascular hypertension, Pt. on medications Normal cardiovascular exam Rhythm:Regular Rate:Normal     Neuro/Psych  Neuromuscular disease negative neurological ROS  negative psych ROS   GI/Hepatic negative GI ROS, Neg liver ROS,   Endo/Other  Hypothyroidism   Renal/GU negative Renal ROS  negative genitourinary   Musculoskeletal negative musculoskeletal ROS (+)   Abdominal   Peds negative pediatric ROS (+)  Hematology negative hematology ROS (+)   Anesthesia Other Findings Left front gold tooth and right front tooth broken off at gum line. Left arm weakness  Reproductive/Obstetrics negative OB ROS                           Anesthesia Physical Anesthesia Plan  ASA: III  Anesthesia Plan: General   Post-op Pain Management:    Induction: Intravenous  PONV Risk Score and Plan: 2 and Ondansetron and Treatment may vary due to age or medical condition  Airway Management Planned: Video Laryngoscope Planned and Oral ETT  Additional Equipment:   Intra-op Plan:   Post-operative Plan: Extubation in OR  Informed Consent: I have reviewed the patients History and Physical, chart, labs and discussed the procedure including the risks, benefits and alternatives for the proposed anesthesia with the patient or authorized representative who has indicated his/her understanding and acceptance.   Dental advisory given and  Dental Advisory Given  Plan Discussed with: CRNA, Anesthesiologist and Surgeon  Anesthesia Plan Comments:        Anesthesia Quick Evaluation

## 2017-08-22 NOTE — Op Note (Signed)
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  #2 posterior cervical fusion with the globus ellipse lateral mass and pedicle screw system with 12 mm lateral mass screws at C5 and C6 and 20 mm pedicle screws at C7 and T1 bilaterally  #3 posterior lateral arthrodesis C5-T1 utilizing locally harvested autograft mixed with vivigen and Allostem cortical fibers  Surgeon: Dominica Severin Trinidee Schrag  Asst.: [place] Bailiff  Anesthesia: Gen.  EBL: Minimal  History of present illness: 72 year old gentleman who 2 weeks ago was discharged hospital for management of a C6 fracture that at the time he is neurologically intact and the fracture was in alignment. Patient reported for his first follow-up and with the head expressing 2 weeks of hand weakness and difficulty walking. Workup with plain films and CT scan showed displacement is fracture severe stenosis with cord compression at C5-6 C6-7 and C7-T1. Due to patient's progression of clinical syndrome and imaging findings have recommended posterior cervical decompression and fusion from C5-T1. I extensively reviewed the risks and benefits of the operation the patient as well as perioperative course expectations of outcome and alternatives of surgery and he understood and agreed to proceed forward.  Operative procedure: This is brought into the or was induced on general anesthesia positioned prone in pins in the radiolucent head frame back 7 his neck was prepped and draped in routine sterile fashion. After infiltration of 10 mL lidocaine with epi a midline incision was made and Bovie electrocautery was used to take down the subcutaneous tissues and subperiosteal dissections care lamina from C5 down to T1. I exposed the lateral masses at C5 and C6 as  well as lateral masses and pedicle entry points at C7 and T1. I then performed central decompression by removing the spinous processes at C5, C6, C7 and T1. I extensively under bit the medial facet complexes performing foraminotomies of the C5, C6, C7, and C8 nerve roots. Concentrating heavily on the C6, C7 and C8 and C8 nerve roots due to patient's clinical presentation. There was marked spondylosis especially in the right-sided facet fracture and compression of the C7 and C8 nerve roots at that level. After adequate decompression achieved both centrally and foraminally intense taken screw placement lateral mass screws were placed at C5 and C6 and the inferomedial the superior lateral technique. Both sets of screws excellent purchase. Then pilot holes were drilled and under AP fluoroscopy AP, pedicle screws were placed at C7 and T1 utilizing 4.5 x 20 mm screws all screws excellent purchase. There is a slight medial and inferior breech on the C7 screw in the right but was not in contact with the C8 nerve root and the area captured the vast majority the vertebral body so I elected to leave behind. I inspected all the foramina to confirm patency and no compression and then the wound scope was irrigated meticulous in space was maintained aggressive decortication was care and lateral masses from C5-T1 the local harvested autograft mix was packed posterior laterally from C5-T1 then the rods were selected the anchored in placed top tightening nuts were anchored down in place and then the wound was closed after Gelfoam was opened up the dura vancomycin prior was speckled and the wound intact gross injected in the fascia. The wounds closed in layers with after Vicryl skin was closed running  4 subcuticular Dermabond benzo and Steri-Strips and a sterile dressing was applied patient recovered in stable condition. At the end the case all needle counts sponge counts were correct.
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
## 2017-08-23 LAB — MRSA PCR SCREENING: MRSA by PCR: NEGATIVE

## 2017-08-23 MED ORDER — ENSURE ENLIVE PO LIQD
237.0000 mL | Freq: Two times a day (BID) | ORAL | Status: DC
  Administered 2017-08-24 – 2017-09-04 (×22): 237 mL via ORAL

## 2017-08-23 MED ORDER — FAMOTIDINE 20 MG PO TABS
20.0000 mg | ORAL_TABLET | Freq: Two times a day (BID) | ORAL | Status: DC
  Administered 2017-08-23 – 2017-09-04 (×24): 20 mg via ORAL
  Filled 2017-08-23 (×24): qty 1

## 2017-08-23 MED ORDER — DEXAMETHASONE SODIUM PHOSPHATE 10 MG/ML IJ SOLN: 10.0000 mg | Freq: Four times a day (QID) | INTRAMUSCULAR | Status: DC

## 2017-08-23 MED FILL — Thrombin For Soln 5000 Unit: CUTANEOUS | Qty: 5000 | Status: AC

## 2017-08-23 MED FILL — Thrombin For Soln 20000 Unit: CUTANEOUS | Qty: 1 | Status: AC
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
## 2017-08-23 NOTE — Anesthesia Postprocedure Evaluation (Signed)
Anesthesia Post Note  Patient: Curtis Vance  Procedure(s) Performed: Cervical five- six /Cervical six-seven /Cervical seven -Thoracic one Posterior Cervical with Lateral Mass Fixation (N/A )     Patient location during evaluation: PACU Anesthesia Type: General Level of consciousness: awake Pain management: pain level controlled Vital Signs Assessment: post-procedure vital signs reviewed and stable Respiratory status: spontaneous breathing Cardiovascular status: stable Anesthetic complications: no    Last Vitals:  Vitals:   08/23/17 0300 08/23/17 1235  BP: 131/89   Pulse: 84   Resp: 20   Temp: 36.8 C 36.8 C  SpO2: 100%     Last Pain:  Vitals:   08/23/17 0836  TempSrc:   PainSc: 0-No pain                 Saraih Lorton

## 2017-08-23 NOTE — Progress Notes (Signed)
Subjective: Patient reports Overall patient doing well with neck pain and arm pain significant improvement numbness tingling in his hands does have some inability to open up his right hand and weakness in his right tricep.  Objective: Vital signs in last 24 hours: Temp:  [97.2 F (36.2 C)-98.2 F (36.8 C)] 98.2 F (36.8 C) (04/30 0300) Pulse Rate:  [60-94] 84 (04/30 0300) Resp:  [13-21] 20 (04/30 0300) BP: (117-157)/(71-95) 131/89 (04/30 0300) SpO2:  [95 %-100 %] 100 % (04/30 0300) Weight:  [74.8 kg (165 lb)-79.3 kg (174 lb 13.2 oz)] 79.3 kg (174 lb 13.2 oz) (04/29 2102)  Intake/Output from previous day: 04/29 0701 - 04/30 0700 In: 2240.8 [P.O.:240; I.V.:1850.8; IV Piggyback:150] Out: 920 [Urine:525; Drains:245; Blood:150] Intake/Output this shift: No intake/output data recorded.  Tricep 4 minus out of 5 wrist extension weakness otherwise neurologically improved from preop.  Lab Results: Recent Labs    08/22/17 1006  WBC 11.1*  HGB 12.9*  HCT 38.5*  PLT 449*   BMET Recent Labs    08/22/17 1006  NA 138  K 3.6  CL 100*  CO2 27  GLUCOSE 90  BUN 15  CREATININE 1.02  CALCIUM 9.5    Studies/Results: Dg Cervical Spine 1 View  Result Date: 08/22/2017 CLINICAL DATA:  Cervical 5-6/Cervical 6-7/Cervical 7-Thoracic 1 Posterior Cervical w/ lateral mass fixation. Fluoro time: 24 sec EXAM: DG CERVICAL SPINE - 1 VIEW; DG C-ARM 61-120 MIN COMPARISON:  CT 08/18/2017 FINDINGS: Single fluoroscopic spot image documents placement of posterior fusion hardware bilaterally C5 -T1. anterior fixation hardware C3-4 and C5-6 again noted. IMPRESSION: 1. Intraoperative hardware placement C5-T1. Electronically Signed   By: Lucrezia Europe M.D.   On: 08/22/2017 17:15   Dg C-arm 1-60 Min  Result Date: 08/22/2017 CLINICAL DATA:  Cervical 5-6/Cervical 6-7/Cervical 7-Thoracic 1 Posterior Cervical w/ lateral mass fixation. Fluoro time: 24 sec EXAM: DG CERVICAL SPINE - 1 VIEW; DG C-ARM 61-120 MIN  COMPARISON:  CT 08/18/2017 FINDINGS: Single fluoroscopic spot image documents placement of posterior fusion hardware bilaterally C5 -T1. anterior fixation hardware C3-4 and C5-6 again noted. IMPRESSION: 1. Intraoperative hardware placement C5-T1. Electronically Signed   By: Lucrezia Europe M.D.   On: 08/22/2017 17:15   Dg C-arm 1-60 Min  Result Date: 08/22/2017 CLINICAL DATA:  Cervical 5-6/Cervical 6-7/Cervical 7-Thoracic 1 Posterior Cervical w/ lateral mass fixation. Fluoro time: 24 sec EXAM: DG CERVICAL SPINE - 1 VIEW; DG C-ARM 61-120 MIN COMPARISON:  CT 08/18/2017 FINDINGS: Single fluoroscopic spot image documents placement of posterior fusion hardware bilaterally C5 -T1. anterior fixation hardware C3-4 and C5-6 again noted. IMPRESSION: 1. Intraoperative hardware placement C5-T1. Electronically Signed   By: Lucrezia Europe M.D.   On: 08/22/2017 17:15    Assessment/Plan: Postop day 1 posterior cervical decompressive laminectomy and fusion with a right C7 neurapraxia. We'll treat this with Decadron and gabapentin for the next 24 hours if we do not see any sign of improvement will reimage with CT scan of his neck. He had a extensive amount of compression on the right C7 nerve root from the fracture and decompression was fairly extensive to possibly create some swelling and irritation of that nerve root.  LOS: 1 day     Estephani Popper P 08/23/2017, 7:30 AM
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
## 2017-08-23 NOTE — Social Work (Signed)
CSW acknowledging consult for SNF placement, await PT/OT evaluations.  Alexander Mt, [place] Work [phone]

## 2017-08-24 LAB — PROTIME-INR
INR: 1.16
Prothrombin Time: 14.8 seconds (ref 11.4–15.2)

## 2017-08-24 LAB — CBC WITH DIFFERENTIAL/PLATELET
BASOS PCT: 0 %
Basophils Absolute: 0 10*3/uL (ref 0.0–0.1)
EOS PCT: 0 %
Eosinophils Absolute: 0 10*3/uL (ref 0.0–0.7)
HCT: 31.8 % — ABNORMAL LOW (ref 39.0–52.0)
Hemoglobin: 10.6 g/dL — ABNORMAL LOW (ref 13.0–17.0)
LYMPHS PCT: 3 %
Lymphs Abs: 0.6 10*3/uL — ABNORMAL LOW (ref 0.7–4.0)
MCH: 29.9 pg (ref 26.0–34.0)
MCHC: 33.3 g/dL (ref 30.0–36.0)
MCV: 89.8 fL (ref 78.0–100.0)
MONO ABS: 1.4 10*3/uL — AB (ref 0.1–1.0)
Monocytes Relative: 7 %
Neutro Abs: 17.6 10*3/uL — ABNORMAL HIGH (ref 1.7–7.7)
Neutrophils Relative %: 90 %
PLATELETS: 398 10*3/uL (ref 150–400)
RBC: 3.54 MIL/uL — ABNORMAL LOW (ref 4.22–5.81)
RDW: 14.5 % (ref 11.5–15.5)
WBC: 19.7 10*3/uL — ABNORMAL HIGH (ref 4.0–10.5)
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
## 2017-08-24 NOTE — Progress Notes (Signed)
Patient ID: Curtis Vance, male   DOB: 11/17/1943, 72 y.o.   MRN: [id number] Patient overall doing better today drainage seems to be slowing down however still a fair amount of incisional drainage. Did redress the incision will take a look at the morning and has not slowed down significantly by the morning we'll plan on OR for revision of cervical incision.

## 2017-08-24 NOTE — Progress Notes (Signed)
Subjective: Patient reports Overall patient doing better increased right upper cavity function more movement his fingers. However increased drainage from his neck incision.  Objective: Vital signs in last 24 hours: Temp:  [97.8 F (36.6 C)-98.2 F (36.8 C)] 97.8 F (36.6 C) (05/01 0451) Pulse Rate:  [71-90] 71 (05/01 0451) BP: (108-139)/(66-86) 121/81 (05/01 0451) SpO2:  [99 %-100 %] 100 % (05/01 0451)  Intake/Output from previous day: 04/30 0701 - 05/01 0700 In: 561.5 [P.O.:240; I.V.:21.5; IV Piggyback:300] Out: 1000 [Urine:1000] Intake/Output this shift: No intake/output data recorded.   wrist extensiion now 4+ out of 5 still has some trouble with posterior interosseous or finger extension. Triceps strength improved to 4-4+ out of 5 incision with slight dehiscence superficially in the upper third of the incision however each end is intact. Seems like this might be the source of drainage the Steri-Strips were soaked and I removed them. I cleaned off the incision applied a sterile dressing and do not think he needs to be reclosed at this point consist superficial dehiscence of continue the patient on IV antibiotic.  Lab Results: Recent Labs    08/22/17 1006  WBC 11.1*  HGB 12.9*  HCT 38.5*  PLT 449*   BMET Recent Labs    08/22/17 1006  NA 138  K 3.6  CL 100*  CO2 27  GLUCOSE 90  BUN 15  CREATININE 1.02  CALCIUM 9.5    Studies/Results: Dg Cervical Spine 1 View  Result Date: 08/22/2017 CLINICAL DATA:  Cervical 5-6/Cervical 6-7/Cervical 7-Thoracic 1 Posterior Cervical w/ lateral mass fixation. Fluoro time: 24 sec EXAM: DG CERVICAL SPINE - 1 VIEW; DG C-ARM 61-120 MIN COMPARISON:  CT 08/18/2017 FINDINGS: Single fluoroscopic spot image documents placement of posterior fusion hardware bilaterally C5 -T1. anterior fixation hardware C3-4 and C5-6 again noted. IMPRESSION: 1. Intraoperative hardware placement C5-T1. Electronically Signed   By: Lucrezia Europe M.D.   On: 08/22/2017  17:15   Dg C-arm 1-60 Min  Result Date: 08/22/2017 CLINICAL DATA:  Cervical 5-6/Cervical 6-7/Cervical 7-Thoracic 1 Posterior Cervical w/ lateral mass fixation. Fluoro time: 24 sec EXAM: DG CERVICAL SPINE - 1 VIEW; DG C-ARM 61-120 MIN COMPARISON:  CT 08/18/2017 FINDINGS: Single fluoroscopic spot image documents placement of posterior fusion hardware bilaterally C5 -T1. anterior fixation hardware C3-4 and C5-6 again noted. IMPRESSION: 1. Intraoperative hardware placement C5-T1. Electronically Signed   By: Lucrezia Europe M.D.   On: 08/22/2017 17:15   Dg C-arm 1-60 Min  Result Date: 08/22/2017 CLINICAL DATA:  Cervical 5-6/Cervical 6-7/Cervical 7-Thoracic 1 Posterior Cervical w/ lateral mass fixation. Fluoro time: 24 sec EXAM: DG CERVICAL SPINE - 1 VIEW; DG C-ARM 61-120 MIN COMPARISON:  CT 08/18/2017 FINDINGS: Single fluoroscopic spot image documents placement of posterior fusion hardware bilaterally C5 -T1. anterior fixation hardware C3-4 and C5-6 again noted. IMPRESSION: 1. Intraoperative hardware placement C5-T1. Electronically Signed   By: Lucrezia Europe M.D.   On: 08/22/2017 17:15    Assessment/Plan: Continue wound changes IV antibiotics physical and occupational therapy possible discharge tomorrow if patient progresses well today.  LOS: 2 days     Saarah Dewing P 08/24/2017, 8:13 AM

## 2017-08-25 ENCOUNTER — Inpatient Hospital Stay (HOSPITAL_COMMUNITY): Payer: Medicare Other | Admitting: Certified Registered"

## 2017-08-25 ENCOUNTER — Encounter (HOSPITAL_COMMUNITY): Payer: Self-pay | Admitting: Certified Registered"

## 2017-08-25 ENCOUNTER — Encounter (HOSPITAL_COMMUNITY)
Admission: RE | Disposition: A | Discharge status: Home / Self Care | Payer: Self-pay | Source: Home / Self Care | Attending: Neurosurgery

## 2017-08-25 DIAGNOSIS — T8130XA Disruption of wound, unspecified, initial encounter: Secondary | ICD-10-CM | POA: Diagnosis present

## 2017-08-25 HISTORY — PX: POSTERIOR CERVICAL LAMINECTOMY: SHX2248

## 2017-08-25 SURGERY — POSTERIOR CERVICAL LAMINECTOMY
Anesthesia: General | Site: Neck

## 2017-08-25 MED ORDER — CEFAZOLIN SODIUM 1 G IJ SOLR
INTRAMUSCULAR | Status: AC
  Filled 2017-08-25: qty 20

## 2017-08-25 MED ORDER — PHENYLEPHRINE 40 MCG/ML (10ML) SYRINGE FOR IV PUSH (FOR BLOOD PRESSURE SUPPORT)
PREFILLED_SYRINGE | INTRAVENOUS | Status: AC
  Filled 2017-08-25: qty 10

## 2017-08-25 MED ORDER — BACITRACIN ZINC 500 UNIT/GM EX OINT
TOPICAL_OINTMENT | CUTANEOUS | Status: DC | PRN
  Administered 2017-08-25: 1 via TOPICAL

## 2017-08-25 MED ORDER — DEXAMETHASONE SODIUM PHOSPHATE 10 MG/ML IJ SOLN
INTRAMUSCULAR | Status: AC
  Filled 2017-08-25: qty 1

## 2017-08-25 MED ORDER — MEPERIDINE HCL 50 MG/ML IJ SOLN: 6.2500 mg | INTRAMUSCULAR | Status: DC | PRN

## 2017-08-25 MED ORDER — THROMBIN 5000 UNITS EX SOLR
CUTANEOUS | Status: AC
  Filled 2017-08-25: qty 15000

## 2017-08-25 MED ORDER — OXYCODONE HCL 5 MG PO TABS
ORAL_TABLET | ORAL | Status: AC
  Filled 2017-08-25: qty 2

## 2017-08-25 MED ORDER — PROPOFOL 10 MG/ML IV BOLUS
INTRAVENOUS | Status: AC
  Filled 2017-08-25: qty 20

## 2017-08-25 MED ORDER — HYDROMORPHONE HCL 2 MG/ML IJ SOLN
0.2500 mg | INTRAMUSCULAR | Status: DC | PRN
  Administered 2017-08-25: 0.5 mg via INTRAVENOUS

## 2017-08-25 MED ORDER — CYCLOBENZAPRINE HCL 10 MG PO TABS
ORAL_TABLET | ORAL | Status: AC
  Filled 2017-08-25: qty 1

## 2017-08-25 MED ORDER — VITAMIN D 1000 UNITS PO TABS
2000.0000 [IU] | ORAL_TABLET | Freq: Every day | ORAL | Status: DC
  Administered 2017-08-26 – 2017-09-04 (×10): 2000 [IU] via ORAL
  Filled 2017-08-25 (×12): qty 2

## 2017-08-25 MED ORDER — BACITRACIN ZINC 500 UNIT/GM EX OINT
TOPICAL_OINTMENT | CUTANEOUS | Status: AC
  Filled 2017-08-25: qty 28.35

## 2017-08-25 MED ORDER — CEFAZOLIN SODIUM-DEXTROSE 2-3 GM-%(50ML) IV SOLR
INTRAVENOUS | Status: DC | PRN
  Administered 2017-08-25: 2 g via INTRAVENOUS

## 2017-08-25 MED ORDER — ONDANSETRON HCL 4 MG/2ML IJ SOLN
INTRAMUSCULAR | Status: AC
  Filled 2017-08-25: qty 2

## 2017-08-25 MED ORDER — HYDROMORPHONE HCL 2 MG/ML IJ SOLN
INTRAMUSCULAR | Status: AC
  Filled 2017-08-25: qty 1

## 2017-08-25 MED ORDER — LIDOCAINE 2% (20 MG/ML) 5 ML SYRINGE
INTRAMUSCULAR | Status: DC | PRN
  Administered 2017-08-25: 100 mg via INTRAVENOUS

## 2017-08-25 MED ORDER — LACTATED RINGERS IV SOLN
INTRAVENOUS | Status: DC
  Administered 2017-08-25: 15:00:00 via INTRAVENOUS

## 2017-08-25 MED ORDER — LIDOCAINE 2% (20 MG/ML) 5 ML SYRINGE
INTRAMUSCULAR | Status: AC
  Filled 2017-08-25: qty 5

## 2017-08-25 MED ORDER — ONDANSETRON HCL 4 MG/2ML IJ SOLN: 4.0000 mg | Freq: Once | INTRAMUSCULAR | Status: DC | PRN

## 2017-08-25 MED ORDER — GLYCOPYRROLATE 0.2 MG/ML IV SOSY
PREFILLED_SYRINGE | INTRAVENOUS | Status: DC | PRN
  Administered 2017-08-25: .2 mg via INTRAVENOUS

## 2017-08-25 MED ORDER — FENTANYL CITRATE (PF) 250 MCG/5ML IJ SOLN
INTRAMUSCULAR | Status: AC
  Filled 2017-08-25: qty 5

## 2017-08-25 MED ORDER — ONDANSETRON HCL 4 MG/2ML IJ SOLN
INTRAMUSCULAR | Status: DC | PRN
  Administered 2017-08-25: 4 mg via INTRAVENOUS

## 2017-08-25 MED ORDER — PHENYLEPHRINE 40 MCG/ML (10ML) SYRINGE FOR IV PUSH (FOR BLOOD PRESSURE SUPPORT)
PREFILLED_SYRINGE | INTRAVENOUS | Status: DC | PRN
  Administered 2017-08-25: 80 ug via INTRAVENOUS
  Administered 2017-08-25: 120 ug via INTRAVENOUS

## 2017-08-25 MED ORDER — SUGAMMADEX SODIUM 200 MG/2ML IV SOLN
INTRAVENOUS | Status: DC | PRN
  Administered 2017-08-25: 200 mg via INTRAVENOUS

## 2017-08-25 MED ORDER — LIDOCAINE-EPINEPHRINE 1 %-1:100000 IJ SOLN
INTRAMUSCULAR | Status: AC
  Filled 2017-08-25: qty 1

## 2017-08-25 MED ORDER — ROCURONIUM BROMIDE 10 MG/ML (PF) SYRINGE
PREFILLED_SYRINGE | INTRAVENOUS | Status: AC
  Filled 2017-08-25: qty 5

## 2017-08-25 MED ORDER — 0.9 % SODIUM CHLORIDE (POUR BTL) OPTIME
TOPICAL | Status: DC | PRN
  Administered 2017-08-25: 1000 mL

## 2017-08-25 MED ORDER — FENTANYL CITRATE (PF) 250 MCG/5ML IJ SOLN
INTRAMUSCULAR | Status: DC | PRN
  Administered 2017-08-25 (×2): 75 ug via INTRAVENOUS

## 2017-08-25 MED ORDER — DEXAMETHASONE SODIUM PHOSPHATE 10 MG/ML IJ SOLN
INTRAMUSCULAR | Status: DC | PRN
  Administered 2017-08-25: 10 mg via INTRAVENOUS

## 2017-08-25 MED ORDER — ROCURONIUM BROMIDE 10 MG/ML (PF) SYRINGE
PREFILLED_SYRINGE | INTRAVENOUS | Status: DC | PRN
  Administered 2017-08-25: 50 mg via INTRAVENOUS

## 2017-08-25 MED ORDER — BUPIVACAINE HCL (PF) 0.25 % IJ SOLN
INTRAMUSCULAR | Status: AC
  Filled 2017-08-25: qty 30

## 2017-08-25 MED ORDER — SODIUM CHLORIDE 0.9 % IJ SOLN
INTRAMUSCULAR | Status: AC
  Filled 2017-08-25: qty 10

## 2017-08-25 MED ORDER — SODIUM CHLORIDE 0.9 % IV SOLN
INTRAVENOUS | Status: DC | PRN
  Administered 2017-08-25: 17:00:00

## 2017-08-25 MED ORDER — PROPOFOL 10 MG/ML IV BOLUS
INTRAVENOUS | Status: DC | PRN
  Administered 2017-08-25: 40 mg via INTRAVENOUS
  Administered 2017-08-25: 50 mg via INTRAVENOUS
  Administered 2017-08-25: 150 mg via INTRAVENOUS

## 2017-08-25 SURGICAL SUPPLY — 69 items
ADH SKN CLS APL DERMABOND .7 (GAUZE/BANDAGES/DRESSINGS) ×1
ADH SKN CLS LQ APL DERMABOND (GAUZE/BANDAGES/DRESSINGS) ×1
APL SKNCLS STERI-STRIP NONHPOA (GAUZE/BANDAGES/DRESSINGS) ×2
BAG DECANTER FOR FLEXI CONT (MISCELLANEOUS) ×3 IMPLANT
BENZOIN TINCTURE PRP APPL 2/3 (GAUZE/BANDAGES/DRESSINGS) ×6 IMPLANT
BLADE CLIPPER SURG (BLADE) ×3 IMPLANT
BLADE SURG 11 STRL SS (BLADE) ×3 IMPLANT
BUR MATCHSTICK NEURO 3.0 LAGG (BURR) IMPLANT
CANISTER SUCT 3000ML PPV (MISCELLANEOUS) ×3 IMPLANT
CARTRIDGE OIL MAESTRO DRILL (MISCELLANEOUS) ×1 IMPLANT
CLOSURE WOUND 1/2 X4 (GAUZE/BANDAGES/DRESSINGS) ×1
DECANTER SPIKE VIAL GLASS SM (MISCELLANEOUS) IMPLANT
DERMABOND ADHESIVE PROPEN (GAUZE/BANDAGES/DRESSINGS) ×2
DERMABOND ADVANCED (GAUZE/BANDAGES/DRESSINGS) ×2
DERMABOND ADVANCED .7 DNX12 (GAUZE/BANDAGES/DRESSINGS) ×1 IMPLANT
DERMABOND ADVANCED .7 DNX6 (GAUZE/BANDAGES/DRESSINGS) IMPLANT
DIFFUSER DRILL AIR PNEUMATIC (MISCELLANEOUS) IMPLANT
DRAPE C-ARM 42X72 X-RAY (DRAPES) ×2 IMPLANT
DRAPE LAPAROTOMY 100X72 PEDS (DRAPES) ×3 IMPLANT
DRAPE MICROSCOPE LEICA (MISCELLANEOUS) IMPLANT
DRAPE SURG 17X23 STRL (DRAPES) ×3 IMPLANT
DRSG OPSITE POSTOP 4X6 (GAUZE/BANDAGES/DRESSINGS) IMPLANT
DRSG OPSITE POSTOP 4X8 (GAUZE/BANDAGES/DRESSINGS) ×2 IMPLANT
DURAPREP 26ML APPLICATOR (WOUND CARE) ×3 IMPLANT
ELECT REM PT RETURN 9FT ADLT (ELECTROSURGICAL) ×3
ELECTRODE REM PT RTRN 9FT ADLT (ELECTROSURGICAL) ×1 IMPLANT
GAUZE SPONGE 4X4 12PLY STRL (GAUZE/BANDAGES/DRESSINGS) ×1 IMPLANT
GAUZE SPONGE 4X4 16PLY XRAY LF (GAUZE/BANDAGES/DRESSINGS) IMPLANT
GLOVE BIO SURGEON STRL SZ 6.5 (GLOVE) ×1 IMPLANT
GLOVE BIO SURGEON STRL SZ7 (GLOVE) IMPLANT
GLOVE BIO SURGEON STRL SZ8 (GLOVE) ×3 IMPLANT
GLOVE BIO SURGEONS STRL SZ 6.5 (GLOVE) ×1
GLOVE BIOGEL PI IND STRL 7.0 (GLOVE) IMPLANT
GLOVE BIOGEL PI INDICATOR 7.0 (GLOVE)
GLOVE EXAM NITRILE LRG STRL (GLOVE) IMPLANT
GLOVE EXAM NITRILE XL STR (GLOVE) IMPLANT
GLOVE EXAM NITRILE XS STR PU (GLOVE) IMPLANT
GLOVE INDICATOR 8.5 STRL (GLOVE) ×3 IMPLANT
GOWN STRL REUS W/ TWL LRG LVL3 (GOWN DISPOSABLE) IMPLANT
GOWN STRL REUS W/ TWL XL LVL3 (GOWN DISPOSABLE) ×1 IMPLANT
GOWN STRL REUS W/TWL 2XL LVL3 (GOWN DISPOSABLE) IMPLANT
GOWN STRL REUS W/TWL LRG LVL3 (GOWN DISPOSABLE)
GOWN STRL REUS W/TWL XL LVL3 (GOWN DISPOSABLE) ×3
KIT BASIN OR (CUSTOM PROCEDURE TRAY) ×3 IMPLANT
KIT TURNOVER KIT B (KITS) ×3 IMPLANT
MARKER SKIN DUAL TIP RULER LAB (MISCELLANEOUS) ×3 IMPLANT
NDL HYPO 25X1 1.5 SAFETY (NEEDLE) ×1 IMPLANT
NDL SPNL 20GX3.5 QUINCKE YW (NEEDLE) ×1 IMPLANT
NEEDLE HYPO 25X1 1.5 SAFETY (NEEDLE) IMPLANT
NEEDLE SPNL 20GX3.5 QUINCKE YW (NEEDLE) IMPLANT
NS IRRIG 1000ML POUR BTL (IV SOLUTION) ×3 IMPLANT
OIL CARTRIDGE MAESTRO DRILL (MISCELLANEOUS) ×3
PACK LAMINECTOMY NEURO (CUSTOM PROCEDURE TRAY) ×3 IMPLANT
PAD ARMBOARD 7.5X6 YLW CONV (MISCELLANEOUS) ×9 IMPLANT
PIN MAYFIELD SKULL DISP (PIN) ×3 IMPLANT
RUBBERBAND STERILE (MISCELLANEOUS) IMPLANT
SPONGE LAP 4X18 X RAY DECT (DISPOSABLE) IMPLANT
SPONGE SURGIFOAM ABS GEL SZ50 (HEMOSTASIS) ×1 IMPLANT
STRIP CLOSURE SKIN 1/2X4 (GAUZE/BANDAGES/DRESSINGS) ×2 IMPLANT
SUT ETHILON 3 0 FSL (SUTURE) ×2 IMPLANT
SUT ETHILON 4 0 PS 2 18 (SUTURE) IMPLANT
SUT VIC AB 0 CT1 18XCR BRD8 (SUTURE) ×1 IMPLANT
SUT VIC AB 0 CT1 8-18 (SUTURE) ×3
SUT VIC AB 2-0 CT1 18 (SUTURE) ×5 IMPLANT
SUT VICRYL 4-0 PS2 18IN ABS (SUTURE) ×3 IMPLANT
TOWEL GREEN STERILE (TOWEL DISPOSABLE) ×3 IMPLANT
TOWEL GREEN STERILE FF (TOWEL DISPOSABLE) ×3 IMPLANT
TRAY FOLEY MTR SLVR 16FR STAT (SET/KITS/TRAYS/PACK) IMPLANT
WATER STERILE IRR 1000ML POUR (IV SOLUTION) ×3 IMPLANT
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
## 2017-08-25 NOTE — Anesthesia Procedure Notes (Signed)
Procedure Name: Intubation Date/Time: 08/25/2017 4:05 PM Performed by: Imagene Riches, CRNA Pre-anesthesia Checklist: Patient identified, Emergency Drugs available, Suction available and Patient being monitored Patient Re-evaluated:Patient Re-evaluated prior to induction Oxygen Delivery Method: Circle System Utilized Preoxygenation: Pre-oxygenation with 100% oxygen Induction Type: IV induction Ventilation: Mask ventilation without difficulty Laryngoscope Size: Glidescope and 3 Grade View: Grade I Tube type: Oral Tube size: 7.5 mm Number of attempts: 1 Airway Equipment and Method: Stylet,  Oral airway and Video-laryngoscopy Placement Confirmation: ETT inserted through vocal cords under direct vision,  positive ETCO2 and breath sounds checked- equal and bilateral Secured at: 23 cm Tube secured with: Tape Dental Injury: Teeth and Oropharynx as per pre-operative assessment
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
## 2017-08-25 NOTE — Transfer of Care (Signed)
Immediate Anesthesia Transfer of Care Note  Patient: SUNG PARODI  Procedure(s) Performed: Cervical Wound Debridement (N/A Neck)  Patient Location: PACU  Anesthesia Type:General  Level of Consciousness: awake, alert  and oriented  Airway & Oxygen Therapy: Patient Spontanous Breathing and Patient connected to nasal cannula oxygen  Post-op Assessment: Report given to RN and Post -op Vital signs reviewed and stable  Post vital signs: Reviewed and stable  Last Vitals:  Vitals Value Taken Time  BP 159/98 08/25/2017  5:02 PM  Temp 36.3 C 08/25/2017  5:00 PM  Pulse 105 08/25/2017  5:03 PM  Resp 15 08/25/2017  5:03 PM  SpO2 100 % 08/25/2017  5:03 PM  Vitals shown include unvalidated device data.  Last Pain:  Vitals:   08/25/17 0829  TempSrc:   PainSc: 5       Patients Stated Pain Goal: 1 (32/44/01 0272)  Complications: No apparent anesthesia complications

## 2017-08-25 NOTE — Anesthesia Postprocedure Evaluation (Signed)
Anesthesia Post Note  Patient: Curtis Vance  Procedure(s) Performed: Cervical Wound Debridement (N/A Neck)     Patient location during evaluation: PACU Anesthesia Type: General Level of consciousness: awake and alert, oriented and patient cooperative Pain management: pain level controlled Vital Signs Assessment: post-procedure vital signs reviewed and stable Respiratory status: spontaneous breathing, nonlabored ventilation and respiratory function stable Cardiovascular status: blood pressure returned to baseline and stable Postop Assessment: no apparent nausea or vomiting (moves all extrem) Anesthetic complications: no    Last Vitals:  Vitals:   08/25/17 1735 08/25/17 1745  BP: (!) 152/99 (!) 134/99  Pulse: 96 86  Resp: (!) 21 17  Temp: (!) 36.3 C   SpO2: 96% 99%    Last Pain:  Vitals:   08/25/17 1730  TempSrc:   PainSc: 7                  Oriya Kettering,E. Taelor Moncada

## 2017-08-25 NOTE — Anesthesia Preprocedure Evaluation (Signed)
Anesthesia Evaluation  Patient identified by MRN, date of birth, ID band Patient awake    Reviewed: Allergy & Precautions, NPO status , Patient's Chart, lab work & pertinent test results  Airway Mallampati: III  TM Distance: >3 FB Neck ROM: Full    Dental   Pulmonary COPD, former smoker,    Pulmonary exam normal        Cardiovascular hypertension, Pt. on medications Normal cardiovascular exam     Neuro/Psych    GI/Hepatic GERD  Medicated and Controlled,  Endo/Other    Renal/GU      Musculoskeletal   Abdominal   Peds  Hematology   Anesthesia Other Findings   Reproductive/Obstetrics                             Anesthesia Physical Anesthesia Plan  ASA: III  Anesthesia Plan: General   Post-op Pain Management:    Induction: Intravenous  PONV Risk Score and Plan:   Airway Management Planned: Video Laryngoscope Planned and Oral ETT  Additional Equipment:   Intra-op Plan:   Post-operative Plan: Extubation in OR  Informed Consent: I have reviewed the patients History and Physical, chart, labs and discussed the procedure including the risks, benefits and alternatives for the proposed anesthesia with the patient or authorized representative who has indicated his/her understanding and acceptance.     Plan Discussed with: CRNA and Surgeon  Anesthesia Plan Comments:         Anesthesia Quick Evaluation
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
## 2017-08-25 NOTE — Progress Notes (Signed)
Patient ID: Curtis Vance, male   DOB: 11/17/1943, 72 y.o.   MRN: [id number] Role patient doing better drainage has slowed down however there is still a section the middle of the incision that is dehisced. Due to the fact that he had a posterior cervical fusion this area does not seem to be approximating I recommended I and D of posterior cervical wound. I've extensively gone over the risks and benefits of the procedure with him as well as perioperative course expectations of outcome and alternatives of surgery and he understands and agrees to proceed forward.

## 2017-08-26 ENCOUNTER — Encounter (HOSPITAL_COMMUNITY): Payer: Self-pay | Admitting: Neurosurgery

## 2017-08-26 ENCOUNTER — Other Ambulatory Visit: Payer: Self-pay

## 2017-08-26 MED ORDER — DOCUSATE SODIUM 100 MG PO CAPS
100.0000 mg | ORAL_CAPSULE | Freq: Every day | ORAL | Status: DC
  Administered 2017-08-26 – 2017-09-04 (×10): 100 mg via ORAL
  Filled 2017-08-26 (×10): qty 1

## 2017-08-26 MED ORDER — POLYETHYLENE GLYCOL 3350 17 G PO PACK
17.0000 g | PACK | Freq: Every day | ORAL | Status: DC | PRN
  Administered 2017-08-26 – 2017-08-31 (×3): 17 g via ORAL
  Filled 2017-08-26 (×3): qty 1

## 2017-08-26 MED ORDER — GABAPENTIN 300 MG PO CAPS
300.0000 mg | ORAL_CAPSULE | Freq: Three times a day (TID) | ORAL | Status: DC
  Administered 2017-08-26 – 2017-09-04 (×29): 300 mg via ORAL
  Filled 2017-08-26 (×29): qty 1

## 2017-08-26 MED ORDER — SENNOSIDES-DOCUSATE SODIUM 8.6-50 MG PO TABS
1.0000 | ORAL_TABLET | Freq: Every day | ORAL | Status: DC
  Administered 2017-08-26 – 2017-08-31 (×6): 1 via ORAL
  Filled 2017-08-26 (×6): qty 1

## 2017-08-26 NOTE — Plan of Care (Signed)
  Problem: Safety: Goal: Ability to remain free from injury will improve Outcome: Progressing   Problem: Education: Goal: Knowledge of the prescribed therapeutic regimen will improve Outcome: Progressing   Problem: Activity: Goal: Ability to tolerate increased activity will improve Outcome: Progressing   Problem: Health Behavior/Discharge Planning: Goal: Identification of resources available to assist in meeting health care needs will improve Outcome: Progressing   Problem: Nutrition: Goal: Maintenance of adequate nutrition will improve Outcome: Progressing   Problem: Clinical Measurements: Goal: Complications related to the disease process, condition or treatment will be avoided or minimized Outcome: Progressing   Problem: Respiratory: Goal: Will regain and/or maintain adequate ventilation Outcome: Progressing   Problem: Skin Integrity: Goal: Demonstration of wound healing without infection will improve Outcome: Progressing   Problem: Activity: Goal: Ability to avoid complications of mobility impairment will improve Outcome: Progressing Goal: Ability to tolerate increased activity will improve Outcome: Progressing Goal: Will remain free from falls Outcome: Progressing   Problem: Bowel/Gastric: Goal: Gastrointestinal status for postoperative course will improve Outcome: Progressing   Problem: Education: Goal: Ability to verbalize activity precautions or restrictions will improve Outcome: Progressing Goal: Knowledge of the prescribed therapeutic regimen will improve Outcome: Progressing Goal: Understanding of discharge needs will improve Outcome: Progressing   Problem: Physical Regulation: Goal: Ability to maintain clinical measurements within normal limits will improve Outcome: Progressing Goal: Postoperative complications will be avoided or minimized Outcome: Progressing Goal: Diagnostic test results will improve Outcome: Progressing   Problem: Pain  Management: Goal: Pain level will decrease Outcome: Progressing   Problem: Skin Integrity: Goal: Signs of wound healing will improve Outcome: Progressing   Problem: Health Behavior/Discharge Planning: Goal: Identification of resources available to assist in meeting health care needs will improve Outcome: Progressing   Problem: Bladder/Genitourinary: Goal: Urinary functional status for postoperative course will improve Outcome: Progressing
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
## 2017-08-26 NOTE — Care Management Important Message (Signed)
Important Message  Patient Details  Name: Curtis Vance MRN: [id number] Date of Birth: 11/17/1943   Medicare Important Message Given:  Yes    Orbie Pyo 08/26/2017, 4:08 PM

## 2017-08-26 NOTE — Progress Notes (Signed)
Dressing changed. Honeycomb dressing with drainage and Tegaderm is nonocclusive. Dressing soiled at bottom of honeycomb.  Gauze and occlusive dressing applied with Charge RN Jarrett Soho.
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
## 2017-08-27 NOTE — Progress Notes (Signed)
Patient ID: Curtis Vance, male   DOB: 11/17/1943, 72 y.o.   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

## 2017-08-27 NOTE — Progress Notes (Signed)
1900: Handoff report received from RN. Pt resting in chair with family visiting. Discussed plan of care for the shift; pt amenable to plan.  0000: Pt resting comfortably.  0400: Pt continues resting comfortably.  0700: Handoff report given to RN. No acute events overnight.

## 2017-08-28 NOTE — Progress Notes (Signed)
Patient ID: ARLANDER GILLEN, male   DOB: 11/17/1943, 72 y.o.   MRN: [id number] BP (!) 170/92 (BP Location: Right Arm)   Pulse 90   Temp 98 F (36.7 C) (Oral)   Resp 16   Ht 5' 7.99" (1.727 m)   Wt 79.3 kg (174 lb 13.2 oz)   SpO2 99%   BMI 26.59 kg/m  Alert and oriented x 4, speech is clear and fluent Wound dressing is dry Moving all extremities To be discharged tomorrow
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
## 2017-08-29 ENCOUNTER — Inpatient Hospital Stay (HOSPITAL_COMMUNITY): Payer: Medicare Other

## 2017-08-29 MED ORDER — MAGIC MOUTHWASH
15.0000 mL | Freq: Four times a day (QID) | ORAL | Status: DC | PRN
  Administered 2017-08-29 – 2017-08-30 (×2): 15 mL via ORAL
  Filled 2017-08-29 (×3): qty 15

## 2017-08-29 MED ORDER — MAGIC MOUTHWASH: 15.0000 mL | Freq: Four times a day (QID) | ORAL | Status: DC | PRN

## 2017-08-29 NOTE — Progress Notes (Signed)
Patient ID: Curtis Vance, male   DOB: 11/17/1943, 72 y.o.   MRN: [id number] Overall, doing okay however increasing swallowing difficulty. Incisions dry improving neurologic function.  We'll check x-ray of his neck and will start Magic mouthwash.
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
## 2017-08-30 LAB — AEROBIC/ANAEROBIC CULTURE W GRAM STAIN (SURGICAL/DEEP WOUND)

## 2017-08-30 LAB — AEROBIC/ANAEROBIC CULTURE (SURGICAL/DEEP WOUND): CULTURE: NO GROWTH

## 2017-08-30 MED ORDER — MAGNESIUM CITRATE PO SOLN
1.0000 | Freq: Once | ORAL | Status: AC
  Administered 2017-08-30: 1 via ORAL
  Filled 2017-08-30: qty 296

## 2017-08-30 MED ORDER — DEXAMETHASONE SODIUM PHOSPHATE 10 MG/ML IJ SOLN
10.0000 mg | Freq: Four times a day (QID) | INTRAMUSCULAR | Status: DC
  Administered 2017-08-30 – 2017-08-31 (×6): 10 mg via INTRAVENOUS
  Filled 2017-08-30 (×6): qty 1
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
## 2017-08-30 NOTE — Progress Notes (Signed)
Patient ID: Curtis Vance, male   DOB: 11/17/1943, 72 y.o.   MRN: [id number] Overall patient doing okay increased right arm radiculitis today probably related to stopping the steroids yesterday. However throat is better with the mouthwash.  Essentially unchanged neurologic exam possibly a little bit increased weakness in his right tricep.   Restart Decadron

## 2017-08-30 NOTE — Progress Notes (Signed)
OT Cancellation Note  Patient Details Name: Curtis Vance MRN: [id number] DOB: 11/17/1943   Cancelled Treatment:    Reason Eval/Treat Not Completed: Pt taking bath and requested OT return another time.  [place], OTR/L [phone]   Lucille Passy M 08/30/2017, 2:21 PM

## 2017-08-31 ENCOUNTER — Other Ambulatory Visit: Payer: Self-pay | Admitting: Family Medicine

## 2017-08-31 ENCOUNTER — Inpatient Hospital Stay (HOSPITAL_COMMUNITY): Payer: Medicare Other

## 2017-08-31 LAB — PROTIME-INR
INR: 1.13
Prothrombin Time: 14.4 seconds (ref 11.4–15.2)

## 2017-08-31 MED ORDER — DEXAMETHASONE SODIUM PHOSPHATE 10 MG/ML IJ SOLN
6.0000 mg | Freq: Four times a day (QID) | INTRAMUSCULAR | Status: DC
  Administered 2017-08-31 – 2017-09-04 (×16): 6 mg via INTRAVENOUS
  Filled 2017-08-31 (×16): qty 1

## 2017-08-31 MED ORDER — LEVOFLOXACIN 500 MG PO TABS
500.0000 mg | ORAL_TABLET | Freq: Every day | ORAL | Status: DC
  Administered 2017-08-31 – 2017-09-04 (×5): 500 mg via ORAL
  Filled 2017-08-31 (×5): qty 1

## 2017-08-31 NOTE — Progress Notes (Signed)
Subjective: Patient reports Patient doing better with improved right arm pain has a dry hacking cough did have one in bandage changed earlier today.  Objective: Vital signs in last 24 hours: Temp:  [97.7 F (36.5 C)-98.8 F (37.1 C)] 97.9 F (36.6 C) (05/08 1544) Pulse Rate:  [66-99] 86 (05/08 1544) Resp:  [15-18] 15 (05/08 0325) BP: (115-144)/(75-84) 115/75 (05/08 1544) SpO2:  [96 %-100 %] 99 % (05/08 1544)  Intake/Output from previous day: 05/07 0701 - 05/08 0700 In: 480 [P.O.:480] Out: 2 [Urine:2] Intake/Output this shift: Total I/O In: 480 [P.O.:480] Out: -   Neurologically baseline incisions are clean dry and intact I removed the dressing  Lab Results: No results for input(s): WBC, HGB, HCT, PLT in the last 72 hours. BMET No results for input(s): NA, K, CL, CO2, GLUCOSE, BUN, CREATININE, CALCIUM in the last 72 hours.  Studies/Results: No results found.  Assessment/Plan: We'll check a portal chest x-ray will start the patient on Levaquin  LOS: 9 days     Copeland Lapier P 08/31/2017, 4:04 PM
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
## 2017-08-31 NOTE — Progress Notes (Signed)
Posterior neck dressing saturated; changed at this time. Continue to monitor.

## 2017-09-01 ENCOUNTER — Inpatient Hospital Stay (HOSPITAL_COMMUNITY): Payer: Medicare Other

## 2017-09-01 MED ORDER — POLYETHYLENE GLYCOL 3350 17 G PO PACK
17.0000 g | PACK | Freq: Two times a day (BID) | ORAL | Status: DC
  Administered 2017-09-01 – 2017-09-04 (×7): 17 g via ORAL
  Filled 2017-09-01 (×7): qty 1

## 2017-09-01 MED ORDER — NALOXEGOL OXALATE 12.5 MG PO TABS
12.5000 mg | ORAL_TABLET | Freq: Every day | ORAL | Status: DC
  Administered 2017-09-01 – 2017-09-04 (×4): 12.5 mg via ORAL
  Filled 2017-09-01 (×4): qty 1

## 2017-09-01 MED ORDER — SENNOSIDES-DOCUSATE SODIUM 8.6-50 MG PO TABS
1.0000 | ORAL_TABLET | Freq: Two times a day (BID) | ORAL | Status: DC
  Administered 2017-09-01 – 2017-09-04 (×7): 1 via ORAL
  Filled 2017-09-01 (×7): qty 1
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
## 2017-09-02 ENCOUNTER — Encounter (HOSPITAL_COMMUNITY): Payer: Self-pay | Admitting: Physician Assistant

## 2017-09-02 DIAGNOSIS — K5641 Fecal impaction: Secondary | ICD-10-CM

## 2017-09-02 DIAGNOSIS — K567 Ileus, unspecified: Secondary | ICD-10-CM

## 2017-09-02 DIAGNOSIS — K581 Irritable bowel syndrome with constipation: Secondary | ICD-10-CM

## 2017-09-02 LAB — T4, FREE: Free T4: 0.73 ng/dL — ABNORMAL LOW (ref 0.82–1.77)

## 2017-09-02 LAB — TSH: TSH: 0.992 u[IU]/mL (ref 0.350–4.500)

## 2017-09-02 MED ORDER — METOCLOPRAMIDE HCL 5 MG/ML IJ SOLN
10.0000 mg | Freq: Four times a day (QID) | INTRAMUSCULAR | Status: AC
  Administered 2017-09-02 – 2017-09-04 (×8): 10 mg via INTRAVENOUS
  Filled 2017-09-02 (×8): qty 2

## 2017-09-02 MED ORDER — HYDROCODONE-ACETAMINOPHEN 5-325 MG PO TABS: 1.0000 | ORAL_TABLET | ORAL | Status: DC | PRN

## 2017-09-02 MED ORDER — ACETAMINOPHEN 325 MG PO TABS
650.0000 mg | ORAL_TABLET | Freq: Four times a day (QID) | ORAL | Status: DC
  Administered 2017-09-02 – 2017-09-04 (×9): 650 mg via ORAL
  Filled 2017-09-02 (×8): qty 2

## 2017-09-02 MED ORDER — BISACODYL 10 MG RE SUPP
10.0000 mg | Freq: Every day | RECTAL | Status: DC
  Administered 2017-09-02: 10 mg via RECTAL
  Filled 2017-09-02 (×2): qty 1
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
## 2017-09-02 NOTE — Progress Notes (Signed)
Noted mod amount of dried drainage on dressing. Dressing changed using gauze and mepilex tape. C-collar placed back per order. Will continue to monitor.
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
## 2017-09-02 NOTE — Progress Notes (Signed)
NEUROSURGERY PROGRESS NOTE  Doing well. Biggest complaint right now is stomach distention and constipation. Patient is passing gas but has not had a bm since admission  Temp:  [97.8 F (36.6 C)-98.3 F (36.8 C)] 97.8 F (36.6 C) (05/10 0805) Pulse Rate:  [75-91] 86 (05/10 0805) Resp:  [14-15] 15 (05/10 0433) BP: (118-158)/(74-98) 158/94 (05/10 0805) SpO2:  [98 %-100 %] 98 % (05/10 0805)  Plan: KUB did show an ileus. We will  Consult GI to help manage.   Eleonore Chiquito, NP 09/02/2017 10:43 AM

## 2017-09-03 MED ORDER — FLEET ENEMA 7-19 GM/118ML RE ENEM
1.0000 | ENEMA | Freq: Once | RECTAL | Status: AC
  Administered 2017-09-03: 1 via RECTAL
  Filled 2017-09-03: qty 1

## 2017-09-03 NOTE — Progress Notes (Signed)
NEUROSURGERY PROGRESS NOTE  Doing well. Pt has had a small bm and is still passing gas Ambulating and voiding well Good strength and sensation Incision CDI  Temp:  [97.8 F (36.6 C)-98.5 F (36.9 C)] 97.9 F (36.6 C) (05/11 0443) Pulse Rate:  [64-102] 64 (05/11 0443) Resp:  [19-21] 19 (05/11 0443) BP: (128-158)/(76-94) 130/86 (05/11 0443) SpO2:  [97 %-100 %] 100 % (05/11 0443)  Plan: Patient does not have a ride home today and would like to go home tomorrow. Will send him home on reglan per GI  Eleonore Chiquito, NP 09/03/2017 5:43 AM

## 2017-09-03 NOTE — Progress Notes (Signed)
1900: Handoff report received from RN. Pt resting in chair. Discussed plan of care for the shift; pt amenable to plan. Pt concerned with consitpation; pt states he usually goes a week at home between BMs r/t a previous spinal cord injury, but "3 weeks is too long." On call provider paged and fleet enema ordered.  0000: Pt resting comfortably.  0400: Pt continues resting comfortably.  0700: Handoff report given to RN. No acute events overnight.
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
## 2017-09-04 MED ORDER — NALOXEGOL OXALATE 12.5 MG PO TABS: 12.5000 mg | ORAL_TABLET | Freq: Every day | ORAL | 0 refills | Status: DC

## 2017-09-04 MED ORDER — OXYCODONE-ACETAMINOPHEN 7.5-325 MG PO TABS: 1.0000 | ORAL_TABLET | Freq: Four times a day (QID) | ORAL | 0 refills | Status: DC | PRN
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
## 2017-09-09 ENCOUNTER — Other Ambulatory Visit: Payer: Self-pay | Admitting: Family Medicine

## 2017-09-12 ENCOUNTER — Other Ambulatory Visit: Payer: Self-pay | Admitting: Family Medicine

## 2017-09-19 ENCOUNTER — Other Ambulatory Visit: Payer: Self-pay | Admitting: Family Medicine

## 2017-09-20 DIAGNOSIS — S129XXA Fracture of neck, unspecified, initial encounter: Secondary | ICD-10-CM | POA: Diagnosis not present

## 2017-09-26 ENCOUNTER — Other Ambulatory Visit: Payer: Self-pay | Admitting: Family Medicine

## 2017-09-28 ENCOUNTER — Ambulatory Visit (INDEPENDENT_AMBULATORY_CARE_PROVIDER_SITE_OTHER): Payer: Medicare Other | Admitting: Endocrinology

## 2017-09-28 ENCOUNTER — Other Ambulatory Visit: Payer: Self-pay | Admitting: Endocrinology

## 2017-09-28 ENCOUNTER — Encounter: Payer: Self-pay | Admitting: Endocrinology

## 2017-09-28 VITALS — BP 142/78 | HR 99 | Wt 169.0 lb

## 2017-09-28 DIAGNOSIS — E0590 Thyrotoxicosis, unspecified without thyrotoxic crisis or storm: Secondary | ICD-10-CM

## 2017-09-28 LAB — TSH: TSH: 17.75 u[IU]/mL — AB (ref 0.35–4.50)

## 2017-09-28 LAB — T4, FREE: FREE T4: 1.05 ng/dL (ref 0.60–1.60)

## 2017-09-28 MED ORDER — LEVOTHYROXINE SODIUM 125 MCG PO TABS: 125.0000 ug | ORAL_TABLET | Freq: Every day | ORAL | 5 refills | Status: DC

## 2017-09-28 NOTE — Patient Instructions (Addendum)
Your blood pressure is high today.  Please see Dr Ronnald Ramp soon, to have it rechecked.   blood tests are requested for you today.  We'll let you know about the results.   If ever you have fever while taking methimazole, stop it and call us, even if the reason is obvious, because of the risk of a rare side-effect.   Please come back for a follow-up appointment in 3 months.
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
## 2017-10-03 ENCOUNTER — Other Ambulatory Visit: Payer: Self-pay | Admitting: Internal Medicine

## 2017-10-03 DIAGNOSIS — E785 Hyperlipidemia, unspecified: Secondary | ICD-10-CM

## 2017-10-06 DIAGNOSIS — S129XXA Fracture of neck, unspecified, initial encounter: Secondary | ICD-10-CM | POA: Diagnosis not present

## 2017-10-11 ENCOUNTER — Other Ambulatory Visit: Payer: Self-pay | Admitting: Neurosurgery

## 2017-10-11 ENCOUNTER — Telehealth: Payer: Self-pay | Admitting: Emergency Medicine

## 2017-10-11 DIAGNOSIS — S129XXA Fracture of neck, unspecified, initial encounter: Secondary | ICD-10-CM

## 2017-10-11 NOTE — Telephone Encounter (Signed)
Called patient to schedule AWV. Patient declined at this time. 

## 2017-10-26 ENCOUNTER — Ambulatory Visit
Admission: RE | Admit: 2017-10-26 | Discharge: 2017-10-26 | Disposition: A | Discharge status: Home / Self Care | Payer: Medicare Other | Source: Ambulatory Visit | Attending: Neurosurgery | Admitting: Neurosurgery

## 2017-10-26 DIAGNOSIS — M8448XA Pathological fracture, other site, initial encounter for fracture: Secondary | ICD-10-CM | POA: Diagnosis not present

## 2017-10-26 DIAGNOSIS — S129XXA Fracture of neck, unspecified, initial encounter: Secondary | ICD-10-CM

## 2017-11-03 DIAGNOSIS — M542 Cervicalgia: Secondary | ICD-10-CM | POA: Diagnosis not present

## 2017-11-27 ENCOUNTER — Other Ambulatory Visit: Payer: Self-pay | Admitting: Internal Medicine

## 2017-11-27 DIAGNOSIS — I10 Essential (primary) hypertension: Secondary | ICD-10-CM

## 2017-12-02 ENCOUNTER — Ambulatory Visit: Payer: Medicare Other | Admitting: Neurology

## 2017-12-06 DIAGNOSIS — M542 Cervicalgia: Secondary | ICD-10-CM | POA: Diagnosis not present

## 2017-12-06 DIAGNOSIS — S129XXA Fracture of neck, unspecified, initial encounter: Secondary | ICD-10-CM | POA: Diagnosis not present

## 2017-12-06 DIAGNOSIS — R030 Elevated blood-pressure reading, without diagnosis of hypertension: Secondary | ICD-10-CM | POA: Diagnosis not present

## 2017-12-24 ENCOUNTER — Other Ambulatory Visit: Payer: Self-pay | Admitting: Endocrinology

## 2017-12-28 ENCOUNTER — Encounter: Payer: Self-pay | Admitting: Endocrinology

## 2017-12-28 ENCOUNTER — Telehealth: Payer: Self-pay | Admitting: Endocrinology

## 2017-12-28 ENCOUNTER — Ambulatory Visit (INDEPENDENT_AMBULATORY_CARE_PROVIDER_SITE_OTHER): Payer: Medicare Other | Admitting: Endocrinology

## 2017-12-28 DIAGNOSIS — E890 Postprocedural hypothyroidism: Secondary | ICD-10-CM | POA: Diagnosis not present

## 2017-12-28 DIAGNOSIS — E039 Hypothyroidism, unspecified: Secondary | ICD-10-CM | POA: Insufficient documentation

## 2017-12-28 LAB — T4, FREE: FREE T4: 1.28 ng/dL (ref 0.60–1.60)

## 2017-12-28 LAB — TSH: TSH: 0.09 u[IU]/mL — AB (ref 0.35–4.50)

## 2017-12-28 MED ORDER — LEVOTHYROXINE SODIUM 100 MCG PO TABS
100.0000 ug | ORAL_TABLET | Freq: Every day | ORAL | 1 refills | Status: DC
Start: 2017-12-28 — End: 2018-01-17
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
## 2017-12-28 NOTE — Patient Instructions (Addendum)
blood tests are requested for you today.  We'll let you know about the results.   Please come back for a follow-up appointment in 3 months.   

## 2017-12-28 NOTE — Telephone Encounter (Signed)
please call patient: Please disregard the my chart message.  The correct message is: We need to decrease the thyroid pill.  I have sent a prescription to your pharmacy.  I'll see you next time.

## 2017-12-29 ENCOUNTER — Telehealth: Payer: Self-pay | Admitting: Endocrinology

## 2017-12-29 NOTE — Telephone Encounter (Signed)
No, the result note said the my chart message was an error.  Please reduce the levothyroxine, and stay off the methimazole.

## 2017-12-29 NOTE — Telephone Encounter (Signed)
Patient's wife Curtis Vance called re: clarification regarding patient's medications. Dr. Loanne Drilling told patient yesterday in MyChart to increase methimazole he is already taking levothyroxine 120 in the am. Patient had taken and stopped taking the methimazole before he started taking the Levothyroxine. Glenda needs to whether patient should be taking both medications or just one. Please call ph#  [phone] to advise/clarify.

## 2017-12-29 NOTE — Telephone Encounter (Signed)
Please advise 

## 2018-01-02 NOTE — Telephone Encounter (Signed)
Pt wife aware

## 2018-01-03 DIAGNOSIS — S129XXA Fracture of neck, unspecified, initial encounter: Secondary | ICD-10-CM | POA: Diagnosis not present

## 2018-01-03 DIAGNOSIS — R030 Elevated blood-pressure reading, without diagnosis of hypertension: Secondary | ICD-10-CM | POA: Diagnosis not present

## 2018-01-06 ENCOUNTER — Other Ambulatory Visit: Payer: Self-pay | Admitting: Endocrinology

## 2018-01-11 ENCOUNTER — Ambulatory Visit: Payer: Medicare Other | Admitting: Internal Medicine

## 2018-01-11 DIAGNOSIS — M542 Cervicalgia: Secondary | ICD-10-CM | POA: Diagnosis not present

## 2018-01-17 ENCOUNTER — Other Ambulatory Visit: Payer: Self-pay | Admitting: Internal Medicine

## 2018-01-17 ENCOUNTER — Encounter: Payer: Self-pay | Admitting: Family Medicine

## 2018-01-17 ENCOUNTER — Ambulatory Visit (INDEPENDENT_AMBULATORY_CARE_PROVIDER_SITE_OTHER): Payer: Medicare Other | Admitting: Family Medicine

## 2018-01-17 DIAGNOSIS — G959 Disease of spinal cord, unspecified: Secondary | ICD-10-CM

## 2018-01-17 MED ORDER — GABAPENTIN 100 MG PO CAPS: 200.0000 mg | ORAL_CAPSULE | Freq: Every day | ORAL | 3 refills | Status: DC

## 2018-01-17 NOTE — Assessment & Plan Note (Signed)
Patient does have more muscle soreness.  This could be secondary to the post ablation hypothyroidism but continues to have a low TSH.  I believe the patient continues to have some atrophy of the muscles.  We discussed over the medications that could be beneficial, gabapentin given, discussed compression, protein supplementations and well hydration.  Home exercises given for eccentric strengthening.  Follow-up again in 4 weeks.  Spent  25 minutes with patient face-to-face and had greater than 50% of counseling including as described above in assessment and plan.
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
## 2018-01-17 NOTE — Patient Instructions (Addendum)
Good to see you  You will do well  Gabapentin 200mg  at night  CoQ10 200 mg daily  Vega sport or whey protein 20 grams daily to help with getting the muscle bigger See me again in 6 weeks

## 2018-01-31 ENCOUNTER — Ambulatory Visit: Payer: Medicare Other | Admitting: Internal Medicine

## 2018-02-08 ENCOUNTER — Other Ambulatory Visit: Payer: Self-pay | Admitting: Family Medicine

## 2018-02-08 NOTE — Telephone Encounter (Signed)
Refill done.  

## 2018-02-13 DIAGNOSIS — M542 Cervicalgia: Secondary | ICD-10-CM | POA: Diagnosis not present

## 2018-02-20 ENCOUNTER — Other Ambulatory Visit (INDEPENDENT_AMBULATORY_CARE_PROVIDER_SITE_OTHER): Payer: Medicare Other

## 2018-02-20 ENCOUNTER — Encounter: Payer: Self-pay | Admitting: Internal Medicine

## 2018-02-20 ENCOUNTER — Ambulatory Visit (INDEPENDENT_AMBULATORY_CARE_PROVIDER_SITE_OTHER): Payer: Medicare Other | Admitting: Internal Medicine

## 2018-02-20 VITALS — BP 130/72 | HR 87 | Temp 98.0°F | Resp 16 | Ht 67.0 in | Wt 159.0 lb

## 2018-02-20 DIAGNOSIS — I10 Essential (primary) hypertension: Secondary | ICD-10-CM | POA: Diagnosis not present

## 2018-02-20 DIAGNOSIS — D539 Nutritional anemia, unspecified: Secondary | ICD-10-CM | POA: Diagnosis not present

## 2018-02-20 DIAGNOSIS — E785 Hyperlipidemia, unspecified: Secondary | ICD-10-CM | POA: Diagnosis not present

## 2018-02-20 DIAGNOSIS — E039 Hypothyroidism, unspecified: Secondary | ICD-10-CM

## 2018-02-20 DIAGNOSIS — Z0000 Encounter for general adult medical examination without abnormal findings: Secondary | ICD-10-CM

## 2018-02-20 DIAGNOSIS — Z23 Encounter for immunization: Secondary | ICD-10-CM

## 2018-02-20 DIAGNOSIS — R972 Elevated prostate specific antigen [PSA]: Secondary | ICD-10-CM

## 2018-02-20 LAB — CBC WITH DIFFERENTIAL/PLATELET
BASOS PCT: 1.6 % (ref 0.0–3.0)
Basophils Absolute: 0.1 10*3/uL (ref 0.0–0.1)
EOS ABS: 0.2 10*3/uL (ref 0.0–0.7)
EOS PCT: 2.6 % (ref 0.0–5.0)
HEMATOCRIT: 37.8 % — AB (ref 39.0–52.0)
HEMOGLOBIN: 12.6 g/dL — AB (ref 13.0–17.0)
LYMPHS PCT: 23.6 % (ref 12.0–46.0)
Lymphs Abs: 1.5 10*3/uL (ref 0.7–4.0)
MCHC: 33.3 g/dL (ref 30.0–36.0)
MCV: 92.1 fl (ref 78.0–100.0)
MONOS PCT: 8.2 % (ref 3.0–12.0)
Monocytes Absolute: 0.5 10*3/uL (ref 0.1–1.0)
NEUTROS ABS: 4 10*3/uL (ref 1.4–7.7)
NEUTROS PCT: 64 % (ref 43.0–77.0)
Platelets: 419 10*3/uL — ABNORMAL HIGH (ref 150.0–400.0)
RBC: 4.11 Mil/uL — ABNORMAL LOW (ref 4.22–5.81)
RDW: 16.9 % — ABNORMAL HIGH (ref 11.5–15.5)
WBC: 6.3 10*3/uL (ref 4.0–10.5)

## 2018-02-20 LAB — LIPID PANEL
CHOLESTEROL: 183 mg/dL (ref 0–200)
HDL: 52.7 mg/dL (ref >39.00)
LDL Cholesterol: 111 mg/dL — ABNORMAL HIGH (ref 0–99)
NonHDL: 130.61
Total CHOL/HDL Ratio: 3
Triglycerides: 98 mg/dL (ref 0.0–149.0)
VLDL: 19.6 mg/dL (ref 0.0–40.0)

## 2018-02-20 LAB — COMPREHENSIVE METABOLIC PANEL
ALBUMIN: 4.1 g/dL (ref 3.5–5.2)
ALT: 14 U/L (ref 0–53)
AST: 23 U/L (ref 0–37)
Alkaline Phosphatase: 79 U/L (ref 39–117)
BUN: 16 mg/dL (ref 6–23)
CHLORIDE: 102 meq/L (ref 96–112)
CO2: 28 meq/L (ref 19–32)
CREATININE: 0.91 mg/dL (ref 0.40–1.50)
Calcium: 9.7 mg/dL (ref 8.4–10.5)
GFR: 104.1 mL/min (ref >60.00)
GLUCOSE: 81 mg/dL (ref 70–99)
POTASSIUM: 4.4 meq/L (ref 3.5–5.1)
SODIUM: 137 meq/L (ref 135–145)
Total Bilirubin: 0.6 mg/dL (ref 0.2–1.2)
Total Protein: 7.2 g/dL (ref 6.0–8.3)

## 2018-02-20 LAB — IBC PANEL
IRON: 48 ug/dL (ref 42–165)
SATURATION RATIOS: 16.6 % — AB (ref 20.0–50.0)
Transferrin: 207 mg/dL — ABNORMAL LOW (ref 212.0–360.0)

## 2018-02-20 LAB — TSH: TSH: 6.54 u[IU]/mL — ABNORMAL HIGH (ref 0.35–4.50)

## 2018-02-20 LAB — URINALYSIS, ROUTINE W REFLEX MICROSCOPIC
Bilirubin Urine: NEGATIVE
Hgb urine dipstick: NEGATIVE
Ketones, ur: NEGATIVE
Nitrite: NEGATIVE
PH: 6 (ref 5.0–8.0)
Specific Gravity, Urine: 1.01 (ref 1.000–1.030)
TOTAL PROTEIN, URINE-UPE24: NEGATIVE
URINE GLUCOSE: NEGATIVE
UROBILINOGEN UA: 0.2 (ref 0.0–1.0)

## 2018-02-20 LAB — PSA: PSA: 6.3

## 2018-02-20 LAB — VITAMIN B12: Vitamin B-12: 1105 pg/mL — ABNORMAL HIGH (ref 211–911)

## 2018-02-20 LAB — VITAMIN D 25 HYDROXY (VIT D DEFICIENCY, FRACTURES): VITD: 57.6 ng/mL (ref 30.00–100.00)

## 2018-02-20 LAB — FOLATE: FOLATE: 20.3 ng/mL (ref >5.9)

## 2018-02-20 NOTE — Patient Instructions (Signed)
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
## 2018-02-21 ENCOUNTER — Encounter: Payer: Self-pay | Admitting: Internal Medicine

## 2018-02-21 MED ORDER — FERROUS SULFATE 325 (65 FE) MG PO TABS: 325.0000 mg | ORAL_TABLET | Freq: Every day | ORAL | 1 refills | Status: DC

## 2018-02-21 NOTE — Assessment & Plan Note (Signed)

## 2018-02-22 DIAGNOSIS — M542 Cervicalgia: Secondary | ICD-10-CM | POA: Diagnosis not present

## 2018-02-25 LAB — VITAMIN B1: VITAMIN B1 (THIAMINE): 13 nmol/L (ref 8–30)

## 2018-02-25 LAB — PSA, TOTAL AND FREE
PSA, % Free: 22 % (calc) — ABNORMAL LOW (ref >25)
PSA, FREE: 1.4 ng/mL
PSA, Total: 6.3 ng/mL — ABNORMAL HIGH (ref <=4.0)
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
## 2018-02-28 ENCOUNTER — Ambulatory Visit: Payer: Self-pay

## 2018-02-28 ENCOUNTER — Ambulatory Visit (INDEPENDENT_AMBULATORY_CARE_PROVIDER_SITE_OTHER): Payer: Medicare Other | Admitting: Family Medicine

## 2018-02-28 ENCOUNTER — Encounter: Payer: Self-pay | Admitting: Family Medicine

## 2018-02-28 VITALS — BP 124/76 | HR 94 | Ht 67.0 in | Wt 157.0 lb

## 2018-02-28 DIAGNOSIS — M1611 Unilateral primary osteoarthritis, right hip: Secondary | ICD-10-CM | POA: Diagnosis not present

## 2018-02-28 DIAGNOSIS — M25551 Pain in right hip: Secondary | ICD-10-CM | POA: Diagnosis not present

## 2018-02-28 NOTE — Assessment & Plan Note (Signed)
Patient had repeat injection again today.  Tolerated the procedure well.  I do not believe that this will continue to work though.  Patient does have severe arthritic changes and does not need to seek the potential for a replacement.  Referred today.  Discussed icing regimen.  If he wants to continue conservative therapy we can attempt the injections every 3 to 4 months.  Spent  25 minutes with patient face-to-face and had greater than 50% of counseling including as described above in assessment and plan.

## 2018-02-28 NOTE — Patient Instructions (Addendum)
Good to see you  Ice is your friend Will refer to another endocrinologist  Also refer you to Dr. Rhona Raider for a hip replacement and to discuss with him  If injection helps can do it every 3 months but think you should consider replacement You know where I am if you need me

## 2018-03-02 ENCOUNTER — Other Ambulatory Visit: Payer: Self-pay

## 2018-03-02 ENCOUNTER — Other Ambulatory Visit: Payer: Self-pay | Admitting: Internal Medicine

## 2018-03-02 DIAGNOSIS — S129XXA Fracture of neck, unspecified, initial encounter: Secondary | ICD-10-CM | POA: Diagnosis not present

## 2018-03-02 DIAGNOSIS — G959 Disease of spinal cord, unspecified: Secondary | ICD-10-CM

## 2018-03-02 MED ORDER — METHOCARBAMOL 750 MG PO TABS: 750.0000 mg | ORAL_TABLET | Freq: Three times a day (TID) | ORAL | 3 refills | Status: DC | PRN

## 2018-03-08 DIAGNOSIS — M1611 Unilateral primary osteoarthritis, right hip: Secondary | ICD-10-CM | POA: Diagnosis not present

## 2018-03-09 DIAGNOSIS — M542 Cervicalgia: Secondary | ICD-10-CM | POA: Diagnosis not present

## 2018-03-10 ENCOUNTER — Other Ambulatory Visit: Payer: Self-pay | Admitting: Neurosurgery

## 2018-03-10 DIAGNOSIS — S129XXA Fracture of neck, unspecified, initial encounter: Secondary | ICD-10-CM

## 2018-03-15 ENCOUNTER — Ambulatory Visit
Admission: RE | Admit: 2018-03-15 | Discharge: 2018-03-15 | Disposition: A | Discharge status: Home / Self Care | Payer: Medicare Other | Source: Ambulatory Visit | Attending: Neurosurgery | Admitting: Neurosurgery

## 2018-03-15 DIAGNOSIS — S12000A Unspecified displaced fracture of first cervical vertebra, initial encounter for closed fracture: Secondary | ICD-10-CM | POA: Diagnosis not present

## 2018-03-15 DIAGNOSIS — S129XXA Fracture of neck, unspecified, initial encounter: Secondary | ICD-10-CM

## 2018-03-21 DIAGNOSIS — M542 Cervicalgia: Secondary | ICD-10-CM | POA: Diagnosis not present

## 2018-03-28 DIAGNOSIS — M542 Cervicalgia: Secondary | ICD-10-CM | POA: Diagnosis not present

## 2018-03-30 ENCOUNTER — Encounter: Payer: Self-pay | Admitting: Endocrinology

## 2018-03-30 ENCOUNTER — Ambulatory Visit (INDEPENDENT_AMBULATORY_CARE_PROVIDER_SITE_OTHER): Payer: Medicare Other | Admitting: Endocrinology

## 2018-03-30 VITALS — BP 130/72 | HR 84 | Ht 67.0 in | Wt 157.2 lb

## 2018-03-30 DIAGNOSIS — E890 Postprocedural hypothyroidism: Secondary | ICD-10-CM | POA: Diagnosis not present
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
## 2018-03-30 NOTE — Patient Instructions (Addendum)
blood tests are requested for you today.  We'll let you know about the results.   Please come back for a follow-up appointment in 6 weeks.  Please bring all of your medication bottles when you come back.

## 2018-03-31 LAB — TSH: TSH: 9.07 u[IU]/mL — ABNORMAL HIGH (ref 0.35–4.50)

## 2018-03-31 LAB — T4, FREE: Free T4: 0.84 ng/dL (ref 0.60–1.60)

## 2018-04-03 ENCOUNTER — Other Ambulatory Visit: Payer: Self-pay

## 2018-04-04 ENCOUNTER — Other Ambulatory Visit: Payer: Self-pay | Admitting: Endocrinology

## 2018-04-04 MED ORDER — LEVOTHYROXINE SODIUM 112 MCG PO TABS: 112.0000 ug | ORAL_TABLET | Freq: Every day | ORAL | 11 refills | Status: DC

## 2018-04-11 ENCOUNTER — Other Ambulatory Visit: Payer: Self-pay | Admitting: Internal Medicine

## 2018-04-11 DIAGNOSIS — M542 Cervicalgia: Secondary | ICD-10-CM | POA: Diagnosis not present

## 2018-04-11 DIAGNOSIS — E785 Hyperlipidemia, unspecified: Secondary | ICD-10-CM

## 2018-04-12 ENCOUNTER — Other Ambulatory Visit: Payer: Self-pay | Admitting: Internal Medicine

## 2018-04-12 DIAGNOSIS — I10 Essential (primary) hypertension: Secondary | ICD-10-CM

## 2018-04-26 HISTORY — PX: CATARACT EXTRACTION, BILATERAL: SHX1313

## 2018-05-16 ENCOUNTER — Encounter: Payer: Self-pay | Admitting: Endocrinology

## 2018-05-16 ENCOUNTER — Ambulatory Visit (INDEPENDENT_AMBULATORY_CARE_PROVIDER_SITE_OTHER): Payer: Medicare Other | Admitting: Endocrinology

## 2018-05-16 VITALS — BP 160/88 | HR 92 | Ht 67.0 in | Wt 161.8 lb

## 2018-05-16 DIAGNOSIS — E890 Postprocedural hypothyroidism: Secondary | ICD-10-CM

## 2018-05-16 LAB — TSH: TSH: 2.22 u[IU]/mL (ref 0.35–4.50)

## 2018-05-16 LAB — T4, FREE: Free T4: 1.13 ng/dL (ref 0.60–1.60)
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
## 2018-05-16 NOTE — Patient Instructions (Signed)
blood tests are requested for you today.  We'll let you know about the results.   Please come back for a follow-up appointment in 3 months.  Please bring all of your medication bottles when you come back.

## 2018-05-18 DIAGNOSIS — M542 Cervicalgia: Secondary | ICD-10-CM | POA: Diagnosis not present

## 2018-06-05 DIAGNOSIS — H25041 Posterior subcapsular polar age-related cataract, right eye: Secondary | ICD-10-CM | POA: Diagnosis not present

## 2018-06-05 DIAGNOSIS — H25011 Cortical age-related cataract, right eye: Secondary | ICD-10-CM | POA: Diagnosis not present

## 2018-06-22 DIAGNOSIS — M542 Cervicalgia: Secondary | ICD-10-CM | POA: Diagnosis not present

## 2018-06-22 DIAGNOSIS — R030 Elevated blood-pressure reading, without diagnosis of hypertension: Secondary | ICD-10-CM | POA: Diagnosis not present

## 2018-06-26 DIAGNOSIS — H2511 Age-related nuclear cataract, right eye: Secondary | ICD-10-CM | POA: Diagnosis not present

## 2018-06-26 DIAGNOSIS — H40013 Open angle with borderline findings, low risk, bilateral: Secondary | ICD-10-CM | POA: Diagnosis not present

## 2018-06-26 DIAGNOSIS — I10 Essential (primary) hypertension: Secondary | ICD-10-CM | POA: Diagnosis not present

## 2018-08-15 ENCOUNTER — Ambulatory Visit: Payer: Medicare Other | Admitting: Endocrinology

## 2018-08-15 ENCOUNTER — Other Ambulatory Visit: Payer: Self-pay

## 2018-08-15 ENCOUNTER — Ambulatory Visit (INDEPENDENT_AMBULATORY_CARE_PROVIDER_SITE_OTHER): Payer: Medicare Other | Admitting: Endocrinology

## 2018-08-15 ENCOUNTER — Encounter: Payer: Self-pay | Admitting: Endocrinology

## 2018-08-15 DIAGNOSIS — E890 Postprocedural hypothyroidism: Secondary | ICD-10-CM
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
## 2018-08-22 ENCOUNTER — Ambulatory Visit: Payer: Medicare Other | Admitting: Internal Medicine

## 2018-09-11 ENCOUNTER — Other Ambulatory Visit: Payer: Self-pay | Admitting: Internal Medicine

## 2018-09-11 DIAGNOSIS — I10 Essential (primary) hypertension: Secondary | ICD-10-CM

## 2018-09-27 DIAGNOSIS — M1611 Unilateral primary osteoarthritis, right hip: Secondary | ICD-10-CM | POA: Diagnosis not present

## 2018-09-27 DIAGNOSIS — M160 Bilateral primary osteoarthritis of hip: Secondary | ICD-10-CM | POA: Diagnosis not present

## 2018-10-04 ENCOUNTER — Other Ambulatory Visit: Payer: Self-pay

## 2018-10-04 DIAGNOSIS — M1612 Unilateral primary osteoarthritis, left hip: Secondary | ICD-10-CM | POA: Diagnosis not present

## 2018-10-04 DIAGNOSIS — M25552 Pain in left hip: Secondary | ICD-10-CM | POA: Diagnosis not present

## 2018-10-06 ENCOUNTER — Encounter: Payer: Self-pay | Admitting: Endocrinology

## 2018-10-06 ENCOUNTER — Other Ambulatory Visit: Payer: Self-pay

## 2018-10-06 ENCOUNTER — Ambulatory Visit (INDEPENDENT_AMBULATORY_CARE_PROVIDER_SITE_OTHER): Payer: Medicare Other | Admitting: Endocrinology

## 2018-10-06 VITALS — BP 158/80 | HR 97 | Temp 98.0°F | Wt 164.0 lb

## 2018-10-06 DIAGNOSIS — E890 Postprocedural hypothyroidism: Secondary | ICD-10-CM | POA: Diagnosis not present

## 2018-10-06 LAB — TSH: TSH: 0.08 u[IU]/mL — ABNORMAL LOW (ref 0.35–4.50)

## 2018-10-06 LAB — T4, FREE: Free T4: 1.24 ng/dL (ref 0.60–1.60)

## 2018-10-06 MED ORDER — LEVOTHYROXINE SODIUM 100 MCG PO TABS: 100.0000 ug | ORAL_TABLET | Freq: Every day | ORAL | 3 refills | Status: DC
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
## 2018-10-06 NOTE — Patient Instructions (Addendum)
Your blood pressure is high today.  Please see your primary care provider soon, to have it rechecked. Thyroid blood tests are requested for you today.  We'll let you know about the results.   Please come back for a follow-up appointment in 6 months.  Please bring all of your medication bottles when you come back.

## 2018-10-23 DIAGNOSIS — S129XXA Fracture of neck, unspecified, initial encounter: Secondary | ICD-10-CM | POA: Diagnosis not present

## 2018-10-23 DIAGNOSIS — M24541 Contracture, right hand: Secondary | ICD-10-CM | POA: Diagnosis not present

## 2018-10-25 ENCOUNTER — Ambulatory Visit (INDEPENDENT_AMBULATORY_CARE_PROVIDER_SITE_OTHER): Payer: Medicare Other | Admitting: Internal Medicine

## 2018-10-25 ENCOUNTER — Other Ambulatory Visit (INDEPENDENT_AMBULATORY_CARE_PROVIDER_SITE_OTHER): Payer: Medicare Other

## 2018-10-25 ENCOUNTER — Encounter: Payer: Self-pay | Admitting: Internal Medicine

## 2018-10-25 ENCOUNTER — Other Ambulatory Visit: Payer: Self-pay

## 2018-10-25 VITALS — BP 160/92 | HR 70 | Temp 97.8°F | Resp 16 | Ht 67.0 in | Wt 163.8 lb

## 2018-10-25 DIAGNOSIS — R972 Elevated prostate specific antigen [PSA]: Secondary | ICD-10-CM | POA: Diagnosis not present

## 2018-10-25 DIAGNOSIS — I10 Essential (primary) hypertension: Secondary | ICD-10-CM

## 2018-10-25 DIAGNOSIS — R64 Cachexia: Secondary | ICD-10-CM

## 2018-10-25 DIAGNOSIS — M1611 Unilateral primary osteoarthritis, right hip: Secondary | ICD-10-CM | POA: Diagnosis not present

## 2018-10-25 DIAGNOSIS — R040 Epistaxis: Secondary | ICD-10-CM | POA: Diagnosis not present

## 2018-10-25 DIAGNOSIS — D539 Nutritional anemia, unspecified: Secondary | ICD-10-CM

## 2018-10-25 DIAGNOSIS — J449 Chronic obstructive pulmonary disease, unspecified: Secondary | ICD-10-CM

## 2018-10-25 LAB — CBC WITH DIFFERENTIAL/PLATELET
Basophils Absolute: 0 10*3/uL (ref 0.0–0.1)
Basophils Relative: 0.6 % (ref 0.0–3.0)
Eosinophils Absolute: 0 10*3/uL (ref 0.0–0.7)
Eosinophils Relative: 0.7 % (ref 0.0–5.0)
HCT: 41.5 % (ref 39.0–52.0)
Hemoglobin: 13.8 g/dL (ref 13.0–17.0)
Lymphocytes Relative: 23.4 % (ref 12.0–46.0)
Lymphs Abs: 1.4 10*3/uL (ref 0.7–4.0)
MCHC: 33.2 g/dL (ref 30.0–36.0)
MCV: 97.7 fl (ref 78.0–100.0)
Monocytes Absolute: 0.7 10*3/uL (ref 0.1–1.0)
Monocytes Relative: 11 % (ref 3.0–12.0)
Neutro Abs: 3.9 10*3/uL (ref 1.4–7.7)
Neutrophils Relative %: 64.3 % (ref 43.0–77.0)
Platelets: 235 10*3/uL (ref 150.0–400.0)
RBC: 4.25 Mil/uL (ref 4.22–5.81)
RDW: 14.8 % (ref 11.5–15.5)
WBC: 6.1 10*3/uL (ref 4.0–10.5)

## 2018-10-25 LAB — URINALYSIS, ROUTINE W REFLEX MICROSCOPIC
Bilirubin Urine: NEGATIVE
Hgb urine dipstick: NEGATIVE
Ketones, ur: NEGATIVE
Leukocytes,Ua: NEGATIVE
Nitrite: NEGATIVE
RBC / HPF: NONE SEEN (ref 0–?)
Specific Gravity, Urine: >=1.03 — AB (ref 1.000–1.030)
Urine Glucose: NEGATIVE
Urobilinogen, UA: 0.2 (ref 0.0–1.0)
pH: 5.5 (ref 5.0–8.0)

## 2018-10-25 LAB — BASIC METABOLIC PANEL
BUN: 12 mg/dL (ref 6–23)
CO2: 26 mEq/L (ref 19–32)
Calcium: 9.2 mg/dL (ref 8.4–10.5)
Chloride: 104 mEq/L (ref 96–112)
Creatinine, Ser: 0.97 mg/dL (ref 0.40–1.50)
GFR: 90.83 mL/min (ref >60.00)
Glucose, Bld: 88 mg/dL (ref 70–99)
Potassium: 4.3 mEq/L (ref 3.5–5.1)
Sodium: 139 mEq/L (ref 135–145)

## 2018-10-25 LAB — VITAMIN D 25 HYDROXY (VIT D DEFICIENCY, FRACTURES): VITD: 54.22 ng/mL (ref 30.00–100.00)

## 2018-10-25 LAB — FERRITIN: Ferritin: 58.2 ng/mL (ref 22.0–322.0)

## 2018-10-25 LAB — FOLATE: Folate: >23.9 ng/mL (ref >5.9)

## 2018-10-25 LAB — IBC PANEL
Iron: 93 ug/dL (ref 42–165)
Saturation Ratios: 31.8 % (ref 20.0–50.0)
Transferrin: 209 mg/dL — ABNORMAL LOW (ref 212.0–360.0)

## 2018-10-25 LAB — VITAMIN B12: Vitamin B-12: 885 pg/mL (ref 211–911)

## 2018-10-25 MED ORDER — CHLORTHALIDONE 25 MG PO TABS: 25.0000 mg | ORAL_TABLET | Freq: Every day | ORAL | 0 refills | Status: DC

## 2018-10-25 MED ORDER — TRAMADOL HCL 50 MG PO TABS: 50.0000 mg | ORAL_TABLET | Freq: Three times a day (TID) | ORAL | 5 refills | Status: AC | PRN

## 2018-10-25 NOTE — Patient Instructions (Signed)

## 2018-10-25 NOTE — Progress Notes (Signed)
Subjective:  Patient ID: Curtis Vance, male    DOB: 11/17/1943  Age: 73 y.o. MRN: [id number]  CC: Hypertension and Anemia   HPI Curtis Vance presents for f/up -  1.  He complains that his blood pressure is not been well controlled.  He tells me he is using the clonidine but according to prescription refills he is no longer taking chlorthalidone.  2.  He complains of a several month history of epistaxis on the left side.  He denies facial pain, purulent nasal phlegm, postnasal drip, fever, or chills.  3.  He complains of chronic right hip pain and tells me that he is going to have orthopedic surgery within the next few months.  He tells me the pain interferes with his sleep and his daily activities.  Outpatient Medications Prior to Visit  Medication Sig Dispense Refill   albuterol (PROAIR HFA) 108 (90 Base) MCG/ACT inhaler Inhale 2 puffs into the lungs every 6 (six) hours as needed for wheezing or shortness of breath. 18 g 0   Ascorbic Acid (VITAMIN C) 1000 MG tablet Take 1,000 mg by mouth daily.     aspirin 81 MG tablet Take 81 mg by mouth daily.     Cholecalciferol (VITAMIN D) 2000 units CAPS Take 2,000 Units by mouth daily.      cloNIDine (CATAPRES - DOSED IN MG/24 HR) 0.3 mg/24hr patch PLACE 1 PATCH ONCE WEEKLY ONTO THE SKIN 12 patch 1   cloNIDine (CATAPRES) 0.1 MG tablet TAKE 1 TABLET (0.1 MG TOTAL) BY MOUTH 3 (THREE) TIMES DAILY. 270 tablet 1   ferrous sulfate 325 (65 FE) MG tablet Take 1 tablet (325 mg total) by mouth daily with breakfast. 90 tablet 1   fexofenadine (ALLEGRA) 180 MG tablet Take 180 mg by mouth daily.     gabapentin (NEURONTIN) 100 MG capsule TAKE 2 CAPSULES (200 MG TOTAL) BY MOUTH AT BEDTIME. 180 capsule 2   Glycopyrrolate (LONHALA MAGNAIR REFILL KIT) 25 MCG/ML SOLN Inhale 1 Act into the lungs 2 (two) times daily. 60 mL 11   guaiFENesin (MUCINEX) 600 MG 12 hr tablet Take 600 mg by mouth daily.     KLOR-CON M20 20 MEQ tablet TAKE 1 TABLET (20  MEQ TOTAL) BY MOUTH DAILY. 90 tablet 1   levothyroxine (SYNTHROID) 100 MCG tablet Take 1 tablet (100 mcg total) by mouth daily before breakfast. 90 tablet 3   LONHALA MAGNAIR STARTER KIT 25 MCG/ML SOLN Use one vial via Magnair device twice a day. Generic: Glycopyrrolate 60 mL 11   methocarbamol (ROBAXIN-750) 750 MG tablet Take 1 tablet (750 mg total) by mouth 3 (three) times daily as needed for muscle spasms. 90 tablet 3   rosuvastatin (CRESTOR) 10 MG tablet TAKE 1 TABLET BY MOUTH EVERY DAY 90 tablet 1   SYMBICORT 160-4.5 MCG/ACT inhaler INHALE 2 PUFFS INTO THE LUNGS EVERY 12 HOURS AS NEEDED 30.6 Inhaler 1   Turmeric 500 MG TABS Take 500 mg by mouth daily.     chlorthalidone (HYGROTON) 25 MG tablet Take 1 tablet (25 mg total) by mouth daily. 90 tablet 0   meloxicam (MOBIC) 7.5 MG tablet Take 1 tablet (7.5 mg total) by mouth daily as needed. 90 tablet 0   No facility-administered medications prior to visit.     ROS Review of Systems  Constitutional: Negative for chills, diaphoresis, fatigue and fever.  HENT: Positive for nosebleeds. Negative for congestion, sinus pressure, sore throat, tinnitus and trouble swallowing.   Eyes: Negative.  Negative  for visual disturbance.  Respiratory: Negative for cough, chest tightness, shortness of breath and wheezing.   Cardiovascular: Negative for chest pain, palpitations and leg swelling.  Gastrointestinal: Negative for abdominal pain, constipation, diarrhea, nausea and vomiting.  Endocrine: Negative.   Genitourinary: Negative.  Negative for difficulty urinating, dysuria and hematuria.  Musculoskeletal: Positive for arthralgias. Negative for myalgias.  Skin: Negative.  Negative for color change and rash.  Neurological: Negative.  Negative for dizziness, weakness, light-headedness and headaches.  Hematological: Negative for adenopathy. Does not bruise/bleed easily.  Psychiatric/Behavioral: Negative.     Objective:  BP (!) 160/92 (BP Location:  Right Arm, Patient Position: Sitting, Cuff Size: Normal)    Pulse 70    Temp 97.8 F (36.6 C) (Oral)    Resp 16    Ht '5\' 7"'$  (1.702 m)    Wt 163 lb 12 oz (74.3 kg)    SpO2 95%    BMI 25.65 kg/m   BP Readings from Last 3 Encounters:  10/25/18 (!) 160/92  10/06/18 (!) 158/80  05/16/18 (!) 160/88    Wt Readings from Last 3 Encounters:  10/25/18 163 lb 12 oz (74.3 kg)  10/06/18 164 lb (74.4 kg)  05/16/18 161 lb 12.8 oz (73.4 kg)    Physical Exam Vitals signs reviewed.  Constitutional:      Appearance: He is not ill-appearing or diaphoretic.  HENT:     Nose: No nasal deformity, nasal tenderness, mucosal edema, congestion or rhinorrhea.     Right Nostril: No foreign body, epistaxis, septal hematoma or occlusion.     Left Nostril: Epistaxis present. No foreign body, septal hematoma or occlusion.     Right Turbinates: Not swollen.     Left Turbinates: Not swollen.     Right Sinus: No maxillary sinus tenderness or frontal sinus tenderness.     Left Sinus: No maxillary sinus tenderness or frontal sinus tenderness.     Comments: There is superficial bleeding in the left nasopharynx over the anterior turbinates.    Mouth/Throat:     Mouth: Mucous membranes are moist.     Pharynx: No oropharyngeal exudate or posterior oropharyngeal erythema.  Eyes:     General: No scleral icterus.    Conjunctiva/sclera: Conjunctivae normal.  Neck:     Musculoskeletal: Normal range of motion. No neck rigidity or muscular tenderness.  Cardiovascular:     Rate and Rhythm: Normal rate and regular rhythm.     Heart sounds: No murmur.  Pulmonary:     Effort: Pulmonary effort is normal.     Breath sounds: No stridor. No wheezing, rhonchi or rales.  Abdominal:     General: Abdomen is flat. Bowel sounds are normal. There is no distension.     Palpations: There is no hepatomegaly or splenomegaly.     Tenderness: There is no abdominal tenderness.  Musculoskeletal: Normal range of motion.        General: No  swelling.  Lymphadenopathy:     Cervical: No cervical adenopathy.  Skin:    General: Skin is warm and dry.     Coloration: Skin is not jaundiced or pale.  Neurological:     General: No focal deficit present.     Mental Status: He is alert. Mental status is at baseline.  Psychiatric:        Mood and Affect: Mood normal.        Behavior: Behavior normal.     Lab Results  Component Value Date   WBC 6.1 10/25/2018   HGB  13.8 10/25/2018   HCT 41.5 10/25/2018   PLT 235.0 10/25/2018   GLUCOSE 88 10/25/2018   CHOL 183 02/20/2018   TRIG 98.0 02/20/2018   HDL 52.70 02/20/2018   LDLCALC 111 (H) 02/20/2018   ALT 14 02/20/2018   AST 23 02/20/2018   NA 139 10/25/2018   K 4.3 10/25/2018   CL 104 10/25/2018   CREATININE 0.97 10/25/2018   BUN 12 10/25/2018   CO2 26 10/25/2018   TSH 0.08 (L) 10/06/2018   PSA 2.8 10/26/2018   INR 1.13 08/31/2017   HGBA1C 5.1 12/15/2015    Ct Cervical Spine Wo Contrast  Result Date: 03/15/2018 CLINICAL DATA:  Closed fracture cervical vertebra. EXAM: CT CERVICAL SPINE WITHOUT CONTRAST TECHNIQUE: Multidetector CT imaging of the cervical spine was performed without intravenous contrast. Multiplanar CT image reconstructions were also generated. COMPARISON:  CT cervical spine 10/26/2017 FINDINGS: Alignment: Normal alignment with straightening of the cervical lordosis Skull base and vertebrae: Interval healing of bilateral C6 pedicle fractures. Fracture line remains evident but there is bony healing observed. No new fracture. No significant displacement of the fractures. Soft tissues and spinal canal: No soft tissue mass Disc levels: C1-2: Extensive pannus formation posterior to the dens causing cord flattening and moderate spinal stenosis. No change from the prior study C2-3: Severe disc degeneration with disc space narrowing diffuse endplate spurring. Bilateral facet hypertrophy. Cord flattening with moderate to severe spinal stenosis unchanged. Moderate foraminal  encroachment bilaterally right greater than left unchanged C3-4: ACDF with solid interbody fusion. Both facets appear fused. Mild spinal stenosis. C4-5: ACDF with solid interbody fusion.  No significant stenosis C5-6: ACDF with solid fusion. Posterior laminectomy. Posterior hardware fusion. Hardware in satisfactory position. C6-7: Disc degeneration and diffuse uncinate spurring causing moderate foraminal stenosis bilaterally. Posterior laminectomy bilaterally. Posterior hardware fusion. C7 screws cross the lateral recess bilaterally. This is unchanged. C7-T1: Posterior laminectomy. Posterior hardware fusion extending to T1. No significant stenosis. Upper chest: Apical emphysema.  No lung lesion. Other: None IMPRESSION: ACDF with solid fusion C3 through C6. Laminectomy and posterior hardware fusion C5 through T1. C7 screws cross the lateral recess bilaterally. Extensive degenerative change and pannus at C1-2 with moderate stenosis. Advanced disc degeneration C2-3 with moderate to severe spinal stenosis and moderate foraminal stenosis bilaterally. Continued healing of bilateral C6 pedicle fractures. Healing fracture posterior C6 vertebral body. Normal alignment of the fractures. Electronically Signed   By: Franchot Gallo M.D.   On: 03/15/2018 15:30    Assessment & Plan:   Yovanni was seen today for hypertension and anemia.  Diagnoses and all orders for this visit:  Deficiency anemia- His H&H are normal now.  I will monitor him for vitamin deficiencies. -     IBC panel; Future -     CBC with Differential/Platelet; Future -     Vitamin B12; Future -     Folate; Future -     Ferritin; Future -     Vitamin B1; Future -     Reticulocytes; Future  PSA elevation- His PSA is down to 2.8.  This is reassuring that he does not have prostate cancer. -     PSA, total and free; Future  Essential hypertension- His blood pressure is not adequately well controlled.  Will restart chlorthalidone. -     Basic  metabolic panel; Future -     Urinalysis, Routine w reflex microscopic; Future -     VITAMIN D 25 Hydroxy (Vit-D Deficiency, Fractures); Future -  chlorthalidone (HYGROTON) 25 MG tablet; Take 1 tablet (25 mg total) by mouth daily.  Left-sided epistaxis -     Ambulatory referral to ENT  Osteoarthritis of one hip, right- I ave asked him to stop taking NSAID to avoid the risk of complications.  Will try to control the pain with tramadol. -     traMADol (ULTRAM) 50 MG tablet; Take 1 tablet (50 mg total) by mouth every 8 (eight) hours as needed for up to 5 days.  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   I have discontinued Curtis Vance's meloxicam. I am also having him start on traMADol. Additionally, I am having him maintain his aspirin, vitamin C, [place]-Con M20, Vitamin D, albuterol, Glycopyrrolate, [place] Magnair Starter Kit, Turmeric, fexofenadine, guaiFENesin, gabapentin, ferrous sulfate, methocarbamol, rosuvastatin, cloNIDine, cloNIDine, Symbicort, levothyroxine, and chlorthalidone.  Meds ordered this encounter  Medications   traMADol (ULTRAM) 50 MG tablet    Sig: Take 1 tablet (50 mg total) by mouth every 8 (eight) hours as needed for up to 5 days.    Dispense:  90 tablet    Refill:  5   chlorthalidone (HYGROTON) 25 MG tablet    Sig: Take 1 tablet (25 mg total) by mouth daily.    Dispense:  90 tablet    Refill:  0     Follow-up: Return in about 3 months (around 01/25/2019).  Scarlette Calico, MD

## 2018-10-26 LAB — PSA: PSA: 2.8

## 2018-10-27 ENCOUNTER — Encounter: Payer: Self-pay | Admitting: Internal Medicine

## 2018-10-28 LAB — PSA, TOTAL AND FREE
PSA, % Free: 25 % (calc) — ABNORMAL LOW (ref >25)
PSA, Free: 0.7 ng/mL
PSA, Total: 2.8 ng/mL (ref <=4.0)

## 2018-10-28 LAB — RETICULOCYTES
ABS Retic: 69920 cells/uL (ref 25000–9000)
Retic Ct Pct: 1.6 %

## 2018-10-28 LAB — VITAMIN B1: Vitamin B1 (Thiamine): 18 nmol/L (ref 8–30)

## 2018-10-30 ENCOUNTER — Other Ambulatory Visit: Payer: Self-pay | Admitting: Orthopaedic Surgery

## 2018-10-30 DIAGNOSIS — H2511 Age-related nuclear cataract, right eye: Secondary | ICD-10-CM | POA: Diagnosis not present

## 2018-11-16 DIAGNOSIS — H2511 Age-related nuclear cataract, right eye: Secondary | ICD-10-CM | POA: Diagnosis not present

## 2018-11-16 DIAGNOSIS — H25041 Posterior subcapsular polar age-related cataract, right eye: Secondary | ICD-10-CM | POA: Diagnosis not present

## 2018-11-16 DIAGNOSIS — H25011 Cortical age-related cataract, right eye: Secondary | ICD-10-CM | POA: Diagnosis not present

## 2018-11-24 ENCOUNTER — Other Ambulatory Visit (HOSPITAL_COMMUNITY)
Admission: RE | Admit: 2018-11-24 | Discharge: 2018-11-24 | Disposition: A | Discharge status: Home / Self Care | Payer: Medicare Other | Source: Ambulatory Visit | Attending: Orthopaedic Surgery | Admitting: Orthopaedic Surgery

## 2018-11-24 DIAGNOSIS — Z01812 Encounter for preprocedural laboratory examination: Secondary | ICD-10-CM | POA: Diagnosis not present

## 2018-11-24 DIAGNOSIS — Z20828 Contact with and (suspected) exposure to other viral communicable diseases: Secondary | ICD-10-CM | POA: Diagnosis not present

## 2018-11-24 LAB — SARS CORONAVIRUS 2 (TAT 6-24 HRS): SARS Coronavirus 2: NEGATIVE

## 2018-11-27 ENCOUNTER — Encounter (HOSPITAL_COMMUNITY): Payer: Self-pay

## 2018-11-27 ENCOUNTER — Ambulatory Visit (HOSPITAL_COMMUNITY)
Admission: RE | Admit: 2018-11-27 | Discharge: 2018-11-27 | Disposition: A | Discharge status: Home / Self Care | Payer: Medicare Other | Source: Ambulatory Visit | Attending: Orthopaedic Surgery | Admitting: Orthopaedic Surgery

## 2018-11-27 ENCOUNTER — Encounter (HOSPITAL_COMMUNITY)
Admission: RE | Admit: 2018-11-27 | Discharge: 2018-11-27 | Disposition: A | Discharge status: Home / Self Care | Payer: Medicare Other | Source: Ambulatory Visit | Attending: Orthopaedic Surgery | Admitting: Orthopaedic Surgery

## 2018-11-27 ENCOUNTER — Other Ambulatory Visit: Payer: Self-pay

## 2018-11-27 DIAGNOSIS — E785 Hyperlipidemia, unspecified: Secondary | ICD-10-CM | POA: Diagnosis not present

## 2018-11-27 DIAGNOSIS — Z01818 Encounter for other preprocedural examination: Secondary | ICD-10-CM | POA: Diagnosis not present

## 2018-11-27 DIAGNOSIS — Z7982 Long term (current) use of aspirin: Secondary | ICD-10-CM | POA: Insufficient documentation

## 2018-11-27 DIAGNOSIS — N400 Enlarged prostate without lower urinary tract symptoms: Secondary | ICD-10-CM | POA: Diagnosis not present

## 2018-11-27 DIAGNOSIS — J449 Chronic obstructive pulmonary disease, unspecified: Secondary | ICD-10-CM | POA: Diagnosis not present

## 2018-11-27 DIAGNOSIS — Z79890 Hormone replacement therapy (postmenopausal): Secondary | ICD-10-CM | POA: Diagnosis not present

## 2018-11-27 DIAGNOSIS — M1611 Unilateral primary osteoarthritis, right hip: Secondary | ICD-10-CM | POA: Diagnosis not present

## 2018-11-27 DIAGNOSIS — Z79899 Other long term (current) drug therapy: Secondary | ICD-10-CM | POA: Insufficient documentation

## 2018-11-27 DIAGNOSIS — I10 Essential (primary) hypertension: Secondary | ICD-10-CM | POA: Insufficient documentation

## 2018-11-27 DIAGNOSIS — E039 Hypothyroidism, unspecified: Secondary | ICD-10-CM | POA: Diagnosis not present

## 2018-11-27 HISTORY — DX: Personal history of other diseases of the digestive system: Z87.19

## 2018-11-27 LAB — URINALYSIS, ROUTINE W REFLEX MICROSCOPIC
Bilirubin Urine: NEGATIVE
Glucose, UA: NEGATIVE mg/dL
Hgb urine dipstick: NEGATIVE
Ketones, ur: NEGATIVE mg/dL
Nitrite: NEGATIVE
Protein, ur: NEGATIVE mg/dL
Specific Gravity, Urine: 1.029 (ref 1.005–1.030)
pH: 5 (ref 5.0–8.0)

## 2018-11-27 LAB — CBC WITH DIFFERENTIAL/PLATELET
Abs Immature Granulocytes: 0.03 10*3/uL (ref 0.00–0.07)
Basophils Absolute: 0.1 10*3/uL (ref 0.0–0.1)
Basophils Relative: 2 %
Eosinophils Absolute: 0.1 10*3/uL (ref 0.0–0.5)
Eosinophils Relative: 1 %
HCT: 44.5 % (ref 39.0–52.0)
Hemoglobin: 14.5 g/dL (ref 13.0–17.0)
Immature Granulocytes: 0 %
Lymphocytes Relative: 22 %
Lymphs Abs: 1.5 10*3/uL (ref 0.7–4.0)
MCH: 30.9 pg (ref 26.0–34.0)
MCHC: 32.6 g/dL (ref 30.0–36.0)
MCV: 94.7 fL (ref 80.0–100.0)
Monocytes Absolute: 0.7 10*3/uL (ref 0.1–1.0)
Monocytes Relative: 10 %
Neutro Abs: 4.5 10*3/uL (ref 1.7–7.7)
Neutrophils Relative %: 65 %
Platelets: 275 10*3/uL (ref 150–400)
RBC: 4.7 MIL/uL (ref 4.22–5.81)
RDW: 12.8 % (ref 11.5–15.5)
WBC: 6.8 10*3/uL (ref 4.0–10.5)
nRBC: 0 % (ref 0.0–0.2)

## 2018-11-27 LAB — BASIC METABOLIC PANEL
Anion gap: 10 (ref 5–15)
BUN: 22 mg/dL (ref 8–23)
CO2: 29 mmol/L (ref 22–32)
Calcium: 9.9 mg/dL (ref 8.9–10.3)
Chloride: 101 mmol/L (ref 98–111)
Creatinine, Ser: 0.93 mg/dL (ref 0.61–1.24)
GFR calc Af Amer: >60 mL/min (ref >60)
GFR calc non Af Amer: >60 mL/min (ref >60)
Glucose, Bld: 106 mg/dL — ABNORMAL HIGH (ref 70–99)
Potassium: 3.8 mmol/L (ref 3.5–5.1)
Sodium: 140 mmol/L (ref 135–145)

## 2018-11-27 LAB — APTT: aPTT: 33 seconds (ref 24–36)

## 2018-11-27 LAB — PROTIME-INR
INR: 1.1 (ref 0.8–1.2)
Prothrombin Time: 13.6 seconds (ref 11.4–15.2)

## 2018-11-27 LAB — ABO/RH: ABO/RH(D): A POS

## 2018-11-27 MED ORDER — BUPIVACAINE LIPOSOME 1.3 % IJ SUSP
10.0000 mL | Freq: Once | INTRAMUSCULAR | Status: DC
  Filled 2018-11-27: qty 10

## 2018-11-27 MED ORDER — TRANEXAMIC ACID 1000 MG/10ML IV SOLN
2000.0000 mg | INTRAVENOUS | Status: DC
  Filled 2018-11-27: qty 20
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
## 2018-11-27 NOTE — Progress Notes (Signed)
SPOKE W/  Mcdonald     SCREENING SYMPTOMS OF COVID 19:   COUGH--NO  RUNNY NOSE--- NO  SORE THROAT---NO  NASAL CONGESTION----NO  SNEEZING----NO  SHORTNESS OF BREATH---NO   DIFFICULTY BREATHING---NO  TEMP >100.0 -----NO  UNEXPLAINED BODY ACHES------NO  CHILLS -------- NO  HEADACHES ---------NO  LOSS OF SMELL/ TASTE --------NO    HAVE YOU OR ANY FAMILY MEMBER TRAVELLED PAST 14 DAYS OUT OF THE   COUNTY---NO STATE----NO COUNTRY----NO  HAVE YOU OR ANY FAMILY MEMBER BEEN EXPOSED TO ANYONE WITH COVID 19? NO

## 2018-11-27 NOTE — Patient Instructions (Addendum)
DUE TO COVID-19 ONLY ONE VISITOR IS ALLOWED TO VISIT DURING VISITOR HOURS ONLY!!   COVID SWAB TESTING COMPLETED ON:  November 24, 2018 (Must self quarantine after testing. Follow instructions on handout.)             Your procedure is scheduled on: TOMORROW, AUG. 4, 2020   Report to [facility]  Entrance    Report to admitting at 10:00 AM   Call this number if you have problems the morning of surgery [phone]   Do not eat food:After Midnight.   MAY HAVE LIQUID UNTIL 9:30AM DAY OF SURGERY   CLEAR LIQUID DIET  Foods Allowed                                                                     Foods Excluded  Water, Black Coffee and tea, regular and decaf                             liquids that you cannot  Plain Jell-O in any flavor  (No red)                                           see through such as: Fruit ices (not with fruit pulp)                                     milk, soups, orange juice  Iced Popsicles (No red)                                    All solid food Carbonated beverages, regular and diet                                    Apple juices Sports drinks like Gatorade (No red) Lightly seasoned clear broth or consume(fat free) Sugar, honey syrup  Sample Menu Breakfast                                Lunch                                     Supper Cranberry juice                    Beef broth                            Chicken broth Jell-O                                     Grape juice  Apple juice Coffee or tea                        Jell-O                                      Popsicle                                                Coffee or tea                        Coffee or tea   Complete one Ensure drink the morning of surgery at 9:30 AM  the day of surgery.   Brush your teeth the morning of surgery.   Do NOT smoke after Midnight   Take these medicines the morning of surgery with A SIP OF WATER: LEVOTHYROXINE   USE  INHALER PER NORMAL ROUTINE DAY OF SURGERY   BRING ASTHMA INHALER DAY OF SURGERY                               You may not have any metal on your body including jewelry, and body piercings             Do not wear lotions, powders, perfumes/cologne, or deodorant                          Men may shave face and neck.   Do not bring valuables to the hospital. [place].   Contacts, dentures or bridgework may not be worn into surgery.   Bring small overnight bag day of surgery.    Special Instructions: Bring a copy of your healthcare power of attorney and living will documents         the day of surgery if you haven't scanned them in before.              Please read over the following fact sheets you were given:  [facility] - Preparing for Surgery Before surgery, you can play an important role.  Because skin is not sterile, your skin needs to be as free of germs as possible.  You can reduce the number of germs on your skin by washing with CHG (chlorahexidine gluconate) soap before surgery.  CHG is an antiseptic cleaner which kills germs and bonds with the skin to continue killing germs even after washing. Please DO NOT use if you have an allergy to CHG or antibacterial soaps.  If your skin becomes reddened/irritated stop using the CHG and inform your nurse when you arrive at Short Stay. Do not shave (including legs and underarms) for at least 48 hours prior to the first CHG shower.  You may shave your face/neck.  Please follow these instructions carefully:  1.  Shower with CHG Soap the night before surgery and the  morning of surgery.  2.  If you choose to wash your hair, wash your hair first as usual with your normal  shampoo.  3.  After you shampoo, rinse your  hair and body thoroughly to remove the shampoo.                             4.  Use CHG as you would any other liquid soap.  You can apply chg directly to the skin and wash.  Gently  with a scrungie or clean washcloth.  5.  Apply the CHG Soap to your body ONLY FROM THE NECK DOWN.   Do   not use on face/ open                           Wound or open sores. Avoid contact with eyes, ears mouth and   genitals (private parts).                       Wash face,  Genitals (private parts) with your normal soap.             6.  Wash thoroughly, paying special attention to the area where your    surgery  will be performed.  7.  Thoroughly rinse your body with warm water from the neck down.  8.  DO NOT shower/wash with your normal soap after using and rinsing off the CHG Soap.                9.  Pat yourself dry with a clean towel.            10.  Wear clean pajamas.            11.  Place clean sheets on your bed the night of your first shower and do not  sleep with pets. Day of Surgery : Do not apply any lotions/deodorants the morning of surgery.  Please wear clean clothes to the hospital/surgery center.  FAILURE TO FOLLOW THESE INSTRUCTIONS MAY RESULT IN THE CANCELLATION OF YOUR SURGERY  PATIENT SIGNATURE_________________________________  NURSE SIGNATURE__________________________________  ________________________________________________________________________   Adam Phenix  An incentive spirometer is a tool that can help keep your lungs clear and active. This tool measures how well you are filling your lungs with each breath. Taking long deep breaths may help reverse or decrease the chance of developing breathing (pulmonary) problems (especially infection) following:  A long period of time when you are unable to move or be active. BEFORE THE PROCEDURE   If the spirometer includes an indicator to show your best effort, your nurse or respiratory therapist will set it to a desired goal.  If possible, sit up straight or lean slightly forward. Try not to slouch.  Hold the incentive spirometer in an upright position. INSTRUCTIONS FOR USE  1. Sit on the edge of your  bed if possible, or sit up as far as you can in bed or on a chair. 2. Hold the incentive spirometer in an upright position. 3. Breathe out normally. 4. Place the mouthpiece in your mouth and seal your lips tightly around it. 5. Breathe in slowly and as deeply as possible, raising the piston or the ball toward the top of the column. 6. Hold your breath for 3-5 seconds or for as long as possible. Allow the piston or ball to fall to the bottom of the column. 7. Remove the mouthpiece from your mouth and breathe out normally. 8. Rest for a few seconds and repeat Steps 1 through 7 at least 10 times every 1-2 hours when you are  awake. Take your time and take a few normal breaths between deep breaths. 9. The spirometer may include an indicator to show your best effort. Use the indicator as a goal to work toward during each repetition. 10. After each set of 10 deep breaths, practice coughing to be sure your lungs are clear. If you have an incision (the cut made at the time of surgery), support your incision when coughing by placing a pillow or rolled up towels firmly against it. Once you are able to get out of bed, walk around indoors and cough well. You may stop using the incentive spirometer when instructed by your caregiver.  RISKS AND COMPLICATIONS  Take your time so you do not get dizzy or light-headed.  If you are in pain, you may need to take or ask for pain medication before doing incentive spirometry. It is harder to take a deep breath if you are having pain. AFTER USE  Rest and breathe slowly and easily.  It can be helpful to keep track of a log of your progress. Your caregiver can provide you with a simple table to help with this. If you are using the spirometer at home, follow these instructions: [place] IF:   You are having difficultly using the spirometer.  You have trouble using the spirometer as often as instructed.  Your pain medication is not giving enough relief while  using the spirometer.  You develop fever of 100.5 F (38.1 C) or higher. SEEK IMMEDIATE MEDICAL CARE IF:   You cough up bloody sputum that had not been present before.  You develop fever of 102 F (38.9 C) or greater.  You develop worsening pain at or near the incision site. MAKE SURE YOU:   Understand these instructions.  Will watch your condition.  Will get help right away if you are not doing well or get worse. Document Released: 08/23/2006 Document Revised: 07/05/2011 Document Reviewed: 10/24/2006 ExitCare Patient Information 2014 Exit[employer], [place].   ________________________________________________________________________  WHAT IS A BLOOD TRANSFUSION? Blood Transfusion Information  A transfusion is the replacement of blood or some of its parts. Blood is made up of multiple cells which provide different functions.  Red blood cells carry oxygen and are used for blood loss replacement.  White blood cells fight against infection.  Platelets control bleeding.  Plasma helps clot blood.  Other blood products are available for specialized needs, such as hemophilia or other clotting disorders. BEFORE THE TRANSFUSION  Who gives blood for transfusions?   Healthy volunteers who are fully evaluated to make sure their blood is safe. This is blood bank blood. Transfusion therapy is the safest it has ever been in the practice of medicine. Before blood is taken from a donor, a complete history is taken to make sure that person has no history of diseases nor engages in risky social behavior (examples are intravenous drug use or sexual activity with multiple partners). The donor's travel history is screened to minimize risk of transmitting infections, such as malaria. The donated blood is tested for signs of infectious diseases, such as HIV and hepatitis. The blood is then tested to be sure it is compatible with you in order to minimize the chance of a transfusion reaction. If you or a  relative donates blood, this is often done in anticipation of surgery and is not appropriate for emergency situations. It takes many days to process the donated blood. RISKS AND COMPLICATIONS Although transfusion therapy is very safe and saves many lives, the  main dangers of transfusion include:   Getting an infectious disease.  Developing a transfusion reaction. This is an allergic reaction to something in the blood you were given. Every precaution is taken to prevent this. The decision to have a blood transfusion has been considered carefully by your caregiver before blood is given. Blood is not given unless the benefits outweigh the risks. AFTER THE TRANSFUSION  Right after receiving a blood transfusion, you will usually feel much better and more energetic. This is especially true if your red blood cells have gotten low (anemic). The transfusion raises the level of the red blood cells which carry oxygen, and this usually causes an energy increase.  The nurse administering the transfusion will monitor you carefully for complications. HOME CARE INSTRUCTIONS  No special instructions are needed after a transfusion. You may find your energy is better. Speak with your caregiver about any limitations on activity for underlying diseases you may have. SEEK MEDICAL CARE IF:   Your condition is not improving after your transfusion.  You develop redness or irritation at the intravenous (IV) site. SEEK IMMEDIATE MEDICAL CARE IF:  Any of the following symptoms occur over the next 12 hours:  Shaking chills.  You have a temperature by mouth above 102 F (38.9 C), not controlled by medicine.  Chest, back, or muscle pain.  People around you feel you are not acting correctly or are confused.  Shortness of breath or difficulty breathing.  Dizziness and fainting.  You get a rash or develop hives.  You have a decrease in urine output.  Your urine turns a dark color or changes to pink, red, or  brown. Any of the following symptoms occur over the next 10 days:  You have a temperature by mouth above 102 F (38.9 C), not controlled by medicine.  Shortness of breath.  Weakness after normal activity.  The white part of the eye turns yellow (jaundice).  You have a decrease in the amount of urine or are urinating less often.  Your urine turns a dark color or changes to pink, red, or brown. Document Released: 04/09/2000 Document Revised: 07/05/2011 Document Reviewed: 11/27/2007 [facility] Patient Information 2014 [place], [place].  _______________________________________________________________________

## 2018-11-28 ENCOUNTER — Ambulatory Visit (HOSPITAL_COMMUNITY): Payer: Medicare Other | Admitting: Registered Nurse

## 2018-11-28 ENCOUNTER — Other Ambulatory Visit: Payer: Self-pay

## 2018-11-28 ENCOUNTER — Ambulatory Visit (HOSPITAL_COMMUNITY): Payer: Medicare Other

## 2018-11-28 ENCOUNTER — Observation Stay (HOSPITAL_COMMUNITY)
Admission: RE | Admit: 2018-11-28 | Discharge: 2018-11-29 | Disposition: A | Discharge status: Home / Self Care | Payer: Medicare Other | Attending: Orthopaedic Surgery | Admitting: Orthopaedic Surgery

## 2018-11-28 ENCOUNTER — Encounter (HOSPITAL_COMMUNITY)
Admission: RE | Disposition: A | Discharge status: Home / Self Care | Payer: Self-pay | Source: Home / Self Care | Attending: Orthopaedic Surgery

## 2018-11-28 ENCOUNTER — Ambulatory Visit (HOSPITAL_COMMUNITY): Payer: Medicare Other | Admitting: Physician Assistant

## 2018-11-28 ENCOUNTER — Encounter (HOSPITAL_COMMUNITY): Payer: Self-pay

## 2018-11-28 DIAGNOSIS — R64 Cachexia: Secondary | ICD-10-CM | POA: Diagnosis not present

## 2018-11-28 DIAGNOSIS — Z87891 Personal history of nicotine dependence: Secondary | ICD-10-CM | POA: Diagnosis not present

## 2018-11-28 DIAGNOSIS — E785 Hyperlipidemia, unspecified: Secondary | ICD-10-CM | POA: Diagnosis not present

## 2018-11-28 DIAGNOSIS — N400 Enlarged prostate without lower urinary tract symptoms: Secondary | ICD-10-CM | POA: Diagnosis not present

## 2018-11-28 DIAGNOSIS — M25751 Osteophyte, right hip: Secondary | ICD-10-CM | POA: Diagnosis not present

## 2018-11-28 DIAGNOSIS — Z6824 Body mass index (BMI) 24.0-24.9, adult: Secondary | ICD-10-CM | POA: Insufficient documentation

## 2018-11-28 DIAGNOSIS — Z419 Encounter for procedure for purposes other than remedying health state, unspecified: Secondary | ICD-10-CM

## 2018-11-28 DIAGNOSIS — E039 Hypothyroidism, unspecified: Secondary | ICD-10-CM | POA: Insufficient documentation

## 2018-11-28 DIAGNOSIS — D539 Nutritional anemia, unspecified: Secondary | ICD-10-CM | POA: Diagnosis not present

## 2018-11-28 DIAGNOSIS — Z86010 Personal history of colonic polyps: Secondary | ICD-10-CM | POA: Diagnosis not present

## 2018-11-28 DIAGNOSIS — K210 Gastro-esophageal reflux disease with esophagitis: Secondary | ICD-10-CM | POA: Insufficient documentation

## 2018-11-28 DIAGNOSIS — E7800 Pure hypercholesterolemia, unspecified: Secondary | ICD-10-CM | POA: Diagnosis not present

## 2018-11-28 DIAGNOSIS — R2689 Other abnormalities of gait and mobility: Secondary | ICD-10-CM | POA: Insufficient documentation

## 2018-11-28 DIAGNOSIS — Z79899 Other long term (current) drug therapy: Secondary | ICD-10-CM | POA: Insufficient documentation

## 2018-11-28 DIAGNOSIS — G959 Disease of spinal cord, unspecified: Secondary | ICD-10-CM | POA: Diagnosis not present

## 2018-11-28 DIAGNOSIS — K449 Diaphragmatic hernia without obstruction or gangrene: Secondary | ICD-10-CM | POA: Diagnosis not present

## 2018-11-28 DIAGNOSIS — Z7982 Long term (current) use of aspirin: Secondary | ICD-10-CM | POA: Insufficient documentation

## 2018-11-28 DIAGNOSIS — G709 Myoneural disorder, unspecified: Secondary | ICD-10-CM | POA: Diagnosis not present

## 2018-11-28 DIAGNOSIS — M24851 Other specific joint derangements of right hip, not elsewhere classified: Secondary | ICD-10-CM | POA: Diagnosis not present

## 2018-11-28 DIAGNOSIS — Z79890 Hormone replacement therapy (postmenopausal): Secondary | ICD-10-CM | POA: Insufficient documentation

## 2018-11-28 DIAGNOSIS — M1611 Unilateral primary osteoarthritis, right hip: Secondary | ICD-10-CM | POA: Diagnosis not present

## 2018-11-28 DIAGNOSIS — I10 Essential (primary) hypertension: Secondary | ICD-10-CM | POA: Diagnosis not present

## 2018-11-28 DIAGNOSIS — Z471 Aftercare following joint replacement surgery: Secondary | ICD-10-CM | POA: Diagnosis not present

## 2018-11-28 DIAGNOSIS — J449 Chronic obstructive pulmonary disease, unspecified: Secondary | ICD-10-CM | POA: Insufficient documentation

## 2018-11-28 DIAGNOSIS — Z96641 Presence of right artificial hip joint: Secondary | ICD-10-CM | POA: Diagnosis not present

## 2018-11-28 HISTORY — PX: TOTAL HIP ARTHROPLASTY: SHX124

## 2018-11-28 LAB — TYPE AND SCREEN
ABO/RH(D): A POS
Antibody Screen: NEGATIVE

## 2018-11-28 LAB — SURGICAL PCR SCREEN
MRSA, PCR: NEGATIVE
Staphylococcus aureus: POSITIVE — AB

## 2018-11-28 SURGERY — ARTHROPLASTY, HIP, TOTAL, ANTERIOR APPROACH
Anesthesia: Spinal | Site: Hip | Laterality: Right

## 2018-11-28 MED ORDER — GATIFLOXACIN 0.5 % OP SOLN
1.0000 [drp] | Freq: Four times a day (QID) | OPHTHALMIC | Status: DC
  Administered 2018-11-29: 1 [drp] via OPHTHALMIC
  Filled 2018-11-28: qty 2.5

## 2018-11-28 MED ORDER — POTASSIUM CHLORIDE CRYS ER 20 MEQ PO TBCR: 20.0000 meq | EXTENDED_RELEASE_TABLET | Freq: Every day | ORAL | Status: DC | PRN

## 2018-11-28 MED ORDER — LEVOTHYROXINE SODIUM 100 MCG PO TABS
100.0000 ug | ORAL_TABLET | Freq: Every day | ORAL | Status: DC
  Administered 2018-11-29: 100 ug via ORAL
  Filled 2018-11-28: qty 1

## 2018-11-28 MED ORDER — BUPIVACAINE-EPINEPHRINE (PF) 0.25% -1:200000 IJ SOLN
INTRAMUSCULAR | Status: DC | PRN
  Administered 2018-11-28: 30 mL

## 2018-11-28 MED ORDER — POVIDONE-IODINE 10 % EX SWAB
2.0000 "application " | Freq: Once | CUTANEOUS | Status: AC
  Administered 2018-11-28: 2 via TOPICAL

## 2018-11-28 MED ORDER — ONDANSETRON HCL 4 MG/2ML IJ SOLN
INTRAMUSCULAR | Status: DC | PRN
  Administered 2018-11-28: 4 mg via INTRAVENOUS

## 2018-11-28 MED ORDER — BUPIVACAINE LIPOSOME 1.3 % IJ SUSP
INTRAMUSCULAR | Status: DC | PRN
  Administered 2018-11-28: 10 mL

## 2018-11-28 MED ORDER — PROPOFOL 10 MG/ML IV BOLUS
INTRAVENOUS | Status: AC
  Filled 2018-11-28: qty 60

## 2018-11-28 MED ORDER — ACETAMINOPHEN 325 MG PO TABS: 325.0000 mg | ORAL_TABLET | Freq: Four times a day (QID) | ORAL | Status: DC | PRN

## 2018-11-28 MED ORDER — ACETAMINOPHEN 500 MG PO TABS
1000.0000 mg | ORAL_TABLET | Freq: Four times a day (QID) | ORAL | Status: DC
  Administered 2018-11-28 – 2018-11-29 (×3): 1000 mg via ORAL
  Filled 2018-11-28 (×3): qty 2

## 2018-11-28 MED ORDER — LACTATED RINGERS IV SOLN
INTRAVENOUS | Status: DC
  Administered 2018-11-28 (×2): via INTRAVENOUS

## 2018-11-28 MED ORDER — DIPHENHYDRAMINE HCL 12.5 MG/5ML PO ELIX
12.5000 mg | ORAL_SOLUTION | ORAL | Status: DC | PRN
  Administered 2018-11-28: 25 mg via ORAL
  Filled 2018-11-28: qty 10

## 2018-11-28 MED ORDER — PHENOL 1.4 % MT LIQD: 1.0000 | OROMUCOSAL | Status: DC | PRN

## 2018-11-28 MED ORDER — KETOROLAC TROMETHAMINE 15 MG/ML IJ SOLN
7.5000 mg | Freq: Four times a day (QID) | INTRAMUSCULAR | Status: DC
  Administered 2018-11-28 – 2018-11-29 (×3): 7.5 mg via INTRAVENOUS
  Filled 2018-11-28 (×3): qty 1

## 2018-11-28 MED ORDER — BUPIVACAINE IN DEXTROSE 0.75-8.25 % IT SOLN
INTRATHECAL | Status: DC | PRN
  Administered 2018-11-28: 1.6 mL via INTRATHECAL

## 2018-11-28 MED ORDER — BISACODYL 5 MG PO TBEC: 5.0000 mg | DELAYED_RELEASE_TABLET | Freq: Every day | ORAL | Status: DC | PRN

## 2018-11-28 MED ORDER — ASPIRIN 81 MG PO CHEW
81.0000 mg | CHEWABLE_TABLET | Freq: Two times a day (BID) | ORAL | Status: DC
  Administered 2018-11-29: 81 mg via ORAL
  Filled 2018-11-28 (×2): qty 1

## 2018-11-28 MED ORDER — DOCUSATE SODIUM 100 MG PO CAPS
100.0000 mg | ORAL_CAPSULE | Freq: Two times a day (BID) | ORAL | Status: DC
  Administered 2018-11-28 – 2018-11-29 (×2): 100 mg via ORAL
  Filled 2018-11-28 (×2): qty 1

## 2018-11-28 MED ORDER — OXYCODONE HCL 5 MG PO TABS
10.0000 mg | ORAL_TABLET | ORAL | Status: DC | PRN
  Administered 2018-11-28: 15 mg via ORAL
  Filled 2018-11-28: qty 2
  Filled 2018-11-28: qty 3

## 2018-11-28 MED ORDER — ONDANSETRON HCL 4 MG/2ML IJ SOLN: 4.0000 mg | Freq: Four times a day (QID) | INTRAMUSCULAR | Status: DC | PRN

## 2018-11-28 MED ORDER — KETOROLAC TROMETHAMINE 0.5 % OP SOLN
1.0000 [drp] | Freq: Four times a day (QID) | OPHTHALMIC | Status: DC
  Administered 2018-11-28 – 2018-11-29 (×2): 1 [drp] via OPHTHALMIC
  Filled 2018-11-28: qty 5

## 2018-11-28 MED ORDER — METHOCARBAMOL 500 MG PO TABS: 750.0000 mg | ORAL_TABLET | Freq: Four times a day (QID) | ORAL | Status: DC | PRN

## 2018-11-28 MED ORDER — CEFAZOLIN SODIUM-DEXTROSE 2-4 GM/100ML-% IV SOLN
2.0000 g | INTRAVENOUS | Status: AC
  Administered 2018-11-28: 2 g via INTRAVENOUS
  Filled 2018-11-28: qty 100

## 2018-11-28 MED ORDER — METOCLOPRAMIDE HCL 5 MG PO TABS: 5.0000 mg | ORAL_TABLET | Freq: Three times a day (TID) | ORAL | Status: DC | PRN

## 2018-11-28 MED ORDER — ONDANSETRON HCL 4 MG PO TABS: 4.0000 mg | ORAL_TABLET | Freq: Four times a day (QID) | ORAL | Status: DC | PRN

## 2018-11-28 MED ORDER — KETOROLAC TROMETHAMINE 15 MG/ML IJ SOLN: 15.0000 mg | Freq: Once | INTRAMUSCULAR | Status: DC

## 2018-11-28 MED ORDER — HYDROMORPHONE HCL 1 MG/ML IJ SOLN: 0.5000 mg | INTRAMUSCULAR | Status: DC | PRN

## 2018-11-28 MED ORDER — ALUM & MAG HYDROXIDE-SIMETH 200-200-20 MG/5ML PO SUSP: 30.0000 mL | ORAL | Status: DC | PRN

## 2018-11-28 MED ORDER — MIDAZOLAM HCL 5 MG/5ML IJ SOLN
INTRAMUSCULAR | Status: DC | PRN
  Administered 2018-11-28 (×2): 1 mg via INTRAVENOUS

## 2018-11-28 MED ORDER — CEFAZOLIN SODIUM-DEXTROSE 2-4 GM/100ML-% IV SOLN
2.0000 g | Freq: Four times a day (QID) | INTRAVENOUS | Status: AC
  Administered 2018-11-28 (×2): 2 g via INTRAVENOUS
  Filled 2018-11-28 (×2): qty 100

## 2018-11-28 MED ORDER — CLONIDINE HCL 0.1 MG PO TABS: 0.1000 mg | ORAL_TABLET | Freq: Every day | ORAL | Status: DC | PRN

## 2018-11-28 MED ORDER — HYDROMORPHONE HCL 1 MG/ML IJ SOLN: 0.2500 mg | INTRAMUSCULAR | Status: DC | PRN

## 2018-11-28 MED ORDER — DEXAMETHASONE SODIUM PHOSPHATE 10 MG/ML IJ SOLN
INTRAMUSCULAR | Status: AC
  Filled 2018-11-28: qty 3

## 2018-11-28 MED ORDER — PHENYLEPHRINE 40 MCG/ML (10ML) SYRINGE FOR IV PUSH (FOR BLOOD PRESSURE SUPPORT)
PREFILLED_SYRINGE | INTRAVENOUS | Status: AC
  Filled 2018-11-28: qty 10

## 2018-11-28 MED ORDER — METOCLOPRAMIDE HCL 5 MG/ML IJ SOLN: 5.0000 mg | Freq: Three times a day (TID) | INTRAMUSCULAR | Status: DC | PRN

## 2018-11-28 MED ORDER — PROPOFOL 500 MG/50ML IV EMUL
INTRAVENOUS | Status: DC | PRN
  Administered 2018-11-28: 50 ug/kg/min via INTRAVENOUS

## 2018-11-28 MED ORDER — TRANEXAMIC ACID 1000 MG/10ML IV SOLN
INTRAVENOUS | Status: DC | PRN
  Administered 2018-11-28: 2000 mg via TOPICAL

## 2018-11-28 MED ORDER — LACTATED RINGERS IV SOLN
INTRAVENOUS | Status: DC
  Administered 2018-11-29: 05:00:00 via INTRAVENOUS

## 2018-11-28 MED ORDER — ONDANSETRON HCL 4 MG/2ML IJ SOLN
INTRAMUSCULAR | Status: AC
  Filled 2018-11-28: qty 2

## 2018-11-28 MED ORDER — BUPIVACAINE-EPINEPHRINE (PF) 0.25% -1:200000 IJ SOLN
INTRAMUSCULAR | Status: AC
  Filled 2018-11-28: qty 30

## 2018-11-28 MED ORDER — PROMETHAZINE HCL 25 MG/ML IJ SOLN: 6.2500 mg | INTRAMUSCULAR | Status: DC | PRN

## 2018-11-28 MED ORDER — MEPERIDINE HCL 50 MG/ML IJ SOLN: 6.2500 mg | INTRAMUSCULAR | Status: DC | PRN

## 2018-11-28 MED ORDER — MOMETASONE FURO-FORMOTEROL FUM 200-5 MCG/ACT IN AERO
2.0000 | INHALATION_SPRAY | Freq: Two times a day (BID) | RESPIRATORY_TRACT | Status: DC
  Administered 2018-11-28 – 2018-11-29 (×2): 2 via RESPIRATORY_TRACT
  Filled 2018-11-28: qty 8.8

## 2018-11-28 MED ORDER — DEXAMETHASONE SODIUM PHOSPHATE 10 MG/ML IJ SOLN
INTRAMUSCULAR | Status: DC | PRN
  Administered 2018-11-28: 10 mg via INTRAVENOUS

## 2018-11-28 MED ORDER — OXYCODONE HCL 5 MG PO TABS
5.0000 mg | ORAL_TABLET | ORAL | Status: DC | PRN
  Administered 2018-11-29: 10 mg via ORAL

## 2018-11-28 MED ORDER — FENTANYL CITRATE (PF) 100 MCG/2ML IJ SOLN
INTRAMUSCULAR | Status: DC | PRN
  Administered 2018-11-28 (×2): 50 ug via INTRAVENOUS

## 2018-11-28 MED ORDER — LIDOCAINE 2% (20 MG/ML) 5 ML SYRINGE
INTRAMUSCULAR | Status: AC
  Filled 2018-11-28: qty 5

## 2018-11-28 MED ORDER — RISAQUAD PO CAPS
1.0000 | ORAL_CAPSULE | Freq: Every day | ORAL | Status: DC
  Administered 2018-11-29: 1 via ORAL
  Filled 2018-11-28: qty 1

## 2018-11-28 MED ORDER — MIDAZOLAM HCL 2 MG/2ML IJ SOLN
INTRAMUSCULAR | Status: AC
  Filled 2018-11-28: qty 2

## 2018-11-28 MED ORDER — 0.9 % SODIUM CHLORIDE (POUR BTL) OPTIME
TOPICAL | Status: DC | PRN
  Administered 2018-11-28: 1000 mL

## 2018-11-28 MED ORDER — TRANEXAMIC ACID-NACL 1000-0.7 MG/100ML-% IV SOLN
1000.0000 mg | Freq: Once | INTRAVENOUS | Status: AC
  Administered 2018-11-28: 1000 mg via INTRAVENOUS
  Filled 2018-11-28: qty 100

## 2018-11-28 MED ORDER — FENTANYL CITRATE (PF) 100 MCG/2ML IJ SOLN
INTRAMUSCULAR | Status: AC
  Filled 2018-11-28: qty 2

## 2018-11-28 MED ORDER — LIDOCAINE 2% (20 MG/ML) 5 ML SYRINGE
INTRAMUSCULAR | Status: DC | PRN
  Administered 2018-11-28: 40 mg via INTRAVENOUS

## 2018-11-28 MED ORDER — CHLORHEXIDINE GLUCONATE 4 % EX LIQD: 60.0000 mL | Freq: Once | CUTANEOUS | Status: DC

## 2018-11-28 MED ORDER — CHLORTHALIDONE 25 MG PO TABS
25.0000 mg | ORAL_TABLET | Freq: Every day | ORAL | Status: DC
  Administered 2018-11-28 – 2018-11-29 (×2): 25 mg via ORAL
  Filled 2018-11-28 (×2): qty 1

## 2018-11-28 MED ORDER — MENTHOL 3 MG MT LOZG: 1.0000 | LOZENGE | OROMUCOSAL | Status: DC | PRN

## 2018-11-28 MED ORDER — METHOCARBAMOL 500 MG IVPB - SIMPLE MED
500.0000 mg | Freq: Four times a day (QID) | INTRAVENOUS | Status: DC | PRN
  Filled 2018-11-28: qty 50

## 2018-11-28 MED ORDER — TRANEXAMIC ACID-NACL 1000-0.7 MG/100ML-% IV SOLN
1000.0000 mg | INTRAVENOUS | Status: AC
  Administered 2018-11-28: 1000 mg via INTRAVENOUS
  Filled 2018-11-28: qty 100

## 2018-11-28 MED ORDER — DEXAMETHASONE SODIUM PHOSPHATE 10 MG/ML IJ SOLN
INTRAMUSCULAR | Status: AC
  Filled 2018-11-28: qty 1

## 2018-11-28 SURGICAL SUPPLY — 44 items
BAG DECANTER FOR FLEXI CONT (MISCELLANEOUS) ×2 IMPLANT
BLADE SAW SGTL 18X1.27X75 (BLADE) ×2 IMPLANT
CELLS DAT CNTRL 66122 CELL SVR (MISCELLANEOUS) ×1 IMPLANT
COVER PERINEAL POST (MISCELLANEOUS) ×2 IMPLANT
COVER SURGICAL LIGHT HANDLE (MISCELLANEOUS) ×2 IMPLANT
COVER WAND RF STERILE (DRAPES) IMPLANT
CUP ACETABULAR GRIPTON 100 52 (Orthopedic Implant) IMPLANT
DECANTER SPIKE VIAL GLASS SM (MISCELLANEOUS) ×2 IMPLANT
DRAPE IMP U-DRAPE 54X76 (DRAPES) ×2 IMPLANT
DRAPE STERI IOBAN 125X83 (DRAPES) ×2 IMPLANT
DRAPE U-SHAPE 47X51 STRL (DRAPES) ×4 IMPLANT
DRSG AQUACEL AG ADV 3.5X10 (GAUZE/BANDAGES/DRESSINGS) ×2 IMPLANT
DURAPREP 26ML APPLICATOR (WOUND CARE) ×2 IMPLANT
ELECT BLADE TIP CTD 4 INCH (ELECTRODE) ×2 IMPLANT
ELECT REM PT RETURN 15FT ADLT (MISCELLANEOUS) ×2 IMPLANT
ELIMINATOR HOLE APEX DEPUY (Hips) ×1 IMPLANT
GLOVE BIO SURGEON STRL SZ8 (GLOVE) ×4 IMPLANT
GLOVE BIOGEL PI IND STRL 8 (GLOVE) ×2 IMPLANT
GLOVE BIOGEL PI INDICATOR 8 (GLOVE) ×2
GOWN STRL REUS W/TWL XL LVL3 (GOWN DISPOSABLE) ×4 IMPLANT
GRIPTON 100 52 (Orthopedic Implant) ×2 IMPLANT
HEAD M SROM 36MM 2 (Hips) IMPLANT
HOLDER FOLEY CATH W/STRAP (MISCELLANEOUS) ×2 IMPLANT
KIT TURNOVER KIT A (KITS) ×1 IMPLANT
LINER NEUTRAL 52X36MM PLUS 4 (Liner) ×1 IMPLANT
MANIFOLD NEPTUNE II (INSTRUMENTS) ×2 IMPLANT
NEEDLE HYPO 22GX1.5 SAFETY (NEEDLE) ×2 IMPLANT
NS IRRIG 1000ML POUR BTL (IV SOLUTION) ×1 IMPLANT
PACK ANTERIOR HIP CUSTOM (KITS) ×2 IMPLANT
PROTECTOR NERVE ULNAR (MISCELLANEOUS) ×2 IMPLANT
RETRACTOR WND ALEXIS 18 MED (MISCELLANEOUS) ×1 IMPLANT
RTRCTR WOUND ALEXIS 18CM MED (MISCELLANEOUS) ×2
SROM M HEAD 36MM 2 (Hips) ×2 IMPLANT
STEM FEMORAL SZ 6MM STD ACTIS (Stem) ×1 IMPLANT
SUT ETHIBOND NAB CT1 #1 30IN (SUTURE) ×4 IMPLANT
SUT VIC AB 1 CT1 36 (SUTURE) ×2 IMPLANT
SUT VIC AB 2-0 CT1 27 (SUTURE) ×2
SUT VIC AB 2-0 CT1 TAPERPNT 27 (SUTURE) ×1 IMPLANT
SUT VIC AB 3-0 PS2 18 (SUTURE) ×2
SUT VIC AB 3-0 PS2 18XBRD (SUTURE) ×1 IMPLANT
SUT VLOC 180 0 24IN GS25 (SUTURE) ×2 IMPLANT
SYR 50ML LL SCALE MARK (SYRINGE) ×1 IMPLANT
TRAY FOLEY MTR SLVR 16FR STAT (SET/KITS/TRAYS/PACK) ×2 IMPLANT
YANKAUER SUCT BULB TIP 10FT TU (MISCELLANEOUS) ×2 IMPLANT

## 2018-11-28 NOTE — Anesthesia Procedure Notes (Signed)
Date/Time: 11/28/2018 10:32 AM Performed by: Talbot Grumbling, CRNA Oxygen Delivery Method: Simple face mask

## 2018-11-28 NOTE — Interval H&P Note (Signed)
History and Physical Interval Note:  11/28/2018 11:27 AM  Curtis Vance  has presented today for surgery, with the diagnosis of Right Hip Degenerative Joint Disease.  The various methods of treatment have been discussed with the patient and family. After consideration of risks, benefits and other options for treatment, the patient has consented to  Procedure(s): RIght Anterior Hip Arthroplasty (Right) as a surgical intervention.  The patient's history has been reviewed, patient examined, no change in status, stable for surgery.  I have reviewed the patient's chart and labs.  Questions were answered to the patient's satisfaction.     Hessie Dibble

## 2018-11-28 NOTE — Anesthesia Postprocedure Evaluation (Signed)
Anesthesia Post Note  Patient: Curtis Vance  Procedure(s) Performed: RIght Anterior Hip Arthroplasty (Right Hip)     Patient location during evaluation: PACU Anesthesia Type: Spinal Level of consciousness: sedated Pain management: pain level controlled Vital Signs Assessment: post-procedure vital signs reviewed and stable Respiratory status: patient connected to nasal cannula oxygen Cardiovascular status: stable Postop Assessment: no headache, no backache, spinal receding and no apparent nausea or vomiting Anesthetic complications: no    Last Vitals:  Vitals:   11/28/18 1545 11/28/18 1600  BP: 91/67 117/79  Pulse: (!) 49 (!) 47  Resp: 11 11  Temp:    SpO2: 100% 100%    Last Pain:  Vitals:   11/28/18 1600  TempSrc:   PainSc: 0-No pain   Pain Goal:    LLE Motor Response: Purposeful movement (11/28/18 1600) LLE Sensation: Decreased, No numbness (11/28/18 1600) RLE Motor Response: Purposeful movement (11/28/18 1600) RLE Sensation: Decreased, Numbness (11/28/18 1600) L Sensory Level: L4-Anterior knee, lower leg (11/28/18 1600) R Sensory Level: L4-Anterior knee, lower leg (11/28/18 1600) Epidural/Spinal Function Patient able to flex knees: Yes (11/28/18 1600)  Huston Foley

## 2018-11-28 NOTE — Anesthesia Preprocedure Evaluation (Addendum)
Anesthesia Evaluation  Patient identified by MRN, date of birth, ID band Patient awake    Reviewed: Allergy & Precautions, NPO status , Patient's Chart, lab work & pertinent test results  Airway Mallampati: I       Dental no notable dental hx.    Pulmonary COPD,  COPD inhaler, former smoker,    Pulmonary exam normal breath sounds clear to auscultation       Cardiovascular hypertension, Pt. on medications Normal cardiovascular exam Rhythm:Regular Rate:Normal     Neuro/Psych    GI/Hepatic   Endo/Other  Hypothyroidism   Renal/GU      Musculoskeletal   Abdominal Normal abdominal exam  (+)   Peds  Hematology   Anesthesia Other Findings   Reproductive/Obstetrics                            Anesthesia Physical Anesthesia Plan  ASA: II  Anesthesia Plan: Spinal   Post-op Pain Management:    Induction:   PONV Risk Score and Plan: 2 and Dexamethasone, Ondansetron and Treatment may vary due to age or medical condition  Airway Management Planned: Nasal Cannula, Natural Airway and Simple Face Mask  Additional Equipment:   Intra-op Plan:   Post-operative Plan:   Informed Consent: I have reviewed the patients History and Physical, chart, labs and discussed the procedure including the risks, benefits and alternatives for the proposed anesthesia with the patient or authorized representative who has indicated his/her understanding and acceptance.       Plan Discussed with: CRNA  Anesthesia Plan Comments:         Anesthesia Quick Evaluation

## 2018-11-28 NOTE — Evaluation (Signed)
Physical Therapy Evaluation Patient Details Name: Curtis Vance MRN: [id number] DOB: 11/17/1943 Today's Date: 11/28/2018   History of Present Illness  R DA-THA; PMH of cervical fusion, COPD, HTN  Clinical Impression  Pt is s/p THA resulting in the deficits listed below (see PT Problem List). Pt ambulated 50' with RW, no loss of balance. Initiated THA HEP. Good progress expected.  Pt will benefit from skilled PT to increase their independence and safety with mobility to allow discharge to the venue listed below.      Follow Up Recommendations Follow surgeon's recommendation for DC plan and follow-up therapies    Equipment Recommendations  Rolling walker with 5" wheels;3in1 (PT)    Recommendations for Other Services       Precautions / Restrictions Precautions Precautions: Fall Restrictions Weight Bearing Restrictions: No Other Position/Activity Restrictions: WBAT      Mobility  Bed Mobility Overal bed mobility: Modified Independent             General bed mobility comments: HOB up, used rails  Transfers Overall transfer level: Needs assistance Equipment used: Rolling walker (2 wheeled) Transfers: Sit to/from Stand Sit to Stand: Min guard;From elevated surface         General transfer comment: VCs hand placement  Ambulation/Gait Ambulation/Gait assistance: Min guard Gait Distance (Feet): 80 Feet Assistive device: Rolling walker (2 wheeled) Gait Pattern/deviations: Step-to pattern;Decreased stride length Gait velocity: decr   General Gait Details: VCs sequencing, no loss of balance  Stairs            Wheelchair Mobility    Modified Rankin (Stroke Patients Only)       Balance Overall balance assessment: Modified Independent                                           Pertinent Vitals/Pain Pain Assessment: 0-10 Pain Score: 1  Pain Location: R hip burning Pain Descriptors / Indicators: Burning Pain Intervention(s): Limited  activity within patient's tolerance;Monitored during session;RN gave pain meds during session;Ice applied    Home Living Family/patient expects to be discharged to:: Private residence Living Arrangements: Spouse/significant other Available Help at Discharge: Family;Available 24 hours/day   Home Access: Level entry     Home Layout: One level Home Equipment: Cane - single point Additional Comments: lives with wife who is in poor health, pt stated other family members will assist him at home    Prior Function Level of Independence: Independent               Hand Dominance        Extremity/Trunk Assessment   Upper Extremity Assessment Upper Extremity Assessment: Overall WFL for tasks assessed    Lower Extremity Assessment Lower Extremity Assessment: RLE deficits/detail RLE Deficits / Details: knee ext +3/5 RLE Sensation: WNL RLE Coordination: WNL    Cervical / Trunk Assessment Cervical / Trunk Assessment: Normal  Communication   Communication: No difficulties  Cognition Arousal/Alertness: Awake/alert Behavior During Therapy: WFL for tasks assessed/performed Overall Cognitive Status: Within Functional Limits for tasks assessed                                        General Comments      Exercises Total Joint Exercises Ankle Circles/Pumps: AROM;10 reps;Both;Supine Heel Slides: AAROM;Right;10 reps;Supine Long Arc Quad:  AROM;Right;5 reps;Seated   Assessment/Plan    PT Assessment Patient needs continued PT services  PT Problem List Decreased strength;Decreased activity tolerance;Decreased knowledge of use of DME;Decreased mobility;Pain       PT Treatment Interventions DME instruction;Gait training;Functional mobility training;Therapeutic exercise;Therapeutic activities;Patient/family education    PT Goals (Current goals can be found in the Care Plan section)  Acute Rehab PT Goals Patient Stated Goal: to walk without pain, help care for wife  who is in poor health PT Goal Formulation: With patient Time For Goal Achievement: 12/05/18 Potential to Achieve Goals: Good    Frequency 7X/week   Barriers to discharge        Co-evaluation               AM-PAC PT "6 Clicks" Mobility  Outcome Measure Help needed turning from your back to your side while in a flat bed without using bedrails?: A Little Help needed moving from lying on your back to sitting on the side of a flat bed without using bedrails?: A Little Help needed moving to and from a bed to a chair (including a wheelchair)?: A Little Help needed standing up from a chair using your arms (e.g., wheelchair or bedside chair)?: A Little Help needed to walk in hospital room?: A Little Help needed climbing 3-5 steps with a railing? : A Lot 6 Click Score: 17    End of Session Equipment Utilized During Treatment: Gait belt Activity Tolerance: Patient tolerated treatment well;No increased pain Patient left: in chair;with call bell/phone within reach Nurse Communication: Mobility status PT Visit Diagnosis: Muscle weakness (generalized) (M62.81);Difficulty in walking, not elsewhere classified (R26.2)    Time: 3335-4562 PT Time Calculation (min) (ACUTE ONLY): 28 min   Charges:   PT Evaluation $PT Eval Low Complexity: 1 Low PT Treatments $Gait Training: 8-22 mins       [name] PT 11/28/2018  Acute Rehabilitation Services Pager [phone] Office [phone]

## 2018-11-28 NOTE — Anesthesia Procedure Notes (Signed)
Spinal  Patient location during procedure: OR Start time: 11/28/2018 12:28 PM End time: 11/28/2018 12:30 PM Staffing Anesthesiologist: Lyn Hollingshead, MD Performed: anesthesiologist  Preanesthetic Checklist Completed: patient identified, site marked, surgical consent, pre-op evaluation, timeout performed, IV checked, risks and benefits discussed and monitors and equipment checked Spinal Block Patient position: sitting Prep: site prepped and draped and DuraPrep Patient monitoring: continuous pulse ox and blood pressure Approach: midline Location: L3-4 Injection technique: single-shot Needle Needle type: Pencan  Needle gauge: 24 G Needle length: 10 cm Needle insertion depth: 6 cm

## 2018-11-28 NOTE — Op Note (Signed)

## 2018-11-28 NOTE — Transfer of Care (Signed)
Immediate Anesthesia Transfer of Care Note  Patient: Curtis Vance  Procedure(s) Performed: RIght Anterior Hip Arthroplasty (Right Hip)  Patient Location: PACU  Anesthesia Type:Spinal  Level of Consciousness: sedated  Airway & Oxygen Therapy: Patient Spontanous Breathing and Patient connected to face mask oxygen  Post-op Assessment: Report given to RN and Post -op Vital signs reviewed and stable  Post vital signs: Reviewed and stable  Last Vitals:  Vitals Value Taken Time  BP 96/58 11/28/18 1442  Temp    Pulse 55 11/28/18 1444  Resp 21 11/28/18 1444  SpO2 100 % 11/28/18 1444  Vitals shown include unvalidated device data.  Last Pain:  Vitals:   11/28/18 1041  TempSrc: Oral         Complications: No apparent anesthesia complications

## 2018-11-29 ENCOUNTER — Encounter (HOSPITAL_COMMUNITY): Payer: Self-pay | Admitting: Orthopaedic Surgery

## 2018-11-29 ENCOUNTER — Telehealth: Payer: Self-pay | Admitting: *Deleted

## 2018-11-29 DIAGNOSIS — R2689 Other abnormalities of gait and mobility: Secondary | ICD-10-CM | POA: Diagnosis not present

## 2018-11-29 DIAGNOSIS — K210 Gastro-esophageal reflux disease with esophagitis: Secondary | ICD-10-CM | POA: Diagnosis not present

## 2018-11-29 DIAGNOSIS — J449 Chronic obstructive pulmonary disease, unspecified: Secondary | ICD-10-CM | POA: Diagnosis not present

## 2018-11-29 DIAGNOSIS — G959 Disease of spinal cord, unspecified: Secondary | ICD-10-CM | POA: Diagnosis not present

## 2018-11-29 DIAGNOSIS — Z79899 Other long term (current) drug therapy: Secondary | ICD-10-CM | POA: Diagnosis not present

## 2018-11-29 DIAGNOSIS — R64 Cachexia: Secondary | ICD-10-CM | POA: Diagnosis not present

## 2018-11-29 DIAGNOSIS — M1611 Unilateral primary osteoarthritis, right hip: Secondary | ICD-10-CM | POA: Diagnosis not present

## 2018-11-29 DIAGNOSIS — E039 Hypothyroidism, unspecified: Secondary | ICD-10-CM | POA: Diagnosis not present

## 2018-11-29 DIAGNOSIS — Z6824 Body mass index (BMI) 24.0-24.9, adult: Secondary | ICD-10-CM | POA: Diagnosis not present

## 2018-11-29 DIAGNOSIS — I10 Essential (primary) hypertension: Secondary | ICD-10-CM | POA: Diagnosis not present

## 2018-11-29 DIAGNOSIS — Z86010 Personal history of colonic polyps: Secondary | ICD-10-CM | POA: Diagnosis not present

## 2018-11-29 DIAGNOSIS — G709 Myoneural disorder, unspecified: Secondary | ICD-10-CM | POA: Diagnosis not present

## 2018-11-29 DIAGNOSIS — Z7982 Long term (current) use of aspirin: Secondary | ICD-10-CM | POA: Diagnosis not present

## 2018-11-29 DIAGNOSIS — K449 Diaphragmatic hernia without obstruction or gangrene: Secondary | ICD-10-CM | POA: Diagnosis not present

## 2018-11-29 DIAGNOSIS — Z87891 Personal history of nicotine dependence: Secondary | ICD-10-CM | POA: Diagnosis not present

## 2018-11-29 DIAGNOSIS — M24851 Other specific joint derangements of right hip, not elsewhere classified: Secondary | ICD-10-CM | POA: Diagnosis not present

## 2018-11-29 DIAGNOSIS — M25751 Osteophyte, right hip: Secondary | ICD-10-CM | POA: Diagnosis not present

## 2018-11-29 DIAGNOSIS — E785 Hyperlipidemia, unspecified: Secondary | ICD-10-CM | POA: Diagnosis not present

## 2018-11-29 DIAGNOSIS — D539 Nutritional anemia, unspecified: Secondary | ICD-10-CM | POA: Diagnosis not present

## 2018-11-29 DIAGNOSIS — E7800 Pure hypercholesterolemia, unspecified: Secondary | ICD-10-CM | POA: Diagnosis not present

## 2018-11-29 MED ORDER — ASPIRIN 81 MG PO TABS: 81.0000 mg | ORAL_TABLET | Freq: Two times a day (BID) | ORAL | 0 refills | Status: AC

## 2018-11-29 MED ORDER — OXYCODONE-ACETAMINOPHEN 5-325 MG PO TABS: 1.0000 | ORAL_TABLET | Freq: Four times a day (QID) | ORAL | 0 refills | Status: DC | PRN

## 2018-11-29 NOTE — Telephone Encounter (Signed)
Pt was on TCM report admitted 11/28/18 for Primary localized osteoarthritis of right hip. Pt underwent RIght Anterior Hip Arthroplasty. Patient benefited maximally from hospital stay and there were no complications, and was D/C 11/29/18 and will f/u w/Dr Vernard Gambles in 2 weeks.Marland KitchenJohny Chess
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
## 2018-11-29 NOTE — Progress Notes (Signed)
Subjective: 1 Day Post-Op Procedure(s) (LRB): RIght Anterior Hip Arthroplasty (Right)   Patient doing well. He has no significant pain. He is looking forward to going home.  Activity level:  wbat Diet tolerance:  ok Voiding:  ok Patient reports pain as mild.    Objective: Vital signs in last 24 hours: Temp:  [97.5 F (36.4 C)-98.2 F (36.8 C)] 97.7 F (36.5 C) (08/05 0507) Pulse Rate:  [45-63] 59 (08/05 0507) Resp:  [9-20] 14 (08/05 0507) BP: (91-135)/(58-93) 107/75 (08/05 0507) SpO2:  [97 %-100 %] 100 % (08/05 0507) Weight:  [71.7 kg] 71.7 kg (08/04 1030)  Labs: Recent Labs    11/27/18 1527  HGB 14.5   Recent Labs    11/27/18 1527  WBC 6.8  RBC 4.70  HCT 44.5  PLT 275   Recent Labs    11/27/18 1527  NA 140  K 3.8  CL 101  CO2 29  BUN 22  CREATININE 0.93  GLUCOSE 106*  CALCIUM 9.9   Recent Labs    11/27/18 1527  INR 1.1    Physical Exam:  Neurologically intact ABD soft Neurovascular intact Sensation intact distally Intact pulses distally Dorsiflexion/Plantar flexion intact Incision: dressing C/D/I and no drainage No cellulitis present Compartment soft  Assessment/Plan:  1 Day Post-Op Procedure(s) (LRB): RIght Anterior Hip Arthroplasty (Right) Advance diet Up with therapy Discharge home with home health after PT today. Continue on ASA 81mg  BID x 4 weeks post op. Follow up in office 2 weeks post op.    Curtis Vance 11/29/2018, 7:30 AM

## 2018-11-29 NOTE — TOC Transition Note (Signed)
Transition of Care [facility]) - CM/SW Discharge Note   Patient Details  Name: Curtis Vance MRN: [id number] Date of Birth: 11/17/1943  [facility] of [facility]) CM/SW Contact:  Lia Hopping, LCSW Phone Number: 11/29/2018, 10:46 AM   Clinical Narrative:    Therapy Plan: Home Health PT-[facility] 3 in 1 and RW ordered through [place] and delivered to the patient room    Final next level of care: [place] Barriers to Discharge: No Barriers Identified   Patient Goals and CMS Choice Patient states their goals for this hospitalization and ongoing recovery are:: Recover at Home with therapy CMS Medicare.gov Compare Post Acute Care list provided to:: Patient Choice offered to / list presented to : Patient  Discharge Placement                       Discharge Plan and Services                DME Arranged: 3-N-1, Walker rolling DME Agency: [employer] Date DME Agency Contacted: 11/29/18 Time DME Agency Contacted: 0900 Representative spoke with at DME Agency: Ovid Curd HH Arranged: PT [place]: [facility] (formerly [employer]) Date [place]: 11/29/18 Time [place]: [id number] Representative spoke with at [place]: [place] ([place]) Interventions     Readmission Risk Interventions No flowsheet data found.

## 2018-11-29 NOTE — Progress Notes (Signed)
The pt was provided with d/c instructions. After discussing the pt's plan of care upon d/c home, the pt reported no further questions or concerns. Pt waiting for ride to arrive.

## 2018-11-29 NOTE — Progress Notes (Signed)
Physical Therapy Treatment Patient Details Name: Curtis Vance MRN: [id number] DOB: 11/17/1943 Today's Date: 11/29/2018    History of Present Illness R DA-THA; PMH of cervical fusion, COPD, HTN    PT Comments    Pt has met PT goals and is ready to DC home from PT standpoint. He ambulated 150' with RW and demonstrates good understanding of THA HEP.   Follow Up Recommendations  Follow surgeon's recommendation for DC plan and follow-up therapies     Equipment Recommendations  Rolling walker with 5" wheels;3in1 (PT)    Recommendations for Other Services       Precautions / Restrictions Precautions Precautions: Fall Restrictions Weight Bearing Restrictions: No Other Position/Activity Restrictions: WBAT    Mobility  Bed Mobility               General bed mobility comments: up in bed  Transfers Overall transfer level: Needs assistance Equipment used: Rolling walker (2 wheeled) Transfers: Sit to/from Stand Sit to Stand: Supervision         General transfer comment: VCs hand placement  Ambulation/Gait Ambulation/Gait assistance: Modified independent (Device/Increase time) Gait Distance (Feet): 150 Feet Assistive device: Rolling walker (2 wheeled) Gait Pattern/deviations: Step-to pattern;Decreased stride length;Trunk flexed Gait velocity: decr   General Gait Details: VCs sequencing and posture, no loss of balance   Stairs             Wheelchair Mobility    Modified Rankin (Stroke Patients Only)       Balance Overall balance assessment: Modified Independent                                          Cognition Arousal/Alertness: Awake/alert Behavior During Therapy: WFL for tasks assessed/performed Overall Cognitive Status: Within Functional Limits for tasks assessed                                        Exercises Total Joint Exercises Ankle Circles/Pumps: AROM;10 reps;Both;Supine Quad Sets: AROM;Right;5  reps;Supine Short Arc Quad: AROM;Right;10 reps;Supine Heel Slides: AAROM;Right;10 reps;Supine;AROM Hip ABduction/ADduction: AROM;AAROM;Right;10 reps;Supine Long Arc Quad: AROM;Right;Seated;10 reps    General Comments        Pertinent Vitals/Pain Pain Score: 2  Pain Location: R hip Pain Descriptors / Indicators: Sore Pain Intervention(s): Limited activity within patient's tolerance;Monitored during session;Premedicated before session;Ice applied    Home Living                      Prior Function            PT Goals (current goals can now be found in the care plan section) Acute Rehab PT Goals Patient Stated Goal: do yardwork, care for wife who is in poor health Time For Goal Achievement: 12/05/18 Potential to Achieve Goals: Good Progress towards PT goals: Goals met/education completed, patient discharged from PT    Frequency    7X/week      PT Plan Current plan remains appropriate    Co-evaluation              AM-PAC PT "6 Clicks" Mobility   Outcome Measure  Help needed turning from your back to your side while in a flat bed without using bedrails?: None Help needed moving from lying on your back to sitting on the side of  a flat bed without using bedrails?: None Help needed moving to and from a bed to a chair (including a wheelchair)?: None Help needed standing up from a chair using your arms (e.g., wheelchair or bedside chair)?: None Help needed to walk in hospital room?: None Help needed climbing 3-5 steps with a railing? : A Little 6 Click Score: 23    End of Session Equipment Utilized During Treatment: Gait belt Activity Tolerance: Patient tolerated treatment well;No increased pain Patient left: in chair;with call bell/phone within reach Nurse Communication: Mobility status PT Visit Diagnosis: Muscle weakness (generalized) (M62.81);Difficulty in walking, not elsewhere classified (R26.2)     Time: 4709-6283 PT Time Calculation (min)  (ACUTE ONLY): 23 min  Charges:  $Gait Training: 8-22 mins $Therapeutic Exercise: 8-22 mins                    [name] PT 11/29/2018  Acute Rehabilitation Services Pager [phone] Office [phone]

## 2018-12-02 DIAGNOSIS — Z96641 Presence of right artificial hip joint: Secondary | ICD-10-CM | POA: Diagnosis not present

## 2018-12-02 DIAGNOSIS — G959 Disease of spinal cord, unspecified: Secondary | ICD-10-CM | POA: Diagnosis not present

## 2018-12-02 DIAGNOSIS — Z86010 Personal history of colonic polyps: Secondary | ICD-10-CM | POA: Diagnosis not present

## 2018-12-02 DIAGNOSIS — K449 Diaphragmatic hernia without obstruction or gangrene: Secondary | ICD-10-CM | POA: Diagnosis not present

## 2018-12-02 DIAGNOSIS — I10 Essential (primary) hypertension: Secondary | ICD-10-CM | POA: Diagnosis not present

## 2018-12-02 DIAGNOSIS — M47814 Spondylosis without myelopathy or radiculopathy, thoracic region: Secondary | ICD-10-CM | POA: Diagnosis not present

## 2018-12-02 DIAGNOSIS — M5010 Cervical disc disorder with radiculopathy, unspecified cervical region: Secondary | ICD-10-CM | POA: Diagnosis not present

## 2018-12-02 DIAGNOSIS — Z471 Aftercare following joint replacement surgery: Secondary | ICD-10-CM | POA: Diagnosis not present

## 2018-12-02 DIAGNOSIS — G3184 Mild cognitive impairment, so stated: Secondary | ICD-10-CM | POA: Diagnosis not present

## 2018-12-02 DIAGNOSIS — Z87891 Personal history of nicotine dependence: Secondary | ICD-10-CM | POA: Diagnosis not present

## 2018-12-02 DIAGNOSIS — G8192 Hemiplegia, unspecified affecting left dominant side: Secondary | ICD-10-CM | POA: Diagnosis not present

## 2018-12-02 DIAGNOSIS — Z9181 History of falling: Secondary | ICD-10-CM | POA: Diagnosis not present

## 2018-12-02 DIAGNOSIS — J449 Chronic obstructive pulmonary disease, unspecified: Secondary | ICD-10-CM | POA: Diagnosis not present

## 2018-12-02 DIAGNOSIS — K2270 Barrett's esophagus without dysplasia: Secondary | ICD-10-CM | POA: Diagnosis not present

## 2018-12-04 DIAGNOSIS — I10 Essential (primary) hypertension: Secondary | ICD-10-CM | POA: Diagnosis not present

## 2018-12-04 DIAGNOSIS — K449 Diaphragmatic hernia without obstruction or gangrene: Secondary | ICD-10-CM | POA: Diagnosis not present

## 2018-12-04 DIAGNOSIS — G959 Disease of spinal cord, unspecified: Secondary | ICD-10-CM | POA: Diagnosis not present

## 2018-12-04 DIAGNOSIS — G3184 Mild cognitive impairment, so stated: Secondary | ICD-10-CM | POA: Diagnosis not present

## 2018-12-04 DIAGNOSIS — J449 Chronic obstructive pulmonary disease, unspecified: Secondary | ICD-10-CM | POA: Diagnosis not present

## 2018-12-04 DIAGNOSIS — G8192 Hemiplegia, unspecified affecting left dominant side: Secondary | ICD-10-CM | POA: Diagnosis not present

## 2018-12-04 DIAGNOSIS — Z87891 Personal history of nicotine dependence: Secondary | ICD-10-CM | POA: Diagnosis not present

## 2018-12-04 DIAGNOSIS — M5010 Cervical disc disorder with radiculopathy, unspecified cervical region: Secondary | ICD-10-CM | POA: Diagnosis not present

## 2018-12-04 DIAGNOSIS — K2270 Barrett's esophagus without dysplasia: Secondary | ICD-10-CM | POA: Diagnosis not present

## 2018-12-04 DIAGNOSIS — Z471 Aftercare following joint replacement surgery: Secondary | ICD-10-CM | POA: Diagnosis not present

## 2018-12-04 DIAGNOSIS — Z86010 Personal history of colonic polyps: Secondary | ICD-10-CM | POA: Diagnosis not present

## 2018-12-04 DIAGNOSIS — M47814 Spondylosis without myelopathy or radiculopathy, thoracic region: Secondary | ICD-10-CM | POA: Diagnosis not present

## 2018-12-04 DIAGNOSIS — Z9181 History of falling: Secondary | ICD-10-CM | POA: Diagnosis not present

## 2018-12-04 DIAGNOSIS — Z96641 Presence of right artificial hip joint: Secondary | ICD-10-CM | POA: Diagnosis not present

## 2018-12-06 DIAGNOSIS — Z87891 Personal history of nicotine dependence: Secondary | ICD-10-CM | POA: Diagnosis not present

## 2018-12-06 DIAGNOSIS — Z9181 History of falling: Secondary | ICD-10-CM | POA: Diagnosis not present

## 2018-12-06 DIAGNOSIS — M47814 Spondylosis without myelopathy or radiculopathy, thoracic region: Secondary | ICD-10-CM | POA: Diagnosis not present

## 2018-12-06 DIAGNOSIS — I10 Essential (primary) hypertension: Secondary | ICD-10-CM | POA: Diagnosis not present

## 2018-12-06 DIAGNOSIS — K2270 Barrett's esophagus without dysplasia: Secondary | ICD-10-CM | POA: Diagnosis not present

## 2018-12-06 DIAGNOSIS — K449 Diaphragmatic hernia without obstruction or gangrene: Secondary | ICD-10-CM | POA: Diagnosis not present

## 2018-12-06 DIAGNOSIS — G3184 Mild cognitive impairment, so stated: Secondary | ICD-10-CM | POA: Diagnosis not present

## 2018-12-06 DIAGNOSIS — Z471 Aftercare following joint replacement surgery: Secondary | ICD-10-CM | POA: Diagnosis not present

## 2018-12-06 DIAGNOSIS — J449 Chronic obstructive pulmonary disease, unspecified: Secondary | ICD-10-CM | POA: Diagnosis not present

## 2018-12-06 DIAGNOSIS — Z96641 Presence of right artificial hip joint: Secondary | ICD-10-CM | POA: Diagnosis not present

## 2018-12-06 DIAGNOSIS — Z86010 Personal history of colonic polyps: Secondary | ICD-10-CM | POA: Diagnosis not present

## 2018-12-06 DIAGNOSIS — G8192 Hemiplegia, unspecified affecting left dominant side: Secondary | ICD-10-CM | POA: Diagnosis not present

## 2018-12-06 DIAGNOSIS — M5010 Cervical disc disorder with radiculopathy, unspecified cervical region: Secondary | ICD-10-CM | POA: Diagnosis not present

## 2018-12-06 DIAGNOSIS — G959 Disease of spinal cord, unspecified: Secondary | ICD-10-CM | POA: Diagnosis not present

## 2018-12-07 DIAGNOSIS — I10 Essential (primary) hypertension: Secondary | ICD-10-CM | POA: Diagnosis not present

## 2018-12-07 DIAGNOSIS — Z86010 Personal history of colonic polyps: Secondary | ICD-10-CM | POA: Diagnosis not present

## 2018-12-07 DIAGNOSIS — Z87891 Personal history of nicotine dependence: Secondary | ICD-10-CM | POA: Diagnosis not present

## 2018-12-07 DIAGNOSIS — K449 Diaphragmatic hernia without obstruction or gangrene: Secondary | ICD-10-CM | POA: Diagnosis not present

## 2018-12-07 DIAGNOSIS — Z9181 History of falling: Secondary | ICD-10-CM | POA: Diagnosis not present

## 2018-12-07 DIAGNOSIS — J449 Chronic obstructive pulmonary disease, unspecified: Secondary | ICD-10-CM | POA: Diagnosis not present

## 2018-12-07 DIAGNOSIS — Z96641 Presence of right artificial hip joint: Secondary | ICD-10-CM | POA: Diagnosis not present

## 2018-12-07 DIAGNOSIS — G3184 Mild cognitive impairment, so stated: Secondary | ICD-10-CM | POA: Diagnosis not present

## 2018-12-07 DIAGNOSIS — G8192 Hemiplegia, unspecified affecting left dominant side: Secondary | ICD-10-CM | POA: Diagnosis not present

## 2018-12-07 DIAGNOSIS — M47814 Spondylosis without myelopathy or radiculopathy, thoracic region: Secondary | ICD-10-CM | POA: Diagnosis not present

## 2018-12-07 DIAGNOSIS — G959 Disease of spinal cord, unspecified: Secondary | ICD-10-CM | POA: Diagnosis not present

## 2018-12-07 DIAGNOSIS — Z471 Aftercare following joint replacement surgery: Secondary | ICD-10-CM | POA: Diagnosis not present

## 2018-12-07 DIAGNOSIS — K2270 Barrett's esophagus without dysplasia: Secondary | ICD-10-CM | POA: Diagnosis not present

## 2018-12-07 DIAGNOSIS — M5010 Cervical disc disorder with radiculopathy, unspecified cervical region: Secondary | ICD-10-CM | POA: Diagnosis not present

## 2018-12-08 DIAGNOSIS — M25511 Pain in right shoulder: Secondary | ICD-10-CM | POA: Diagnosis not present

## 2018-12-13 DIAGNOSIS — J449 Chronic obstructive pulmonary disease, unspecified: Secondary | ICD-10-CM | POA: Diagnosis not present

## 2018-12-13 DIAGNOSIS — G8192 Hemiplegia, unspecified affecting left dominant side: Secondary | ICD-10-CM | POA: Diagnosis not present

## 2018-12-13 DIAGNOSIS — Z86010 Personal history of colonic polyps: Secondary | ICD-10-CM | POA: Diagnosis not present

## 2018-12-13 DIAGNOSIS — Z87891 Personal history of nicotine dependence: Secondary | ICD-10-CM | POA: Diagnosis not present

## 2018-12-13 DIAGNOSIS — Z471 Aftercare following joint replacement surgery: Secondary | ICD-10-CM | POA: Diagnosis not present

## 2018-12-13 DIAGNOSIS — K2270 Barrett's esophagus without dysplasia: Secondary | ICD-10-CM | POA: Diagnosis not present

## 2018-12-13 DIAGNOSIS — Z96641 Presence of right artificial hip joint: Secondary | ICD-10-CM | POA: Diagnosis not present

## 2018-12-13 DIAGNOSIS — M5010 Cervical disc disorder with radiculopathy, unspecified cervical region: Secondary | ICD-10-CM | POA: Diagnosis not present

## 2018-12-13 DIAGNOSIS — K449 Diaphragmatic hernia without obstruction or gangrene: Secondary | ICD-10-CM | POA: Diagnosis not present

## 2018-12-13 DIAGNOSIS — G3184 Mild cognitive impairment, so stated: Secondary | ICD-10-CM | POA: Diagnosis not present

## 2018-12-13 DIAGNOSIS — G959 Disease of spinal cord, unspecified: Secondary | ICD-10-CM | POA: Diagnosis not present

## 2018-12-13 DIAGNOSIS — Z9181 History of falling: Secondary | ICD-10-CM | POA: Diagnosis not present

## 2018-12-13 DIAGNOSIS — I10 Essential (primary) hypertension: Secondary | ICD-10-CM | POA: Diagnosis not present

## 2018-12-13 DIAGNOSIS — M47814 Spondylosis without myelopathy or radiculopathy, thoracic region: Secondary | ICD-10-CM | POA: Diagnosis not present

## 2018-12-18 DIAGNOSIS — R040 Epistaxis: Secondary | ICD-10-CM | POA: Diagnosis not present

## 2018-12-27 ENCOUNTER — Other Ambulatory Visit: Payer: Self-pay

## 2018-12-29 ENCOUNTER — Encounter: Payer: Self-pay | Admitting: Endocrinology

## 2018-12-29 ENCOUNTER — Other Ambulatory Visit: Payer: Self-pay

## 2018-12-29 ENCOUNTER — Ambulatory Visit (INDEPENDENT_AMBULATORY_CARE_PROVIDER_SITE_OTHER): Payer: Medicare Other | Admitting: Endocrinology

## 2018-12-29 VITALS — BP 140/100 | HR 82 | Temp 98.1°F | Ht 68.0 in | Wt 157.8 lb

## 2018-12-29 DIAGNOSIS — E890 Postprocedural hypothyroidism: Secondary | ICD-10-CM

## 2018-12-29 NOTE — Patient Instructions (Addendum)
Your blood pressure is high today.  Please see your primary care provider soon, to have it rechecked. Thyroid blood tests are requested for you today.  We'll let you know about the results.   Please come back for a follow-up appointment in 6 months.
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
## 2018-12-30 LAB — TSH: TSH: 0.236 u[IU]/mL — ABNORMAL LOW (ref 0.450–4.500)

## 2018-12-30 LAB — T4, FREE: Free T4: 1.62 ng/dL (ref 0.82–1.77)

## 2018-12-30 MED ORDER — LEVOTHYROXINE SODIUM 88 MCG PO TABS: 88.0000 ug | ORAL_TABLET | Freq: Every day | ORAL | 1 refills | Status: DC

## 2019-01-04 DIAGNOSIS — M25552 Pain in left hip: Secondary | ICD-10-CM | POA: Diagnosis not present

## 2019-01-04 DIAGNOSIS — M1612 Unilateral primary osteoarthritis, left hip: Secondary | ICD-10-CM | POA: Diagnosis not present

## 2019-01-16 ENCOUNTER — Other Ambulatory Visit: Payer: Self-pay | Admitting: Internal Medicine

## 2019-01-16 DIAGNOSIS — I10 Essential (primary) hypertension: Secondary | ICD-10-CM

## 2019-01-25 ENCOUNTER — Ambulatory Visit: Payer: Medicare Other | Admitting: Internal Medicine

## 2019-01-29 ENCOUNTER — Encounter: Payer: Self-pay | Admitting: Internal Medicine

## 2019-01-29 ENCOUNTER — Ambulatory Visit (INDEPENDENT_AMBULATORY_CARE_PROVIDER_SITE_OTHER): Payer: Medicare Other | Admitting: Internal Medicine

## 2019-01-29 ENCOUNTER — Other Ambulatory Visit: Payer: Self-pay

## 2019-01-29 ENCOUNTER — Other Ambulatory Visit (INDEPENDENT_AMBULATORY_CARE_PROVIDER_SITE_OTHER): Payer: Medicare Other

## 2019-01-29 VITALS — BP 130/74 | HR 85 | Temp 98.6°F | Resp 16 | Ht 68.0 in | Wt 156.8 lb

## 2019-01-29 DIAGNOSIS — I10 Essential (primary) hypertension: Secondary | ICD-10-CM | POA: Diagnosis not present

## 2019-01-29 DIAGNOSIS — Z23 Encounter for immunization: Secondary | ICD-10-CM

## 2019-01-29 DIAGNOSIS — R64 Cachexia: Secondary | ICD-10-CM | POA: Diagnosis not present

## 2019-01-29 DIAGNOSIS — E785 Hyperlipidemia, unspecified: Secondary | ICD-10-CM | POA: Diagnosis not present

## 2019-01-29 DIAGNOSIS — N5201 Erectile dysfunction due to arterial insufficiency: Secondary | ICD-10-CM | POA: Diagnosis not present

## 2019-01-29 LAB — LIPID PANEL
Cholesterol: 160 mg/dL (ref 0–200)
HDL: 56 mg/dL (ref >39.00)
LDL Cholesterol: 78 mg/dL (ref 0–99)
NonHDL: 103.59
Total CHOL/HDL Ratio: 3
Triglycerides: 126 mg/dL (ref 0.0–149.0)
VLDL: 25.2 mg/dL (ref 0.0–40.0)

## 2019-01-29 MED ORDER — DRONABINOL 2.5 MG PO CAPS: 2.5000 mg | ORAL_CAPSULE | Freq: Two times a day (BID) | ORAL | 0 refills | Status: DC

## 2019-01-29 MED ORDER — SILDENAFIL CITRATE 20 MG PO TABS: 80.0000 mg | ORAL_TABLET | Freq: Every day | ORAL | 11 refills | Status: DC | PRN

## 2019-01-29 NOTE — Patient Instructions (Signed)

## 2019-01-29 NOTE — Progress Notes (Signed)
Subjective:  Patient ID: DEVAL MROCZKA, male    DOB: 11/17/1943  Age: 73 y.o. MRN: [id number]  CC: Hypertension and Hyperlipidemia   HPI MALEAK BRAZZEL presents for a CPX.  He complains of poor appetite and inability to gain weight.  He is lost another 2 pounds over the last few months.  He denies nausea, vomiting, odynophagia, dysphagia, abdominal pain, early satiety, diarrhea, constipation, melena, or bright red blood per rectum.  He wants a prescription for generic Viagra.  Outpatient Medications Prior to Visit  Medication Sig Dispense Refill  . acidophilus (RISAQUAD) CAPS capsule Take 1 capsule by mouth daily.    Marland Kitchen albuterol (PROAIR HFA) 108 (90 Base) MCG/ACT inhaler Inhale 2 puffs into the lungs every 6 (six) hours as needed for wheezing or shortness of breath. 18 g 0  . Ascorbic Acid (VITAMIN C) 1000 MG tablet Take 1,000 mg by mouth daily.    Marland Kitchen aspirin 81 MG tablet Take 1 tablet (81 mg total) by mouth 2 (two) times daily at 10 AM and 5 PM. 60 tablet 0  . Besifloxacin HCl (BESIVANCE) 0.6 % SUSP Apply to eye.    . Bromfenac Sodium (PROLENSA) 0.07 % SOLN Apply to eye.    . chlorthalidone (HYGROTON) 25 MG tablet TAKE 1 TABLET BY MOUTH EVERY DAY 90 tablet 0  . cloNIDine (CATAPRES - DOSED IN MG/24 HR) 0.3 mg/24hr patch PLACE 1 PATCH ONCE WEEKLY ONTO THE SKIN (Patient taking differently: Place 0.3 mg onto the skin once a week. ) 12 patch 1  . cloNIDine (CATAPRES) 0.1 MG tablet TAKE 1 TABLET (0.1 MG TOTAL) BY MOUTH 3 (THREE) TIMES DAILY. (Patient taking differently: Take 0.1 mg by mouth daily as needed (blood pressure of 180/100). ) 270 tablet 1  . Coenzyme Q10 (CO Q-10) 200 MG CAPS Take 200 mg by mouth 2 (two) times a day.    . Difluprednate (DUREZOL) 0.05 % EMUL Apply to eye.    . ferrous sulfate 325 (65 FE) MG tablet Take 1 tablet (325 mg total) by mouth daily with breakfast. 90 tablet 1  . gabapentin (NEURONTIN) 100 MG capsule TAKE 2 CAPSULES (200 MG TOTAL) BY MOUTH AT BEDTIME.  180 capsule 2  . Glucosamine HCl 1000 MG TABS Take 1,000 mg by mouth daily.    . Glycopyrrolate (LONHALA MAGNAIR REFILL KIT) 25 MCG/ML SOLN Inhale 1 Act into the lungs 2 (two) times daily. 60 mL 11  . KLOR-CON M20 20 MEQ tablet TAKE 1 TABLET (20 MEQ TOTAL) BY MOUTH DAILY. (Patient taking differently: Take 20 mEq by mouth daily as needed (cramping). ) 90 tablet 1  . levothyroxine (SYNTHROID) 88 MCG tablet Take 1 tablet (88 mcg total) by mouth daily before breakfast. 90 tablet 1  . LONHALA MAGNAIR STARTER KIT 25 MCG/ML SOLN Use one vial via Magnair device twice a day. Generic: Glycopyrrolate 60 mL 11  . methocarbamol (ROBAXIN-750) 750 MG tablet Take 1 tablet (750 mg total) by mouth 3 (three) times daily as needed for muscle spasms. 90 tablet 3  . Multiple Vitamin (MULTIVITAMIN WITH MINERALS) TABS tablet Take 1 tablet by mouth daily.    Marland Kitchen oxyCODONE-acetaminophen (PERCOCET) 5-325 MG tablet Take 1-2 tablets by mouth every 6 (six) hours as needed for severe pain. 40 tablet 0  . rosuvastatin (CRESTOR) 10 MG tablet TAKE 1 TABLET BY MOUTH EVERY DAY 90 tablet 1  . SYMBICORT 160-4.5 MCG/ACT inhaler INHALE 2 PUFFS INTO THE LUNGS EVERY 12 HOURS AS NEEDED (Patient taking differently: Inhale  2 puffs into the lungs 2 (two) times a day. ) 30.6 Inhaler 1   No facility-administered medications prior to visit.     ROS Review of Systems  Constitutional: Positive for unexpected weight change. Negative for chills, diaphoresis, fatigue and fever.  HENT: Negative.  Negative for trouble swallowing.   Eyes: Negative.   Respiratory: Negative for cough, chest tightness, shortness of breath and wheezing.   Cardiovascular: Negative for chest pain, palpitations and leg swelling.  Gastrointestinal: Negative for abdominal pain, blood in stool, constipation, diarrhea, nausea and vomiting.  Endocrine: Negative.  Negative for cold intolerance and heat intolerance.  Genitourinary: Negative.  Negative for difficulty urinating.   Musculoskeletal: Negative.  Negative for arthralgias and myalgias.  Skin: Negative.  Negative for color change, pallor and rash.  Neurological: Negative.  Negative for dizziness, weakness and light-headedness.  Hematological: Negative for adenopathy. Does not bruise/bleed easily.  Psychiatric/Behavioral: Negative.     Objective:  BP 130/74 (BP Location: Left Arm, Patient Position: Sitting, Cuff Size: Normal)   Pulse 85   Temp 98.6 F (37 C) (Oral)   Resp 16   Ht '5\' 8"'$  (1.727 m)   Wt 156 lb 12 oz (71.1 kg)   SpO2 97%   BMI 23.83 kg/m   BP Readings from Last 3 Encounters:  01/29/19 130/74  12/29/18 (!) 140/100  11/29/18 98/76    Wt Readings from Last 3 Encounters:  01/29/19 156 lb 12 oz (71.1 kg)  12/29/18 157 lb 12.8 oz (71.6 kg)  11/28/18 158 lb (71.7 kg)    Physical Exam Vitals signs reviewed.  Constitutional:      Appearance: Normal appearance.  HENT:     Nose: Nose normal.     Mouth/Throat:     Mouth: Mucous membranes are moist.     Pharynx: No posterior oropharyngeal erythema.  Eyes:     General: No scleral icterus.    Conjunctiva/sclera: Conjunctivae normal.  Neck:     Musculoskeletal: Normal range of motion and neck supple.  Cardiovascular:     Rate and Rhythm: Normal rate and regular rhythm.     Heart sounds: No murmur.  Pulmonary:     Breath sounds: No stridor. No wheezing, rhonchi or rales.  Abdominal:     General: Abdomen is flat. There is no distension.     Palpations: There is no mass.     Tenderness: There is no abdominal tenderness. There is no guarding or rebound.  Genitourinary:    Comments: GU and rectal exams were deferred at his request. Musculoskeletal: Normal range of motion.  Lymphadenopathy:     Cervical: No cervical adenopathy.  Skin:    General: Skin is warm.  Neurological:     General: No focal deficit present.     Mental Status: He is alert.  Psychiatric:        Mood and Affect: Mood normal.     Lab Results  Component  Value Date   WBC 6.8 11/27/2018   HGB 14.5 11/27/2018   HCT 44.5 11/27/2018   PLT 275 11/27/2018   GLUCOSE 106 (H) 11/27/2018   CHOL 160 01/29/2019   TRIG 126.0 01/29/2019   HDL 56.00 01/29/2019   LDLCALC 78 01/29/2019   ALT 14 02/20/2018   AST 23 02/20/2018   NA 140 11/27/2018   K 3.8 11/27/2018   CL 101 11/27/2018   CREATININE 0.93 11/27/2018   BUN 22 11/27/2018   CO2 29 11/27/2018   TSH 0.236 (L) 12/29/2018   PSA 2.8  10/26/2018   INR 1.1 11/27/2018   HGBA1C 5.1 12/15/2015    Dg Chest 2 View  Result Date: 11/27/2018 CLINICAL DATA:  Preop evaluation for upcoming hip replacement EXAM: CHEST - 2 VIEW COMPARISON:  08/31/2017 FINDINGS: Cardiac shadows within normal limits. The lungs are well aerated without focal infiltrate or sizable effusion. Degenerative changes of the thoracic spine are noted. Postsurgical changes in the cervical spine are seen. IMPRESSION: No acute abnormality noted. Electronically Signed   By: Inez Catalina M.D.   On: 11/27/2018 20:36   Dg C-arm 1-60 Min-no Report  Result Date: 11/28/2018 CLINICAL DATA:  Right hip arthroplasty EXAM: OPERATIVE right HIP (WITH PELVIS IF PERFORMED) 2 VIEWS TECHNIQUE: Fluoroscopic spot image(s) were submitted for interpretation post-operatively. COMPARISON:  12/16/2016 FINDINGS: Two intraoperative fluoroscopic images demonstrate right total hip arthroplasty hardware in its expected alignment without obvious complication. Please refer to the surgeon's operative note for further detail. 26 seconds of fluoroscopy time was used. IMPRESSION: As above. Electronically Signed   By: Davina Poke M.D.   On: 11/28/2018 14:52   Dg Hip Operative Unilat With Pelvis Right  Result Date: 11/28/2018 CLINICAL DATA:  Right hip arthroplasty EXAM: OPERATIVE right HIP (WITH PELVIS IF PERFORMED) 2 VIEWS TECHNIQUE: Fluoroscopic spot image(s) were submitted for interpretation post-operatively. COMPARISON:  12/16/2016 FINDINGS: Two intraoperative fluoroscopic  images demonstrate right total hip arthroplasty hardware in its expected alignment without obvious complication. Please refer to the surgeon's operative note for further detail. 26 seconds of fluoroscopy time was used. IMPRESSION: As above. Electronically Signed   By: Davina Poke M.D.   On: 11/28/2018 14:52    Assessment & Plan:   Lashon was seen today for hypertension, annual exam and hyperlipidemia.  Diagnoses and all orders for this visit:  Need for influenza vaccination -     Flu Vaccine QUAD High Dose(Fluad)  Essential hypertension- His blood pressure is adequately well controlled.  Hyperlipidemia with target LDL less than 130- He has achieved his LDL goal and is doing well on the statin. -     Lipid panel; Future  Cachexia ([place])- I recommended that he take dronabinol to increase his appetite this will help increase his muscle mass and reduce complications long-term. -     dronabinol (MARINOL) 2.5 MG capsule; Take 1 capsule (2.5 mg total) by mouth 2 (two) times daily before lunch and supper.  Erectile dysfunction due to arterial insufficiency -     sildenafil (REVATIO) 20 MG tablet; Take 4 tablets (80 mg total) by mouth daily as needed.  Need for pneumococcal vaccination -     Pneumococcal polysaccharide vaccine 23-valent greater than or equal to 2yo subcutaneous/IM   I am having Destin C. Orzechowski start on dronabinol and sildenafil. I am also having him maintain his vitamin C, [place]-Con M20, albuterol, Glycopyrrolate, [place] Magnair Starter Kit, gabapentin, ferrous sulfate, methocarbamol, rosuvastatin, cloNIDine, cloNIDine, Symbicort, Co Q-10, acidophilus, Glucosamine HCl, multivitamin with minerals, Besivance, Prolensa, Durezol, aspirin, oxyCODONE-acetaminophen, levothyroxine, and chlorthalidone.  Meds ordered this encounter  Medications  . dronabinol (MARINOL) 2.5 MG capsule    Sig: Take 1 capsule (2.5 mg total) by mouth 2 (two) times daily before lunch and supper.     Dispense:  180 capsule    Refill:  0  . sildenafil (REVATIO) 20 MG tablet    Sig: Take 4 tablets (80 mg total) by mouth daily as needed.    Dispense:  60 tablet    Refill:  11     Follow-up: Return in about  6 months (around 07/30/2019).  Scarlette Calico, MD

## 2019-01-30 ENCOUNTER — Telehealth: Payer: Self-pay

## 2019-01-30 ENCOUNTER — Encounter: Payer: Self-pay | Admitting: Internal Medicine

## 2019-01-30 DIAGNOSIS — M542 Cervicalgia: Secondary | ICD-10-CM | POA: Diagnosis not present

## 2019-01-30 DIAGNOSIS — G959 Disease of spinal cord, unspecified: Secondary | ICD-10-CM | POA: Diagnosis not present

## 2019-01-30 NOTE — Telephone Encounter (Signed)
Key: [id number]

## 2019-01-31 NOTE — Telephone Encounter (Signed)
PA was denied. Pt and spouse informed of same.

## 2019-02-02 ENCOUNTER — Other Ambulatory Visit: Payer: Self-pay | Admitting: Neurosurgery

## 2019-02-02 DIAGNOSIS — G959 Disease of spinal cord, unspecified: Secondary | ICD-10-CM

## 2019-02-09 ENCOUNTER — Ambulatory Visit
Admission: RE | Admit: 2019-02-09 | Discharge: 2019-02-09 | Disposition: A | Discharge status: Home / Self Care | Payer: Medicare Other | Source: Ambulatory Visit | Attending: Neurosurgery | Admitting: Neurosurgery

## 2019-02-09 DIAGNOSIS — G959 Disease of spinal cord, unspecified: Secondary | ICD-10-CM

## 2019-02-09 DIAGNOSIS — M4802 Spinal stenosis, cervical region: Secondary | ICD-10-CM | POA: Diagnosis not present

## 2019-02-13 DIAGNOSIS — G959 Disease of spinal cord, unspecified: Secondary | ICD-10-CM | POA: Diagnosis not present

## 2019-03-09 DIAGNOSIS — H1131 Conjunctival hemorrhage, right eye: Secondary | ICD-10-CM | POA: Diagnosis not present

## 2019-03-09 DIAGNOSIS — H02889 Meibomian gland dysfunction of unspecified eye, unspecified eyelid: Secondary | ICD-10-CM | POA: Diagnosis not present

## 2019-03-12 ENCOUNTER — Other Ambulatory Visit: Payer: Self-pay | Admitting: Neurosurgery

## 2019-03-12 DIAGNOSIS — G959 Disease of spinal cord, unspecified: Secondary | ICD-10-CM
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
## 2019-03-14 ENCOUNTER — Ambulatory Visit (INDEPENDENT_AMBULATORY_CARE_PROVIDER_SITE_OTHER): Payer: Medicare Other | Admitting: *Deleted

## 2019-03-14 DIAGNOSIS — Z0000 Encounter for general adult medical examination without abnormal findings: Secondary | ICD-10-CM

## 2019-04-04 ENCOUNTER — Other Ambulatory Visit: Payer: Self-pay | Admitting: Internal Medicine

## 2019-04-05 ENCOUNTER — Other Ambulatory Visit: Payer: Self-pay

## 2019-04-05 ENCOUNTER — Ambulatory Visit
Admission: RE | Admit: 2019-04-05 | Discharge: 2019-04-05 | Disposition: A | Discharge status: Home / Self Care | Payer: Medicare Other | Source: Ambulatory Visit | Attending: Neurosurgery | Admitting: Neurosurgery

## 2019-04-05 DIAGNOSIS — M4802 Spinal stenosis, cervical region: Secondary | ICD-10-CM | POA: Diagnosis not present

## 2019-04-05 DIAGNOSIS — G959 Disease of spinal cord, unspecified: Secondary | ICD-10-CM

## 2019-04-10 ENCOUNTER — Ambulatory Visit: Payer: Medicare Other | Admitting: Endocrinology

## 2019-04-10 DIAGNOSIS — H5713 Ocular pain, bilateral: Secondary | ICD-10-CM | POA: Diagnosis not present

## 2019-04-10 DIAGNOSIS — H02889 Meibomian gland dysfunction of unspecified eye, unspecified eyelid: Secondary | ICD-10-CM | POA: Diagnosis not present

## 2019-04-10 DIAGNOSIS — H5310 Unspecified subjective visual disturbances: Secondary | ICD-10-CM | POA: Diagnosis not present

## 2019-04-12 DIAGNOSIS — G959 Disease of spinal cord, unspecified: Secondary | ICD-10-CM | POA: Diagnosis not present

## 2019-04-17 ENCOUNTER — Other Ambulatory Visit: Payer: Self-pay | Admitting: Internal Medicine

## 2019-04-17 DIAGNOSIS — H532 Diplopia: Secondary | ICD-10-CM | POA: Diagnosis not present

## 2019-04-17 DIAGNOSIS — I10 Essential (primary) hypertension: Secondary | ICD-10-CM

## 2019-04-17 DIAGNOSIS — E0500 Thyrotoxicosis with diffuse goiter without thyrotoxic crisis or storm: Secondary | ICD-10-CM | POA: Diagnosis not present

## 2019-04-30 ENCOUNTER — Other Ambulatory Visit: Payer: Self-pay | Admitting: Ophthalmology

## 2019-04-30 ENCOUNTER — Encounter: Payer: Self-pay | Admitting: Internal Medicine

## 2019-04-30 ENCOUNTER — Ambulatory Visit (INDEPENDENT_AMBULATORY_CARE_PROVIDER_SITE_OTHER): Payer: Medicare Other | Admitting: Internal Medicine

## 2019-04-30 ENCOUNTER — Other Ambulatory Visit: Payer: Self-pay

## 2019-04-30 VITALS — BP 158/96 | HR 101 | Temp 97.7°F | Resp 16 | Ht 68.0 in | Wt 156.2 lb

## 2019-04-30 DIAGNOSIS — E890 Postprocedural hypothyroidism: Secondary | ICD-10-CM | POA: Diagnosis not present

## 2019-04-30 DIAGNOSIS — H532 Diplopia: Secondary | ICD-10-CM

## 2019-04-30 DIAGNOSIS — I10 Essential (primary) hypertension: Secondary | ICD-10-CM | POA: Diagnosis not present

## 2019-04-30 LAB — BASIC METABOLIC PANEL
BUN: 14 mg/dL (ref 6–23)
CO2: 26 mEq/L (ref 19–32)
Calcium: 9.7 mg/dL (ref 8.4–10.5)
Chloride: 101 mEq/L (ref 96–112)
Creatinine, Ser: 0.88 mg/dL (ref 0.40–1.50)
GFR: 101.49 mL/min (ref >60.00)
Glucose, Bld: 89 mg/dL (ref 70–99)
Potassium: 3.6 mEq/L (ref 3.5–5.1)
Sodium: 137 mEq/L (ref 135–145)

## 2019-04-30 LAB — TSH: TSH: 0.13 u[IU]/mL — ABNORMAL LOW (ref 0.35–4.50)

## 2019-04-30 MED ORDER — CHLORTHALIDONE 25 MG PO TABS: 25.0000 mg | ORAL_TABLET | Freq: Every day | ORAL | 0 refills | Status: DC

## 2019-04-30 NOTE — Patient Instructions (Signed)

## 2019-04-30 NOTE — Progress Notes (Signed)
Subjective:  Patient ID: Curtis Vance, male    DOB: 11/17/1943  Age: 74 y.o. MRN: [id number]  CC: Hypertension and Hypothyroidism   HPI Curtis Vance presents for f/up - He complains of thinning eyebrows and dry skin.  He tells me that over the last month or 2 he has seen multiple eye doctors and is being treated for thyroid eye disease.  He thinks his blood pressure is well controlled but according to prescription refills he would have recently run out of chlorthalidone.  Outpatient Medications Prior to Visit  Medication Sig Dispense Refill  . acidophilus (RISAQUAD) CAPS capsule Take 1 capsule by mouth daily.    Marland Kitchen albuterol (PROAIR HFA) 108 (90 Base) MCG/ACT inhaler Inhale 2 puffs into the lungs every 6 (six) hours as needed for wheezing or shortness of breath. 18 g 0  . Ascorbic Acid (VITAMIN C) 1000 MG tablet Take 1,000 mg by mouth daily.    Marland Kitchen aspirin 81 MG tablet Take 1 tablet (81 mg total) by mouth 2 (two) times daily at 10 AM and 5 PM. 60 tablet 0  . cloNIDine (CATAPRES - DOSED IN MG/24 HR) 0.3 mg/24hr patch Place 1 patch (0.3 mg total) onto the skin once a week. 12 patch 1  . cloNIDine (CATAPRES) 0.1 MG tablet TAKE 1 TABLET (0.1 MG TOTAL) BY MOUTH 3 (THREE) TIMES DAILY. (Patient taking differently: Take 0.1 mg by mouth daily as needed (blood pressure of 180/100). ) 270 tablet 1  . dronabinol (MARINOL) 2.5 MG capsule Take 1 capsule (2.5 mg total) by mouth 2 (two) times daily before lunch and supper. 180 capsule 0  . Glucosamine HCl 1000 MG TABS Take 1,000 mg by mouth daily.    Marland Kitchen KLOR-CON M20 20 MEQ tablet TAKE 1 TABLET (20 MEQ TOTAL) BY MOUTH DAILY. (Patient taking differently: Take 20 mEq by mouth daily as needed (cramping). ) 90 tablet 1  . Multiple Vitamin (MULTIVITAMIN WITH MINERALS) TABS tablet Take 1 tablet by mouth daily.    . SYMBICORT 160-4.5 MCG/ACT inhaler INHALE 2 PUFFS INTO THE LUNGS EVERY 12 HOURS AS NEEDED 30.6 Inhaler 1  . levothyroxine (SYNTHROID) 88 MCG tablet  Take 1 tablet (88 mcg total) by mouth daily before breakfast. 90 tablet 1  . methocarbamol (ROBAXIN-750) 750 MG tablet Take 1 tablet (750 mg total) by mouth 3 (three) times daily as needed for muscle spasms. 90 tablet 3  . chlorthalidone (HYGROTON) 25 MG tablet TAKE 1 TABLET BY MOUTH EVERY DAY (Patient not taking: Reported on 04/30/2019) 90 tablet 0  . Coenzyme Q10 (CO Q-10) 200 MG CAPS Take 200 mg by mouth 2 (two) times a day.     No facility-administered medications prior to visit.    ROS Review of Systems  Constitutional: Negative for diaphoresis and fatigue.  HENT: Negative.   Eyes: Negative for visual disturbance.  Respiratory: Negative for chest tightness, shortness of breath and wheezing.   Cardiovascular: Negative for chest pain, palpitations and leg swelling.  Gastrointestinal: Negative for abdominal pain, diarrhea, nausea and vomiting.  Endocrine: Negative.   Genitourinary: Negative for difficulty urinating.  Musculoskeletal: Negative.   Skin: Negative.  Negative for color change and pallor.  Neurological: Negative for dizziness, weakness, light-headedness and headaches.  Hematological: Negative for adenopathy. Does not bruise/bleed easily.  Psychiatric/Behavioral: The patient is not hyperactive.     Objective:  BP (!) 158/96 (BP Location: Left Arm, Patient Position: Sitting, Cuff Size: Normal)   Pulse (!) 101   Temp 97.7 F (  36.5 C) (Oral)   Resp 16   Ht 5\' 8"  (1.727 m)   Wt 156 lb 4 oz (70.9 kg)   SpO2 97%   BMI 23.76 kg/m   BP Readings from Last 3 Encounters:  04/30/19 (!) 158/96  01/29/19 130/74  12/29/18 (!) 140/100    Wt Readings from Last 3 Encounters:  04/30/19 156 lb 4 oz (70.9 kg)  01/29/19 156 lb 12 oz (71.1 kg)  12/29/18 157 lb 12.8 oz (71.6 kg)    Physical Exam Vitals reviewed.  Constitutional:      Appearance: Normal appearance.  HENT:     Nose: Nose normal.     Mouth/Throat:     Mouth: Mucous membranes are moist.  Eyes:     General: No  scleral icterus.    Extraocular Movements:     Right eye: Normal extraocular motion and no nystagmus.     Left eye: Normal extraocular motion and no nystagmus.     Conjunctiva/sclera: Conjunctivae normal.     Comments: + mild proptosis  Cardiovascular:     Rate and Rhythm: Regular rhythm. Tachycardia present.     Heart sounds: No murmur.  Pulmonary:     Effort: Pulmonary effort is normal.     Breath sounds: No stridor. No wheezing, rhonchi or rales.  Abdominal:     General: Abdomen is flat. Bowel sounds are normal.     Palpations: There is no hepatomegaly or splenomegaly.     Tenderness: There is no abdominal tenderness.  Musculoskeletal:        General: Normal range of motion.     Cervical back: Neck supple.     Right lower leg: No edema.  Lymphadenopathy:     Cervical: No cervical adenopathy.  Skin:    General: Skin is warm and dry.  Neurological:     General: No focal deficit present.     Mental Status: He is alert.  Psychiatric:        Mood and Affect: Mood normal.        Behavior: Behavior normal.     Lab Results  Component Value Date   WBC 6.8 11/27/2018   HGB 14.5 11/27/2018   HCT 44.5 11/27/2018   PLT 275 11/27/2018   GLUCOSE 89 04/30/2019   CHOL 160 01/29/2019   TRIG 126.0 01/29/2019   HDL 56.00 01/29/2019   LDLCALC 78 01/29/2019   ALT 14 02/20/2018   AST 23 02/20/2018   NA 137 04/30/2019   K 3.6 04/30/2019   CL 101 04/30/2019   CREATININE 0.88 04/30/2019   BUN 14 04/30/2019   CO2 26 04/30/2019   TSH 0.13 (L) 04/30/2019   PSA 2.8 10/26/2018   INR 1.1 11/27/2018   HGBA1C 5.1 12/15/2015    MR CERVICAL SPINE WO CONTRAST  Result Date: 04/06/2019 CLINICAL DATA:  Myelopathy. EXAM: MRI CERVICAL SPINE WITHOUT CONTRAST TECHNIQUE: Multiplanar, multisequence MR imaging of the cervical spine was performed. No intravenous contrast was administered. COMPARISON:  CT cervical spine 02/09/2019, cervical spine MRI 08/02/2017 FINDINGS: Alignment: Straightening of  the expected cervical lordosis. No significant spondylolisthesis. Vertebrae: Redemonstrated solid fusion at the C3-C6 levels. ACDF hardware at C3-C4 and C5-C6. Fusion is also present at the C6-T2 levels relate to bridging anterior osteophytes. Redemonstrated sequela of C5-T1 posterior decompression with associated posterior spinal fusion construct at these levels. No marrow edema or suspicious osseous lesion. Cord: Redemonstrated foci of chronic myelomalacia within the spinal cord at the C3-C5 levels. Posterior Fossa, vertebral arteries, paraspinal tissues:  No abnormality within included portions of the posterior fossa. Flow voids preserved within the cervical vertebral arteries. Postsurgical changes to the paraspinal soft tissues. Disc levels: Severe to C2-C3 disc degeneration. Moderate disc degeneration at the C6-C7 and visualized upper thoracic levels. C1-C2: Ligamentous hypertrophy posterior to the dens as well as ligamentum flavum hypertrophy contributes to severe spinal canal stenosis with contact upon the ventral and dorsal spinal cord. C2-C3: Posterior disc osteophyte complex. Uncinate/facet hypertrophy. Severe spinal canal stenosis with contact upon the ventral and dorsal spinal cord. Moderate right with moderate/severe left neural foraminal narrowing. C3-C4: Prior ACDF. Vertebral osteophytes with facet hypertrophy. Mild relative spinal canal narrowing. Mild bilateral neural foraminal narrowing. Chronic myelomalacia. C4-C5: Prior fusion. Vertebral osteophytes with facet hypertrophy. No significant spinal canal stenosis. Mild left neural foraminal narrowing. Chronic myelomalacia. C5-C6: Prior ACDF and posterior decompression. Vertebral osteophytes and facet hypertrophy. No significant spinal canal stenosis or neural foraminal narrowing. C6-C7: Prior posterior decompression. Shallow posterior disc osteophyte complex. Facet hypertrophy. No significant spinal canal stenosis. Mild/moderate right with mild left  neural foraminal narrowing. C7-T1: Prior posterior decompression. Facet hypertrophy. No significant spinal canal stenosis. Susceptibility artifact limits evaluation of the neural foramina. Suspected moderate right and mild left neural foraminal narrowing. IMPRESSION: Redemonstrated cervical spine postoperative changes. Postoperative and osteophytic fusion at the C3-T2 levels as described. Severe C1-C2 and C2-C3 spinal canal stenosis with contact upon the dorsal and ventral spinal cord at these levels. No more than mild spinal canal stenosis at the remaining levels. Multilevel neural foraminal narrowing, greatest bilaterally at C2-C3 (moderate right, moderate/severe left) and on the right at C7-T1 (moderate). Redemonstrated foci of chronic myelomalacia at the C3 through C5 levels. Electronically Signed   By: Kellie Simmering DO   On: 04/06/2019 09:36    Assessment & Plan:   Efthimios was seen today for hypertension and hypothyroidism.  Diagnoses and all orders for this visit:  Postablative hypothyroidism- His TSH is suppressed at 0.13 and he is symptomatic.  I have asked him to not take a thyroid supplement for the next 10 days and then to restart at the lower dose of 50 mcg. -     TSH -     levothyroxine (SYNTHROID) 50 MCG tablet; Take 1 tablet (50 mcg total) by mouth daily before breakfast.  Essential hypertension- His blood pressure is not adequately well controlled.  This is likely due to noncompliance.  I have asked him to restart chlorthalidone. -     chlorthalidone (HYGROTON) 25 MG tablet; Take 1 tablet (25 mg total) by mouth daily. -     Basic metabolic panel   I have discontinued Riyansh C. Kuklinski's methocarbamol, Co Q-10, and levothyroxine. I have also changed his chlorthalidone. Additionally, I am having him start on levothyroxine. Lastly, I am having him maintain his vitamin C, Klor-Con M20, albuterol, cloNIDine, acidophilus, Glucosamine HCl, multivitamin with minerals, aspirin, dronabinol,  Symbicort, and cloNIDine.  Meds ordered this encounter  Medications  . chlorthalidone (HYGROTON) 25 MG tablet    Sig: Take 1 tablet (25 mg total) by mouth daily.    Dispense:  90 tablet    Refill:  0  . levothyroxine (SYNTHROID) 50 MCG tablet    Sig: Take 1 tablet (50 mcg total) by mouth daily before breakfast.    Dispense:  90 tablet    Refill:  0     Follow-up: Return in about 3 months (around 07/29/2019).  Scarlette Calico, MD

## 2019-05-01 ENCOUNTER — Encounter: Payer: Self-pay | Admitting: Internal Medicine

## 2019-05-01 MED ORDER — LEVOTHYROXINE SODIUM 50 MCG PO TABS: 50.0000 ug | ORAL_TABLET | Freq: Every day | ORAL | 0 refills | Status: DC

## 2019-05-09 ENCOUNTER — Other Ambulatory Visit: Payer: Medicare Other

## 2019-05-09 ENCOUNTER — Ambulatory Visit
Admission: RE | Admit: 2019-05-09 | Discharge: 2019-05-09 | Disposition: A | Discharge status: Home / Self Care | Payer: Medicare Other | Source: Ambulatory Visit | Attending: Ophthalmology | Admitting: Ophthalmology

## 2019-05-09 ENCOUNTER — Other Ambulatory Visit: Payer: Self-pay

## 2019-05-09 DIAGNOSIS — H532 Diplopia: Secondary | ICD-10-CM

## 2019-05-09 MED ORDER — IOPAMIDOL (ISOVUE-300) INJECTION 61%
75.0000 mL | Freq: Once | INTRAVENOUS | Status: AC | PRN
  Administered 2019-05-09: 75 mL via INTRAVENOUS

## 2019-06-05 ENCOUNTER — Telehealth: Payer: Self-pay

## 2019-06-05 NOTE — Telephone Encounter (Signed)
I just called and spoke to them. Informed that the papers we had did not have a place for a signature. They confirmed that PCP could sign anywhere.   Form faxed.

## 2019-06-05 NOTE — Telephone Encounter (Signed)
Ginger with [facility] calling and states that they are needing a paper signed by PCP for patient to be seen with them tomorrow. States that they faxed this form over on 05/22/2019 and 06/04/2019 and have not heard anything back. States that with his HMO plan, it needs approval by PCP or he has to pay $95. States that she needs this back by end of the dfay today (06/05/2019) or he will need to reschedule. Please advise.

## 2019-06-06 ENCOUNTER — Telehealth: Payer: Self-pay | Admitting: Internal Medicine

## 2019-06-06 NOTE — Telephone Encounter (Signed)
Dr. Zada Girt is requesting call back from Dr. Ronnald Ramp by the end of day in regard to patient at [phone].

## 2019-06-06 NOTE — Telephone Encounter (Signed)
I can call him and get MD on the phone if you would like.

## 2019-06-12 NOTE — Progress Notes (Signed)
Histology and Location: Bilateral eyes     -Not having any pain, vision is improving.  He continues to see double, not as blurry as it was.  He is not tearing as much. -Continues to have swelling. -Has not started steroids.   Pain on a scale of 0-10 is: No      SAFETY ISSUES:  Prior radiation? Thyroid  Pacemaker/ICD? No  Possible current pregnancy? n/a  Is the patient on methotrexate? No  Additional Complaints / other details:

## 2019-06-13 ENCOUNTER — Ambulatory Visit
Admission: RE | Admit: 2019-06-13 | Discharge: 2019-06-13 | Disposition: A | Discharge status: Home / Self Care | Payer: Medicare Other | Source: Ambulatory Visit | Attending: Radiation Oncology | Admitting: Radiation Oncology

## 2019-06-13 ENCOUNTER — Encounter: Payer: Self-pay | Admitting: Radiation Oncology

## 2019-06-13 ENCOUNTER — Other Ambulatory Visit: Payer: Self-pay

## 2019-06-13 VITALS — Ht 68.0 in | Wt 155.0 lb

## 2019-06-13 DIAGNOSIS — H0520 Unspecified exophthalmos: Secondary | ICD-10-CM | POA: Insufficient documentation

## 2019-06-13 DIAGNOSIS — E0500 Thyrotoxicosis with diffuse goiter without thyrotoxic crisis or storm: Secondary | ICD-10-CM
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
## 2019-06-29 ENCOUNTER — Ambulatory Visit: Payer: Medicare Other | Admitting: Endocrinology

## 2019-07-18 LAB — TSH: TSH: 33.09 — AB (ref <=5.90)

## 2019-07-30 ENCOUNTER — Other Ambulatory Visit: Payer: Self-pay

## 2019-07-30 ENCOUNTER — Encounter: Payer: Self-pay | Admitting: Internal Medicine

## 2019-07-30 ENCOUNTER — Ambulatory Visit: Payer: Medicare Other | Admitting: Internal Medicine

## 2019-07-30 ENCOUNTER — Ambulatory Visit (INDEPENDENT_AMBULATORY_CARE_PROVIDER_SITE_OTHER)
Admission: RE | Admit: 2019-07-30 | Discharge: 2019-07-30 | Disposition: A | Discharge status: Home / Self Care | Payer: Medicare Other | Source: Ambulatory Visit | Attending: Internal Medicine | Admitting: Internal Medicine

## 2019-07-30 VITALS — BP 140/80 | HR 83 | Temp 98.2°F | Ht 68.0 in | Wt 161.0 lb

## 2019-07-30 DIAGNOSIS — I739 Peripheral vascular disease, unspecified: Secondary | ICD-10-CM | POA: Diagnosis not present

## 2019-07-30 DIAGNOSIS — I10 Essential (primary) hypertension: Secondary | ICD-10-CM | POA: Diagnosis not present

## 2019-07-30 DIAGNOSIS — M79671 Pain in right foot: Secondary | ICD-10-CM | POA: Insufficient documentation

## 2019-07-30 DIAGNOSIS — M19071 Primary osteoarthritis, right ankle and foot: Secondary | ICD-10-CM

## 2019-07-30 DIAGNOSIS — E890 Postprocedural hypothyroidism: Secondary | ICD-10-CM | POA: Diagnosis not present

## 2019-07-30 DIAGNOSIS — G8929 Other chronic pain: Secondary | ICD-10-CM | POA: Diagnosis not present

## 2019-07-30 DIAGNOSIS — M1611 Unilateral primary osteoarthritis, right hip: Secondary | ICD-10-CM

## 2019-07-30 LAB — CBC WITH DIFFERENTIAL/PLATELET
Basophils Absolute: 0.1 10*3/uL (ref 0.0–0.1)
Basophils Relative: 1.3 % (ref 0.0–3.0)
Eosinophils Absolute: 0.1 10*3/uL (ref 0.0–0.7)
Eosinophils Relative: 0.9 % (ref 0.0–5.0)
HCT: 39.1 % (ref 39.0–52.0)
Hemoglobin: 13 g/dL (ref 13.0–17.0)
Lymphocytes Relative: 22.2 % (ref 12.0–46.0)
Lymphs Abs: 1.4 10*3/uL (ref 0.7–4.0)
MCHC: 33.2 g/dL (ref 30.0–36.0)
MCV: 95.9 fl (ref 78.0–100.0)
Monocytes Absolute: 0.6 10*3/uL (ref 0.1–1.0)
Monocytes Relative: 9 % (ref 3.0–12.0)
Neutro Abs: 4.3 10*3/uL (ref 1.4–7.7)
Neutrophils Relative %: 66.6 % (ref 43.0–77.0)
Platelets: 200 10*3/uL (ref 150.0–400.0)
RBC: 4.08 Mil/uL — ABNORMAL LOW (ref 4.22–5.81)
RDW: 14.5 % (ref 11.5–15.5)
WBC: 6.4 10*3/uL (ref 4.0–10.5)

## 2019-07-30 LAB — BASIC METABOLIC PANEL
BUN: 14 mg/dL (ref 6–23)
CO2: 29 mEq/L (ref 19–32)
Calcium: 9.1 mg/dL (ref 8.4–10.5)
Chloride: 102 mEq/L (ref 96–112)
Creatinine, Ser: 1.01 mg/dL (ref 0.40–1.50)
GFR: 86.51 mL/min (ref >60.00)
Glucose, Bld: 100 mg/dL — ABNORMAL HIGH (ref 70–99)
Potassium: 3.8 mEq/L (ref 3.5–5.1)
Sodium: 137 mEq/L (ref 135–145)

## 2019-07-30 LAB — URIC ACID: Uric Acid, Serum: 5.2 mg/dL (ref 4.0–7.8)

## 2019-07-30 LAB — SEDIMENTATION RATE: Sed Rate: 21 mm/hr — ABNORMAL HIGH (ref 0–20)

## 2019-07-30 NOTE — Patient Instructions (Signed)
Place foot pain patient instructions here.

## 2019-07-30 NOTE — Progress Notes (Signed)
Subjective:  Patient ID: Curtis Vance, male    DOB: 11/17/1943  Age: 74 y.o. MRN: [id number]  CC: Hypertension and Hypothyroidism  This visit occurred during the SARS-CoV-2 public health emergency.  Safety protocols were in place, including screening questions prior to the visit, additional usage of staff PPE, and extensive cleaning of exam room while observing appropriate contact time as indicated for disinfecting solutions.    HPI Curtis Vance presents for concerns about his right foot.  He complains of a 1 month history of pain, redness, and swelling in his right foot.  There is no history of trauma.  It looks like someone else has prescribed methylprednisolone which is not helping much.  He has a history of osteoarthritis in other joints.  He cannot tell me whether or not there is a component of claudication.  Outpatient Medications Prior to Visit  Medication Sig Dispense Refill  . acidophilus (RISAQUAD) CAPS capsule Take 1 capsule by mouth daily.    Marland Kitchen albuterol (PROAIR HFA) 108 (90 Base) MCG/ACT inhaler Inhale 2 puffs into the lungs every 6 (six) hours as needed for wheezing or shortness of breath. 18 g 0  . Ascorbic Acid (VITAMIN C) 1000 MG tablet Take 1,000 mg by mouth daily.    Marland Kitchen aspirin 81 MG tablet Take 1 tablet (81 mg total) by mouth 2 (two) times daily at 10 AM and 5 PM. 60 tablet 0  . Calcium Carbonate-Vitamin D3 600-400 MG-UNIT TABS Take 1 tablet by mouth daily.    . chlorthalidone (HYGROTON) 25 MG tablet Take 1 tablet (25 mg total) by mouth daily. 90 tablet 0  . cloNIDine (CATAPRES - DOSED IN MG/24 HR) 0.3 mg/24hr patch Place 1 patch (0.3 mg total) onto the skin once a week. 12 patch 1  . cloNIDine (CATAPRES) 0.1 MG tablet TAKE 1 TABLET (0.1 MG TOTAL) BY MOUTH 3 (THREE) TIMES DAILY. (Patient taking differently: Take 0.1 mg by mouth daily as needed (blood pressure of 180/100). ) 270 tablet 1  . KLOR-CON M20 20 MEQ tablet TAKE 1 TABLET (20 MEQ TOTAL) BY MOUTH DAILY.  (Patient taking differently: Take 20 mEq by mouth daily as needed (cramping). ) 90 tablet 1  . levothyroxine (SYNTHROID) 75 MCG tablet Take 75 mcg by mouth every morning.    . methylPREDNISolone (MEDROL DOSEPAK) 4 MG TBPK tablet TAKE BY MOUTH AS DIRECTED PER PACKAGE INSTRUCTIONS    . Multiple Vitamin (MULTIVITAMIN WITH MINERALS) TABS tablet Take 1 tablet by mouth daily.    Marland Kitchen omeprazole (PRILOSEC) 20 MG capsule Take 20 mg by mouth daily.    . prednisoLONE acetate (PRED FORTE) 1 % ophthalmic suspension 1 drop 4 (four) times daily.    . SYMBICORT 160-4.5 MCG/ACT inhaler INHALE 2 PUFFS INTO THE LUNGS EVERY 12 HOURS AS NEEDED 30.6 Inhaler 1  . dronabinol (MARINOL) 2.5 MG capsule Take 1 capsule (2.5 mg total) by mouth 2 (two) times daily before lunch and supper. 180 capsule 0  . Glucosamine HCl 1000 MG TABS Take 1,000 mg by mouth daily.    Marland Kitchen levothyroxine (SYNTHROID) 50 MCG tablet Take 1 tablet (50 mcg total) by mouth daily before breakfast. 90 tablet 0   No facility-administered medications prior to visit.    ROS Review of Systems  Constitutional: Negative for chills, diaphoresis, fatigue and fever.  HENT: Negative.   Eyes: Negative.   Respiratory: Negative for cough, chest tightness, shortness of breath and wheezing.   Cardiovascular: Negative for chest pain, palpitations and leg swelling.  Gastrointestinal: Negative for abdominal pain, constipation, diarrhea and vomiting.  Endocrine: Negative.   Genitourinary: Negative.  Negative for difficulty urinating, dysuria and hematuria.  Musculoskeletal: Positive for arthralgias. Negative for myalgias.  Skin: Positive for color change. Negative for rash.  Neurological: Negative.  Negative for dizziness, weakness and light-headedness.  Hematological: Negative for adenopathy. Does not bruise/bleed easily.  Psychiatric/Behavioral: Negative.     Objective:  BP 140/80 (BP Location: Right Arm, Patient Position: Sitting, Cuff Size: Small)   Pulse 83    Temp 98.2 F (36.8 C) (Oral)   Ht 5\' 8"  (1.727 m)   Wt 161 lb (73 kg)   BMI 24.48 kg/m   BP Readings from Last 3 Encounters:  07/30/19 140/80  04/30/19 (!) 158/96  01/29/19 130/74    Wt Readings from Last 3 Encounters:  07/30/19 161 lb (73 kg)  06/13/19 155 lb (70.3 kg)  04/30/19 156 lb 4 oz (70.9 kg)    Physical Exam Vitals reviewed.  Constitutional:      Appearance: Normal appearance.  HENT:     Nose: Nose normal.     Mouth/Throat:     Mouth: Mucous membranes are moist.  Eyes:     General: No scleral icterus.    Conjunctiva/sclera: Conjunctivae normal.  Cardiovascular:     Rate and Rhythm: Normal rate and regular rhythm.     Pulses:          Dorsalis pedis pulses are 0 on the right side.       Posterior tibial pulses are 0 on the right side.  Pulmonary:     Effort: Pulmonary effort is normal.     Breath sounds: No stridor. No wheezing, rhonchi or rales.  Abdominal:     General: Abdomen is flat. Bowel sounds are normal. There is no distension.     Palpations: Abdomen is soft. There is no hepatomegaly, splenomegaly or mass.     Tenderness: There is no abdominal tenderness.  Musculoskeletal:        General: Swelling and tenderness present. Normal range of motion.     Cervical back: Neck supple.     Right lower leg: No edema.     Left lower leg: No edema.       Feet:  Feet:     Right foot:     Skin integrity: Erythema and warmth present. No ulcer, blister, skin breakdown, callus, dry skin or fissure.     Left foot:     Skin integrity: No ulcer, blister, skin breakdown, erythema, warmth, callus or dry skin.  Lymphadenopathy:     Cervical: No cervical adenopathy.  Skin:    General: Skin is warm and dry.  Neurological:     General: No focal deficit present.     Mental Status: He is alert.  Psychiatric:        Mood and Affect: Mood normal.        Behavior: Behavior normal.     Lab Results  Component Value Date   WBC 6.4 07/30/2019   HGB 13.0 07/30/2019     HCT 39.1 07/30/2019   PLT 200.0 07/30/2019   GLUCOSE 100 (H) 07/30/2019   CHOL 160 01/29/2019   TRIG 126.0 01/29/2019   HDL 56.00 01/29/2019   LDLCALC 78 01/29/2019   ALT 14 02/20/2018   AST 23 02/20/2018   NA 137 07/30/2019   K 3.8 07/30/2019   CL 102 07/30/2019   CREATININE 1.01 07/30/2019   BUN 14 07/30/2019   CO2 29 07/30/2019  TSH 33.09 (A) 07/18/2019   PSA 2.8 10/26/2018   INR 1.1 11/27/2018   HGBA1C 5.1 12/15/2015    DG Foot Complete Right  Result Date: 07/31/2019 CLINICAL DATA:  Chronic foot pain and swelling. EXAM: RIGHT FOOT COMPLETE - 3+ VIEW COMPARISON:  No prior. FINDINGS: Diffuse degenerative change. Degenerative changes most prominent about the second metatarsophalangeal joint. No acute bony or joint abnormality identified. No evidence of fracture or dislocation. IMPRESSION: Diffuse degenerative change. Degenerative changes most prominent about the second metatarsophalangeal joint. No acute abnormality. Electronically Signed   By: Marcello Moores  Register   On: 07/31/2019 08:33    Assessment & Plan:   Jenkins was seen today for hypertension and hypothyroidism.  Diagnoses and all orders for this visit:  Essential hypertension- His blood pressure is adequately well controlled.  Electrolytes and renal function are normal. 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  His dose has been increased by his endocrinologist. -     Cancel: TSH; Future  Chronic foot pain, right- Based on his labs and x-ray screening for osteomyelitis, gout, and cellulitis is negative.  His plain films do show degenerative changes so I recommended that he start taking an NSAID.  I am also concerned about PAD so I have asked him to undergo an ABI. -     CBC with Differential/Platelet; Future -     Sedimentation rate; Future -     Uric acid; Future -     DG Foot Complete Right; Future -     VAS [place] ABI WITH/WO TBI;  Future -     Uric acid -     Sedimentation rate -     CBC with Differential/Platelet  PAD (peripheral artery disease) (HCC) -     VAS [place] ABI WITH/WO TBI; Future  Primary localized osteoarthritis of right hip -     nabumetone (RELAFEN) 500 MG tablet; Take 1 tablet (500 mg total) by mouth 2 (two) times daily as needed.  Osteoarthritis of one hip, right -     nabumetone (RELAFEN) 500 MG tablet; Take 1 tablet (500 mg total) by mouth 2 (two) times daily as needed.  Primary osteoarthritis of right hip -     nabumetone (RELAFEN) 500 MG tablet; Take 1 tablet (500 mg total) by mouth 2 (two) times daily as needed.  Primary osteoarthritis of right foot -     nabumetone (RELAFEN) 500 MG tablet; Take 1 tablet (500 mg total) by mouth 2 (two) times daily as needed.   I have discontinued Harrison C. Todisco's Glucosamine HCl and dronabinol. I am also having him start on nabumetone. Additionally, I am having him maintain his vitamin C, Klor-Con M20, albuterol, cloNIDine, acidophilus, multivitamin with minerals, aspirin, Symbicort, cloNIDine, chlorthalidone, omeprazole, prednisoLONE acetate, Calcium Carbonate-Vitamin D3, levothyroxine, and methylPREDNISolone.  Meds ordered this encounter  Medications  . nabumetone (RELAFEN) 500 MG tablet    Sig: Take 1 tablet (500 mg total) by mouth 2 (two) times daily as needed.    Dispense:  180 tablet    Refill:  0   I spent 50 minutes in preparing to see the patient by review of recent labs, imaging and procedures, obtaining and reviewing separately obtained history, communicating with the patient and family or caregiver, ordering medications, tests or procedures, and documenting clinical information in the EHR including the differential Dx, treatment, and any further evaluation and other management of  1. Essential hypertension 2. Postablative hypothyroidism 3. Chronic foot pain, right 4. PAD (peripheral artery disease) ([place]) 5. Primary localized osteoarthritis of  right hip 6. Osteoarthritis of one hip, right 7. Primary osteoarthritis of right hip 8. Primary osteoarthritis of right foot  Follow-up: Return in about 2 weeks (around 08/13/2019).  Scarlette Calico, MD

## 2019-07-31 ENCOUNTER — Encounter: Payer: Self-pay | Admitting: Internal Medicine

## 2019-08-02 DIAGNOSIS — M19071 Primary osteoarthritis, right ankle and foot: Secondary | ICD-10-CM | POA: Insufficient documentation

## 2019-08-02 MED ORDER — NABUMETONE 500 MG PO TABS: 500.0000 mg | ORAL_TABLET | Freq: Two times a day (BID) | ORAL | 0 refills | Status: DC | PRN

## 2019-08-03 ENCOUNTER — Ambulatory Visit (HOSPITAL_COMMUNITY)
Admission: RE | Admit: 2019-08-03 | Discharge: 2019-08-03 | Disposition: A | Discharge status: Home / Self Care | Payer: Medicare Other | Source: Ambulatory Visit | Attending: Internal Medicine | Admitting: Internal Medicine

## 2019-08-03 ENCOUNTER — Other Ambulatory Visit: Payer: Self-pay

## 2019-08-03 DIAGNOSIS — G8929 Other chronic pain: Secondary | ICD-10-CM

## 2019-08-03 DIAGNOSIS — I739 Peripheral vascular disease, unspecified: Secondary | ICD-10-CM | POA: Insufficient documentation

## 2019-08-03 DIAGNOSIS — M79671 Pain in right foot: Secondary | ICD-10-CM

## 2019-08-23 ENCOUNTER — Encounter: Payer: Self-pay | Admitting: Radiation Oncology

## 2019-08-23 NOTE — Progress Notes (Signed)
I followed up with Dr. 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 We will see the patient back as needed.

## 2019-09-12 ENCOUNTER — Other Ambulatory Visit: Payer: Self-pay

## 2019-09-12 ENCOUNTER — Encounter: Payer: Self-pay | Admitting: Internal Medicine

## 2019-09-12 ENCOUNTER — Ambulatory Visit: Payer: Medicare Other | Admitting: Internal Medicine

## 2019-09-12 VITALS — BP 176/96 | HR 57 | Temp 98.5°F | Resp 16 | Ht 68.0 in | Wt 161.0 lb

## 2019-09-12 DIAGNOSIS — E890 Postprocedural hypothyroidism: Secondary | ICD-10-CM | POA: Diagnosis not present

## 2019-09-12 DIAGNOSIS — I10 Essential (primary) hypertension: Secondary | ICD-10-CM

## 2019-09-12 DIAGNOSIS — R001 Bradycardia, unspecified: Secondary | ICD-10-CM | POA: Insufficient documentation

## 2019-09-12 LAB — TSH: TSH: 14.78 u[IU]/mL — ABNORMAL HIGH (ref 0.35–4.50)

## 2019-09-12 MED ORDER — LEVOTHYROXINE SODIUM 112 MCG PO TABS: 112.0000 ug | ORAL_TABLET | Freq: Every day | ORAL | 0 refills | Status: DC

## 2019-09-12 MED ORDER — CLONIDINE 0.3 MG/24HR TD PTWK: 0.3000 mg | MEDICATED_PATCH | TRANSDERMAL | 1 refills | Status: DC

## 2019-09-12 MED ORDER — CHLORTHALIDONE 25 MG PO TABS: 25.0000 mg | ORAL_TABLET | Freq: Every day | ORAL | 0 refills | Status: DC

## 2019-09-12 NOTE — Patient Instructions (Signed)

## 2019-09-12 NOTE — Progress Notes (Signed)
Subjective:  Patient ID: Curtis Vance, male    DOB: 11/17/1943  Age: 74 y.o. MRN: [id number]  CC: Hypertension and Hypothyroidism  This visit occurred during the SARS-CoV-2 public health emergency.  Safety protocols were in place, including screening questions prior to the visit, additional usage of staff PPE, and extensive cleaning of exam room while observing appropriate contact time as indicated for disinfecting solutions.    HPI Curtis Vance presents for f/up- He was scheduled today to have an infusion of a monoclonal antibody to treat thyroid eye disease.  We were called because they felt like his blood pressure was too high and his pulse was too low for him to receive the infusion.  He says that he feels well.  He has noticed over the last few months that his heart rate can be low at time and he thinks his blood pressure drops with that.  He has therefore only been taking the chlorthalidone every 2 to 3 days.  According to prescription refills he should have run out of chlorthalidone about 6 weeks ago.  He denies headache, syncope, near syncope, dizziness, or lightheadedness.  Outpatient Medications Prior to Visit  Medication Sig Dispense Refill  . acidophilus (RISAQUAD) CAPS capsule Take 1 capsule by mouth daily.    Marland Kitchen albuterol (PROAIR HFA) 108 (90 Base) MCG/ACT inhaler Inhale 2 puffs into the lungs every 6 (six) hours as needed for wheezing or shortness of breath. 18 g 0  . Ascorbic Acid (VITAMIN C) 1000 MG tablet Take 1,000 mg by mouth daily.    Marland Kitchen aspirin 81 MG tablet Take 1 tablet (81 mg total) by mouth 2 (two) times daily at 10 AM and 5 PM. 60 tablet 0  . Calcium Carbonate-Vitamin D3 600-400 MG-UNIT TABS Take 1 tablet by mouth daily.    Marland Kitchen KLOR-CON M20 20 MEQ tablet TAKE 1 TABLET (20 MEQ TOTAL) BY MOUTH DAILY. (Patient taking differently: Take 20 mEq by mouth daily as needed (cramping). ) 90 tablet 1  . Multiple Vitamin (MULTIVITAMIN WITH MINERALS) TABS tablet Take 1 tablet by  mouth daily.    Marland Kitchen omeprazole (PRILOSEC) 20 MG capsule Take 20 mg by mouth daily.    . prednisoLONE acetate (PRED FORTE) 1 % ophthalmic suspension 1 drop 4 (four) times daily.    . SYMBICORT 160-4.5 MCG/ACT inhaler INHALE 2 PUFFS INTO THE LUNGS EVERY 12 HOURS AS NEEDED 30.6 Inhaler 1  . chlorthalidone (HYGROTON) 25 MG tablet Take 1 tablet (25 mg total) by mouth daily. 90 tablet 0  . cloNIDine (CATAPRES - DOSED IN MG/24 HR) 0.3 mg/24hr patch Place 1 patch (0.3 mg total) onto the skin once a week. 12 patch 1  . cloNIDine (CATAPRES) 0.1 MG tablet TAKE 1 TABLET (0.1 MG TOTAL) BY MOUTH 3 (THREE) TIMES DAILY. (Patient taking differently: Take 0.1 mg by mouth daily as needed (blood pressure of 180/100). ) 270 tablet 1  . levothyroxine (SYNTHROID) 75 MCG tablet Take 75 mcg by mouth every morning.    . methylPREDNISolone (MEDROL DOSEPAK) 4 MG TBPK tablet TAKE BY MOUTH AS DIRECTED PER PACKAGE INSTRUCTIONS    . nabumetone (RELAFEN) 500 MG tablet Take 1 tablet (500 mg total) by mouth 2 (two) times daily as needed. 180 tablet 0   No facility-administered medications prior to visit.    ROS Review of Systems  Constitutional: Negative.  Negative for appetite change, diaphoresis, fatigue and unexpected weight change.  HENT: Negative.   Eyes: Negative for visual disturbance.  Respiratory: Negative  for cough, chest tightness, shortness of breath and wheezing.   Cardiovascular: Negative for chest pain, palpitations and leg swelling.  Gastrointestinal: Negative for abdominal pain, constipation, diarrhea, nausea and vomiting.  Endocrine: Negative for cold intolerance and heat intolerance.  Genitourinary: Negative.  Negative for difficulty urinating.  Musculoskeletal: Negative.  Negative for back pain and myalgias.  Skin: Negative.  Negative for color change.  Neurological: Negative for dizziness, weakness, light-headedness and headaches.  Hematological: Negative for adenopathy. Does not bruise/bleed easily.    Psychiatric/Behavioral: Negative.     Objective:  BP (!) 176/96 (BP Location: Left Arm, Patient Position: Sitting, Cuff Size: Normal)   Pulse (!) 57   Temp 98.5 F (36.9 C) (Oral)   Resp 16   Ht 5\' 8"  (1.727 m)   Wt 161 lb (73 kg)   SpO2 95%   BMI 24.48 kg/m   BP Readings from Last 3 Encounters:  09/12/19 (!) 176/96  07/30/19 140/80  04/30/19 (!) 158/96    Wt Readings from Last 3 Encounters:  09/12/19 161 lb (73 kg)  07/30/19 161 lb (73 kg)  06/13/19 155 lb (70.3 kg)    Physical Exam Constitutional:      Appearance: Normal appearance.  HENT:     Nose: Nose normal.     Mouth/Throat:     Mouth: Mucous membranes are moist.     Pharynx: No oropharyngeal exudate.  Eyes:     General: No scleral icterus.    Conjunctiva/sclera: Conjunctivae normal.  Cardiovascular:     Rate and Rhythm: Regular rhythm. Bradycardia present.     Pulses: Normal pulses.     Heart sounds: No murmur. No gallop.      Comments: EKG - Sinus bradycardia, 55 bpm Otherwise normal EKG Pulmonary:     Effort: Pulmonary effort is normal.     Breath sounds: No stridor. No wheezing, rhonchi or rales.  Abdominal:     General: Abdomen is flat. Bowel sounds are normal. There is no distension.     Palpations: Abdomen is soft. There is no hepatomegaly, splenomegaly or mass.     Tenderness: There is no abdominal tenderness.  Musculoskeletal:     Cervical back: Neck supple.     Right lower leg: No edema.     Left lower leg: No edema.  Lymphadenopathy:     Cervical: No cervical adenopathy.  Neurological:     General: No focal deficit present.     Mental Status: He is alert.     Lab Results  Component Value Date   WBC 6.4 07/30/2019   HGB 13.0 07/30/2019   HCT 39.1 07/30/2019   PLT 200.0 07/30/2019   GLUCOSE 100 (H) 07/30/2019   CHOL 160 01/29/2019   TRIG 126.0 01/29/2019   HDL 56.00 01/29/2019   LDLCALC 78 01/29/2019   ALT 14 02/20/2018   AST 23 02/20/2018   NA 137 07/30/2019   K 3.8  07/30/2019   CL 102 07/30/2019   CREATININE 1.01 07/30/2019   BUN 14 07/30/2019   CO2 29 07/30/2019   TSH 14.78 (H) 09/12/2019   PSA 2.8 10/26/2018   INR 1.1 11/27/2018   HGBA1C 5.1 12/15/2015    VAS [place] ABI WITH/WO TBI  Result Date: 08/05/2019 LOWER EXTREMITY DOPPLER STUDY Indications: Foot pain.  Performing Technologist: Ralene Cork RVT  Examination Guidelines: A complete evaluation includes at minimum, Doppler waveform signals and systolic blood pressure reading at the level of bilateral brachial, anterior tibial, and posterior tibial arteries, when vessel segments are  accessible. Bilateral testing is considered an integral part of a complete examination. Photoelectric Plethysmograph (PPG) waveforms and toe systolic pressure readings are included as required and additional duplex testing as needed. Limited examinations for reoccurring indications may be performed as noted.  ABI Findings: +---------+------------------+-----+---------+--------+ Right    Rt Pressure (mmHg)IndexWaveform Comment  +---------+------------------+-----+---------+--------+ Brachial 118                                      +---------+------------------+-----+---------+--------+ PTA      126               1.03 triphasic         +---------+------------------+-----+---------+--------+ DP       141               1.16 triphasic         +---------+------------------+-----+---------+--------+ Great Toe105               0.86                   +---------+------------------+-----+---------+--------+ +---------+------------------+-----+---------+-------+ Left     Lt Pressure (mmHg)IndexWaveform Comment +---------+------------------+-----+---------+-------+ Brachial 122                                     +---------+------------------+-----+---------+-------+ PTA      121               0.99 triphasic        +---------+------------------+-----+---------+-------+ DP       127                1.04 triphasic        +---------+------------------+-----+---------+-------+ Great Toe100               0.82                  +---------+------------------+-----+---------+-------+ +-------+-----------+-----------+------------+------------+ ABI/TBIToday's ABIToday's TBIPrevious ABIPrevious TBI +-------+-----------+-----------+------------+------------+ Right  1.16       0.86                                +-------+-----------+-----------+------------+------------+ Left   1.04       0.82                                +-------+-----------+-----------+------------+------------+ No previous ABI.  Summary: Right: Resting right ankle-brachial index is within normal range. No evidence of significant right lower extremity arterial disease. The right toe-brachial index is normal. RT great toe pressure = 105 mmHg. Left: Resting left ankle-brachial index is within normal range. No evidence of significant left lower extremity arterial disease. The left toe-brachial index is normal. LT Great toe pressure = 100 mmHg.  *See table(s) above for measurements and observations.  Electronically signed by Servando Snare MD on 08/05/2019 at 1:46:50 PM.   Final     Assessment & Plan:   Demarus was seen today for hypertension and hypothyroidism.  Diagnoses and all orders for this visit:  Essential hypertension- His blood pressure is not adequately well controlled.  His heart rate is 55 so I do not feel comfortable increasing the dose of clonidine or adding a beta-blocker or CCB.  I have asked him to be more compliant with the chlorthalidone. -  chlorthalidone (HYGROTON) 25 MG tablet; Take 1 tablet (25 mg total) by mouth daily. -     cloNIDine (CATAPRES - DOSED IN MG/24 HR) 0.3 mg/24hr patch; Place 1 patch (0.3 mg total) onto the skin once a week.  Postablative hypothyroidism- His TSH remains mildly elevated.  I recommended that he increase his dose of levothyroxine. -     TSH; Future -     TSH -      levothyroxine (SYNTHROID) 112 MCG tablet; Take 1 tablet (112 mcg total) by mouth daily before breakfast.  Bradycardia- I have asked him to undergo an event monitor to see if he is having symptomatic bradycardia and to evaluate whether or not he needs to be evaluated for pacemaker. -     CARDIAC EVENT MONITOR; Future   I have discontinued Tara C. Goodine's levothyroxine, methylPREDNISolone, and nabumetone. I am also having him start on levothyroxine. Additionally, I am having him maintain his vitamin C, Klor-Con M20, albuterol, acidophilus, multivitamin with minerals, aspirin, Symbicort, omeprazole, prednisoLONE acetate, Calcium Carbonate-Vitamin D3, chlorthalidone, and cloNIDine.  Meds ordered this encounter  Medications  . chlorthalidone (HYGROTON) 25 MG tablet    Sig: Take 1 tablet (25 mg total) by mouth daily.    Dispense:  90 tablet    Refill:  0  . cloNIDine (CATAPRES - DOSED IN MG/24 HR) 0.3 mg/24hr patch    Sig: Place 1 patch (0.3 mg total) onto the skin once a week.    Dispense:  12 patch    Refill:  1  . levothyroxine (SYNTHROID) 112 MCG tablet    Sig: Take 1 tablet (112 mcg total) by mouth daily before breakfast.    Dispense:  90 tablet    Refill:  0     Follow-up: Return in about 6 weeks (around 10/24/2019).  Scarlette Calico, MD

## 2019-09-13 ENCOUNTER — Telehealth: Payer: Self-pay

## 2019-09-13 ENCOUNTER — Encounter: Payer: Self-pay | Admitting: Internal Medicine

## 2019-09-13 ENCOUNTER — Telehealth: Payer: Self-pay | Admitting: Radiology

## 2019-09-13 NOTE — Telephone Encounter (Signed)
Called pt spouse back and informed the that provider (eye doctor) that ordered the infusion will need to be contacted and informed about what happened at the 2nd infusion (attempt) and that we are recommending the heart monitor. I informed pt spouse that the eye doctor may want to discontinue the infusion but that would up to the eye doctor.

## 2019-09-13 NOTE — Telephone Encounter (Signed)
New message     The wife call needs the CMA to call her back to discuss which options on starting the heart monitor or go ahead and scheduled the iron infusion.

## 2019-09-13 NOTE — Telephone Encounter (Signed)
Enrolled patient for a 30 day [employer] event monitor to be mailed to patients home. Brief instructions were gone over with the patient and his wife. They know to expect the monitor to arrive in 5-7 days

## 2019-09-21 ENCOUNTER — Ambulatory Visit (INDEPENDENT_AMBULATORY_CARE_PROVIDER_SITE_OTHER): Payer: Medicare Other

## 2019-09-21 DIAGNOSIS — R001 Bradycardia, unspecified: Secondary | ICD-10-CM | POA: Diagnosis not present

## 2019-10-25 ENCOUNTER — Other Ambulatory Visit: Payer: Self-pay | Admitting: Internal Medicine

## 2019-10-25 DIAGNOSIS — R001 Bradycardia, unspecified: Secondary | ICD-10-CM

## 2019-10-26 ENCOUNTER — Telehealth: Payer: Self-pay

## 2019-10-26 NOTE — Telephone Encounter (Signed)
Called number listed for pt. Informed spouse per DPR about the cardiology referral and the reason why.   Informed spouse that she or the pt can call me back if they think of any questions and that cardiology will reach to them to schedule his appointment.

## 2019-10-26 NOTE — Telephone Encounter (Signed)
-----   Message from Janith Lima, MD sent at 10/25/2019 10:58 AM EDT ----- Pls let him know that his heart rate went down to 38 I have ordered a referral to cardiology to see if he needs a pacemaker  [place] ----- Message ----- From: Burnell Blanks, MD Sent: 10/24/2019   1:11 PM EDT To: Janith Lima, MD
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
## 2019-11-23 ENCOUNTER — Ambulatory Visit: Payer: Medicare Other | Admitting: Cardiovascular Disease

## 2019-11-23 ENCOUNTER — Other Ambulatory Visit: Payer: Self-pay

## 2019-11-23 ENCOUNTER — Encounter: Payer: Self-pay | Admitting: Cardiovascular Disease

## 2019-11-23 VITALS — BP 95/59 | HR 66 | Ht 68.0 in | Wt 152.2 lb

## 2019-11-23 DIAGNOSIS — I10 Essential (primary) hypertension: Secondary | ICD-10-CM

## 2019-11-23 DIAGNOSIS — R001 Bradycardia, unspecified: Secondary | ICD-10-CM

## 2019-11-23 NOTE — Patient Instructions (Signed)
Medication Instructions:  The current medical regimen is effective;  continue present plan and medications.  *If you need a refill on your cardiac medications before your next appointment, please call your pharmacy*    Follow-Up: At [facility], you and your health needs are our priority.  As part of our continuing mission to provide you with exceptional heart care, we have created designated Provider Care Teams.  These Care Teams include your primary Cardiologist (physician) and Advanced Practice Providers (APPs -  Physician Assistants and Nurse Practitioners) who all work together to provide you with the care you need, when you need it.  We recommend signing up for the patient portal called "MyChart".  Sign up information is provided on this After Visit Summary.  MyChart is used to connect with patients for Virtual Visits (Telemedicine).  Patients are able to view lab/test results, encounter notes, upcoming appointments, etc.  Non-urgent messages can be sent to your provider as well.   To learn more about what you can do with MyChart, go to [email or web address].    Your next appointment:   As needed  The format for your next appointment:   In Person  Provider:   Bridgeton O'Neal, MD      

## 2019-12-05 ENCOUNTER — Other Ambulatory Visit: Payer: Self-pay | Admitting: Internal Medicine

## 2020-01-14 ENCOUNTER — Other Ambulatory Visit: Payer: Self-pay | Admitting: Internal Medicine

## 2020-01-14 DIAGNOSIS — R64 Cachexia: Secondary | ICD-10-CM

## 2020-02-11 ENCOUNTER — Encounter: Payer: Self-pay | Admitting: Internal Medicine

## 2020-02-11 ENCOUNTER — Other Ambulatory Visit: Payer: Self-pay

## 2020-02-11 ENCOUNTER — Ambulatory Visit (INDEPENDENT_AMBULATORY_CARE_PROVIDER_SITE_OTHER)
Admission: RE | Admit: 2020-02-11 | Discharge: 2020-02-11 | Disposition: A | Discharge status: Home / Self Care | Payer: Medicare Other | Source: Ambulatory Visit | Attending: Internal Medicine | Admitting: Internal Medicine

## 2020-02-11 ENCOUNTER — Ambulatory Visit (INDEPENDENT_AMBULATORY_CARE_PROVIDER_SITE_OTHER): Payer: Medicare Other | Admitting: Internal Medicine

## 2020-02-11 VITALS — BP 142/78 | HR 77 | Temp 98.3°F | Resp 16 | Ht 68.0 in | Wt 150.0 lb

## 2020-02-11 DIAGNOSIS — E890 Postprocedural hypothyroidism: Secondary | ICD-10-CM | POA: Diagnosis not present

## 2020-02-11 DIAGNOSIS — E785 Hyperlipidemia, unspecified: Secondary | ICD-10-CM | POA: Diagnosis not present

## 2020-02-11 DIAGNOSIS — R059 Cough, unspecified: Secondary | ICD-10-CM | POA: Diagnosis not present

## 2020-02-11 DIAGNOSIS — Z23 Encounter for immunization: Secondary | ICD-10-CM

## 2020-02-11 DIAGNOSIS — Z0000 Encounter for general adult medical examination without abnormal findings: Secondary | ICD-10-CM

## 2020-02-11 DIAGNOSIS — B3789 Other sites of candidiasis: Secondary | ICD-10-CM

## 2020-02-11 DIAGNOSIS — J22 Unspecified acute lower respiratory infection: Secondary | ICD-10-CM

## 2020-02-11 DIAGNOSIS — I10 Essential (primary) hypertension: Secondary | ICD-10-CM | POA: Diagnosis not present

## 2020-02-11 MED ORDER — FLUCONAZOLE 100 MG PO TABS: 100.0000 mg | ORAL_TABLET | Freq: Every day | ORAL | 1 refills | Status: AC

## 2020-02-11 NOTE — Patient Instructions (Signed)
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
## 2020-02-12 LAB — BASIC METABOLIC PANEL
BUN: 18 mg/dL (ref 6–23)
CO2: 31 mEq/L (ref 19–32)
Calcium: 9.8 mg/dL (ref 8.4–10.5)
Chloride: 97 mEq/L (ref 96–112)
Creatinine, Ser: 1.05 mg/dL (ref 0.40–1.50)
GFR: 67.55 mL/min (ref >60.00)
Glucose, Bld: 85 mg/dL (ref 70–99)
Potassium: 3.5 mEq/L (ref 3.5–5.1)
Sodium: 137 mEq/L (ref 135–145)

## 2020-02-12 LAB — CBC WITH DIFFERENTIAL/PLATELET
Basophils Absolute: 0.1 10*3/uL (ref 0.0–0.1)
Basophils Relative: 1.3 % (ref 0.0–3.0)
Eosinophils Absolute: 0.1 10*3/uL (ref 0.0–0.7)
Eosinophils Relative: 1.4 % (ref 0.0–5.0)
HCT: 38 % — ABNORMAL LOW (ref 39.0–52.0)
Hemoglobin: 12.8 g/dL — ABNORMAL LOW (ref 13.0–17.0)
Lymphocytes Relative: 27.3 % (ref 12.0–46.0)
Lymphs Abs: 1.9 10*3/uL (ref 0.7–4.0)
MCHC: 33.6 g/dL (ref 30.0–36.0)
MCV: 92.3 fl (ref 78.0–100.0)
Monocytes Absolute: 0.6 10*3/uL (ref 0.1–1.0)
Monocytes Relative: 9.3 % (ref 3.0–12.0)
Neutro Abs: 4.2 10*3/uL (ref 1.4–7.7)
Neutrophils Relative %: 60.7 % (ref 43.0–77.0)
Platelets: 180 10*3/uL (ref 150.0–400.0)
RBC: 4.12 Mil/uL — ABNORMAL LOW (ref 4.22–5.81)
RDW: 12.9 % (ref 11.5–15.5)
WBC: 6.9 10*3/uL (ref 4.0–10.5)

## 2020-02-12 LAB — LIPID PANEL
Cholesterol: 180 mg/dL (ref 0–200)
HDL: 47.6 mg/dL (ref >39.00)
LDL Cholesterol: 113 mg/dL — ABNORMAL HIGH (ref 0–99)
NonHDL: 132.71
Total CHOL/HDL Ratio: 4
Triglycerides: 99 mg/dL (ref 0.0–149.0)
VLDL: 19.8 mg/dL (ref 0.0–40.0)

## 2020-02-12 LAB — TSH: TSH: 0.11 u[IU]/mL — ABNORMAL LOW (ref 0.35–4.50)

## 2020-02-12 MED ORDER — LEVOTHYROXINE SODIUM 75 MCG PO TABS: 75.0000 ug | ORAL_TABLET | Freq: Every day | ORAL | 0 refills | Status: DC

## 2020-02-13 DIAGNOSIS — Z23 Encounter for immunization: Secondary | ICD-10-CM | POA: Insufficient documentation

## 2020-02-13 DIAGNOSIS — J22 Unspecified acute lower respiratory infection: Secondary | ICD-10-CM | POA: Insufficient documentation

## 2020-02-13 MED ORDER — CEFDINIR 300 MG PO CAPS: 300.0000 mg | ORAL_CAPSULE | Freq: Two times a day (BID) | ORAL | 0 refills | Status: AC

## 2020-02-13 MED ORDER — LIVALO 1 MG PO TABS: 1.0000 | ORAL_TABLET | Freq: Every day | ORAL | 1 refills | Status: DC

## 2020-02-22 ENCOUNTER — Other Ambulatory Visit: Payer: Self-pay | Admitting: Internal Medicine

## 2020-02-22 ENCOUNTER — Telehealth: Payer: Self-pay | Admitting: Internal Medicine

## 2020-02-22 DIAGNOSIS — B356 Tinea cruris: Secondary | ICD-10-CM | POA: Insufficient documentation

## 2020-02-22 MED ORDER — TERBINAFINE HCL 250 MG PO TABS: 250.0000 mg | ORAL_TABLET | Freq: Every day | ORAL | 0 refills | Status: DC

## 2020-02-22 NOTE — Telephone Encounter (Signed)
    Spouse calling to report fluconazole has not helped the patient's groin rash since 10/18 office visit Requesting alternative medication

## 2020-03-07 ENCOUNTER — Other Ambulatory Visit: Payer: Self-pay | Admitting: Internal Medicine

## 2020-03-07 DIAGNOSIS — B356 Tinea cruris: Secondary | ICD-10-CM

## 2020-03-07 MED ORDER — TERBINAFINE HCL 250 MG PO TABS: 250.0000 mg | ORAL_TABLET | Freq: Every day | ORAL | 0 refills | Status: AC

## 2020-03-07 NOTE — Telephone Encounter (Signed)
   Spouse calling to groin area rash is a lot better but still an issue. Patient states when the area becomes moist, the rash flares up Requesting additional Terbinafine

## 2020-03-12 ENCOUNTER — Ambulatory Visit (INDEPENDENT_AMBULATORY_CARE_PROVIDER_SITE_OTHER): Payer: Medicare Other

## 2020-03-12 ENCOUNTER — Ambulatory Visit (INDEPENDENT_AMBULATORY_CARE_PROVIDER_SITE_OTHER): Payer: Medicare Other | Admitting: Family Medicine

## 2020-03-12 ENCOUNTER — Encounter: Payer: Self-pay | Admitting: Family Medicine

## 2020-03-12 ENCOUNTER — Other Ambulatory Visit: Payer: Self-pay

## 2020-03-12 VITALS — BP 150/90 | HR 81 | Ht 68.0 in | Wt 158.0 lb

## 2020-03-12 DIAGNOSIS — M1612 Unilateral primary osteoarthritis, left hip: Secondary | ICD-10-CM

## 2020-03-12 DIAGNOSIS — M25552 Pain in left hip: Secondary | ICD-10-CM

## 2020-03-12 NOTE — Assessment & Plan Note (Signed)
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  It seems to have decreased range of motion.  Patient did have knee replacement on the right side but did have some difficulty and would like to discuss with another surgeon.  Will refer today.  X-rays are pending.  Based on the decreased range of motion though I am highly suspicious for severe arthritic changes.  We discussed different range of motion exercises.  We discussed that patient is the primary caregiver for his wife who is now having renal failure.  Patient feels like if he could have the surgery sooner he would be able to take care of her better at this time.  Patient will follow up with me again only as needed but I am here for questions.

## 2020-03-12 NOTE — Patient Instructions (Addendum)
Good to see you  Get x-ray on your way out of left hip refer to ortho care Dr. Ninfa Linden they will call to schedule  Ice 20 minutes a couple times a day Arnica lotion OTC  Follow up as needed
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
## 2020-03-17 ENCOUNTER — Telehealth: Payer: Self-pay | Admitting: Internal Medicine

## 2020-03-17 NOTE — Telephone Encounter (Signed)
Pitavastatin Calcium (LIVALO) 1 MG TABS Patient calling unsure why this medication is on his medication list because he doesn't know what it is and never received anything from [facility], as well he does not use [facility]. Would like to speak to someone about this.

## 2020-03-18 ENCOUNTER — Other Ambulatory Visit: Payer: Self-pay | Admitting: Internal Medicine

## 2020-03-18 DIAGNOSIS — E785 Hyperlipidemia, unspecified: Secondary | ICD-10-CM

## 2020-03-18 MED ORDER — LIVALO 1 MG PO TABS: 1.0000 | ORAL_TABLET | Freq: Every day | ORAL | 1 refills | Status: DC

## 2020-03-19 NOTE — Telephone Encounter (Signed)
Called to advise patient on the medication he had concerns on however phone constantly rang, unable to leave message.

## 2020-03-24 ENCOUNTER — Other Ambulatory Visit: Payer: Self-pay | Admitting: Internal Medicine

## 2020-03-24 ENCOUNTER — Telehealth: Payer: Self-pay | Admitting: Internal Medicine

## 2020-03-24 DIAGNOSIS — L280 Lichen simplex chronicus: Secondary | ICD-10-CM

## 2020-03-24 MED ORDER — FLUOCINONIDE EMULSIFIED BASE 0.05 % EX CREA: 1.0000 "application " | TOPICAL_CREAM | Freq: Two times a day (BID) | CUTANEOUS | 1 refills | Status: DC

## 2020-03-24 NOTE — Telephone Encounter (Signed)
Call received from [facility] reference her husband  He is still having increased sweating in his private area and some itching, even after two rounds of terbinafine (LAMISIL) 250 MG tablet   Does the patient need to see Urologist? Does the patient need a different medication?  Also tried  fluconazole (DIFLUCAN) 100 MG tablet 10 tablet 1   Prior to the terbinafine  Please contact the patient's wife at [phone]

## 2020-03-24 NOTE — Telephone Encounter (Signed)
Curtis Vance, wife, has been informed and stated that the pt has already picked up the Rx.

## 2020-03-26 ENCOUNTER — Other Ambulatory Visit: Payer: Self-pay

## 2020-03-26 ENCOUNTER — Encounter: Payer: Self-pay | Admitting: Physician Assistant

## 2020-03-26 ENCOUNTER — Ambulatory Visit (INDEPENDENT_AMBULATORY_CARE_PROVIDER_SITE_OTHER): Payer: Medicare Other | Admitting: Physician Assistant

## 2020-03-26 ENCOUNTER — Ambulatory Visit: Payer: Self-pay

## 2020-03-26 DIAGNOSIS — M1612 Unilateral primary osteoarthritis, left hip: Secondary | ICD-10-CM | POA: Diagnosis not present

## 2020-03-26 NOTE — Progress Notes (Signed)
Subjective: Patient is here for ultrasound-guided intra-articular left hip injection.   Pain from DJD.  Objective: Pain with passive internal rotation.  Limited range of motion.  Procedure: Ultrasound guided injection is preferred based studies that show increased duration, increased effect, greater accuracy, decreased procedural pain, increased response rate, and decreased cost with ultrasound guided versus blind injection.   Verbal informed consent obtained.  Time-out conducted.  Noted no overlying erythema, induration, or other signs of local infection. Ultrasound-guided left hip injection: After sterile prep with Betadine, injected 4 cc 1% lidocaine without epinephrine and 40 mg methylprednisolone using a 22-gauge spinal needle, passing the needle through the iliofemoral ligament into the femoral head/neck junction.  Injectate seen filling the joint capsule.  Good immediate relief.
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
## 2020-04-02 LAB — TSH: TSH: 0.4 — AB (ref 0.41–5.90)
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
## 2020-04-23 ENCOUNTER — Ambulatory Visit: Payer: Medicare Other | Admitting: Orthopaedic Surgery

## 2020-05-07 ENCOUNTER — Encounter: Payer: Self-pay | Admitting: Orthopaedic Surgery

## 2020-05-07 ENCOUNTER — Ambulatory Visit (INDEPENDENT_AMBULATORY_CARE_PROVIDER_SITE_OTHER): Payer: Medicare Other | Admitting: Orthopaedic Surgery

## 2020-05-07 ENCOUNTER — Other Ambulatory Visit: Payer: Self-pay

## 2020-05-07 DIAGNOSIS — M1612 Unilateral primary osteoarthritis, left hip: Secondary | ICD-10-CM | POA: Diagnosis not present

## 2020-05-07 NOTE — Progress Notes (Signed)
The patient comes in for continued follow-up as it relates to left hip pain. He did have a steroid injection under direct ultrasound by Dr. Junius Roads in the left hip joint. He says that helps some but most of his pain is now just on the lateral aspect of the hip. He has x-rays of the left hip showing significant arthritis. He has a history of a right hip replacement. He also has extensive spine surgery involving mainly his cervical spine. He is 75 years old. He is wanting to avoid any type of surgical intervention. He has had some chronic bilateral shoulder pain after a mechanical fall back in 2020. He is requesting a trigger point injection in the trapezius area on the right side.  I can put his left hip through internal and external rotation surprising and moves well with no pain in the groin today in spite of the arthritis findings on x-ray. His pain seems to be over the IT band and the proximal trochanteric area on the left side.  There is some fullness in the trapezius area on the right side. Some of this is posture related. Certainly it could be a lipoma as well. I did provide a trigger point injection in the point of maximal tenderness in this area.  He would like to try outpatient physical therapy and I agree with this as well. We will see if we can set this up as an outpatient to work on any modalities that can help decrease the lateral aspect of pain along his left hip. I will then see him back in about 6 weeks to see how he is doing overall. All questions and concerns were answered and addressed.

## 2020-05-19 ENCOUNTER — Encounter: Payer: Self-pay | Admitting: Internal Medicine

## 2020-05-19 ENCOUNTER — Other Ambulatory Visit: Payer: Self-pay

## 2020-05-19 ENCOUNTER — Ambulatory Visit: Payer: Medicare Other | Admitting: Internal Medicine

## 2020-05-19 VITALS — BP 126/84 | HR 72 | Temp 98.1°F | Ht 68.0 in | Wt 156.0 lb

## 2020-05-19 DIAGNOSIS — I10 Essential (primary) hypertension: Secondary | ICD-10-CM

## 2020-05-19 DIAGNOSIS — E890 Postprocedural hypothyroidism: Secondary | ICD-10-CM

## 2020-05-19 DIAGNOSIS — E785 Hyperlipidemia, unspecified: Secondary | ICD-10-CM | POA: Diagnosis not present

## 2020-05-19 DIAGNOSIS — R252 Cramp and spasm: Secondary | ICD-10-CM | POA: Diagnosis not present

## 2020-05-19 LAB — MAGNESIUM: Magnesium: 1.8 mg/dL (ref 1.5–2.5)

## 2020-05-19 LAB — BASIC METABOLIC PANEL
BUN: 16 mg/dL (ref 6–23)
CO2: 30 mEq/L (ref 19–32)
Calcium: 9.9 mg/dL (ref 8.4–10.5)
Chloride: 100 mEq/L (ref 96–112)
Creatinine, Ser: 1.03 mg/dL (ref 0.40–1.50)
GFR: 69.58 mL/min (ref >60.00)
Glucose, Bld: 105 mg/dL — ABNORMAL HIGH (ref 70–99)
Potassium: 3.6 mEq/L (ref 3.5–5.1)
Sodium: 138 mEq/L (ref 135–145)

## 2020-05-19 LAB — C-REACTIVE PROTEIN: CRP: <1 mg/dL (ref 0.5–20.0)

## 2020-05-19 LAB — CK: Total CK: 276 U/L — ABNORMAL HIGH (ref 7–232)

## 2020-05-19 NOTE — Progress Notes (Signed)
Subjective:  Patient ID: Curtis Vance, male    DOB: 11/17/1943  Age: 75 y.o. MRN: [id number]  CC: Hypertension and Hypothyroidism  This visit occurred during the SARS-CoV-2 public health emergency.  Safety protocols were in place, including screening questions prior to the visit, additional usage of staff PPE, and extensive cleaning of exam room while observing appropriate contact time as indicated for disinfecting solutions.    HPI Curtis Vance presents for f/up - He continues to complain of muscle cramps. He has also had mild intermittent constipation and weight loss.  Outpatient Medications Prior to Visit  Medication Sig Dispense Refill  . acidophilus (RISAQUAD) CAPS capsule Take 1 capsule by mouth daily.    . Ascorbic Acid (VITAMIN C) 1000 MG tablet Take 1,000 mg by mouth daily.    Marland Kitchen aspirin 81 MG tablet Take 1 tablet (81 mg total) by mouth 2 (two) times daily at 10 AM and 5 PM. 60 tablet 0  . budesonide-formoterol (SYMBICORT) 160-4.5 MCG/ACT inhaler INHALE 2 PUFFS INTO THE LUNGS EVERY 12 HOURS AS NEEDED 30.6 g 1  . Calcium Carbonate-Vitamin D3 600-400 MG-UNIT TABS Take 1 tablet by mouth daily.    . chlorthalidone (HYGROTON) 25 MG tablet Take 1 tablet (25 mg total) by mouth daily. 90 tablet 0  . cloNIDine (CATAPRES - DOSED IN MG/24 HR) 0.3 mg/24hr patch Place 1 patch (0.3 mg total) onto the skin once a week. 12 patch 1  . fluocinonide-emollient (LIDEX-E) 0.05 % cream Apply 1 application topically 2 (two) times daily. 60 g 1  . KLOR-CON M20 20 MEQ tablet TAKE 1 TABLET (20 MEQ TOTAL) BY MOUTH DAILY. (Patient taking differently: Take 20 mEq by mouth daily as needed (cramping).) 90 tablet 1  . levothyroxine (SYNTHROID) 75 MCG tablet Take 1 tablet (75 mcg total) by mouth daily. 90 tablet 0  . Multiple Vitamin (MULTIVITAMIN WITH MINERALS) TABS tablet Take 1 tablet by mouth daily.    Marland Kitchen omeprazole (PRILOSEC) 20 MG capsule Take 20 mg by mouth daily.    . Pitavastatin Calcium (LIVALO) 1  MG TABS Take 1 tablet (1 mg total) by mouth daily. 90 tablet 1   No facility-administered medications prior to visit.    ROS Review of Systems  Constitutional: Negative.  Negative for diaphoresis and fatigue.  HENT: Negative.   Eyes: Negative.   Respiratory: Negative for cough, chest tightness, shortness of breath and wheezing.   Cardiovascular: Negative for chest pain, palpitations and leg swelling.  Gastrointestinal: Positive for constipation. Negative for abdominal pain, diarrhea, nausea and vomiting.  Endocrine: Negative.   Genitourinary: Negative.  Negative for difficulty urinating.  Musculoskeletal: Positive for myalgias. Negative for arthralgias and back pain.  Skin: Negative.   Neurological: Negative.  Negative for dizziness, weakness, light-headedness and numbness.  Hematological: Negative for adenopathy. Does not bruise/bleed easily.  Psychiatric/Behavioral: Negative.     Objective:  BP 126/84   Pulse 72   Temp 98.1 F (36.7 C) (Oral)   Ht 5\' 8"  (1.727 m)   Wt 156 lb (70.8 kg)   SpO2 96%   BMI 23.72 kg/m   BP Readings from Last 3 Encounters:  05/19/20 126/84  03/12/20 (!) 150/90  02/11/20 (!) 142/78    Wt Readings from Last 3 Encounters:  05/19/20 156 lb (70.8 kg)  03/12/20 158 lb (71.7 kg)  02/11/20 150 lb (68 kg)    Physical Exam Vitals reviewed.  Constitutional:      Appearance: Normal appearance.  HENT:  Nose: Nose normal.     Mouth/Throat:     Mouth: Mucous membranes are moist.  Eyes:     General: No scleral icterus.    Conjunctiva/sclera: Conjunctivae normal.  Cardiovascular:     Rate and Rhythm: Normal rate and regular rhythm.     Heart sounds: No murmur heard.   Pulmonary:     Effort: Pulmonary effort is normal.     Breath sounds: No stridor. No wheezing, rhonchi or rales.  Abdominal:     General: Abdomen is flat. Bowel sounds are normal. There is no distension.     Palpations: Abdomen is soft. There is no hepatomegaly,  splenomegaly or mass.     Tenderness: There is no abdominal tenderness.  Musculoskeletal:        General: Normal range of motion.     Cervical back: Neck supple.     Right lower leg: No edema.     Left lower leg: No edema.  Lymphadenopathy:     Cervical: No cervical adenopathy.  Skin:    General: Skin is warm and dry.     Coloration: Skin is not pale.  Neurological:     General: No focal deficit present.     Mental Status: He is alert.  Psychiatric:        Mood and Affect: Mood normal.        Behavior: Behavior normal.     Lab Results  Component Value Date   WBC 6.9 02/11/2020   HGB 12.8 (L) 02/11/2020   HCT 38.0 (L) 02/11/2020   PLT 180.0 02/11/2020   GLUCOSE 105 (H) 05/19/2020   CHOL 180 02/11/2020   TRIG 99.0 02/11/2020   HDL 47.60 02/11/2020   LDLCALC 113 (H) 02/11/2020   ALT 14 02/20/2018   AST 23 02/20/2018   NA 138 05/19/2020   K 3.6 05/19/2020   CL 100 05/19/2020   CREATININE 1.03 05/19/2020   BUN 16 05/19/2020   CO2 30 05/19/2020   TSH 0.40 (A) 04/02/2020   PSA 2.8 10/26/2018   INR 1.1 11/27/2018   HGBA1C 5.1 12/15/2015    DG Chest 2 View  Result Date: 02/12/2020 CLINICAL DATA:  Productive cough. EXAM: CHEST - 2 VIEW COMPARISON:  11/27/2018. FINDINGS: Mediastinum hilar structures normal. Lungs are clear. No pleural effusion or pneumothorax. Heart size normal. Degenerative change and scoliosis thoracic spine. Prior cervical spine fusion. Two metallic fragments again noted over the left shoulder. IMPRESSION: No acute cardiopulmonary disease. Electronically Signed   By: Curtis Vance  Register   On: 02/12/2020 09:51    Assessment & Plan:   Curtis Vance was seen today for hypertension and hypothyroidism.  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  Hyperlipidemia with target LDL less than 130- He has achieved his LDL goal. -     CK; Future -     CK  Muscle cramps- He has a very mild elevation in his CPK  but nothing that needs to be treated. I think this is related to the iatrogenic hyperthyroidism. He will discuss his thyroid supplement with his endocrinologist. -     Magnesium; Future -     CK; Future -     C-reactive protein; Future -     C-reactive protein -     CK -     Magnesium  Essential hypertension- His blood pressure is adequately well controlled. -     Basic metabolic panel; Future -     Basic metabolic  panel   I am having Virl C. Flagg maintain his vitamin C, [place]-Con M20, acidophilus, multivitamin with minerals, aspirin, omeprazole, Calcium Carbonate-Vitamin D3, chlorthalidone, cloNIDine, budesonide-formoterol, levothyroxine, Livalo, and fluocinonide-emollient.  No orders of the defined types were placed in this encounter.    Follow-up: No follow-ups on file.  Scarlette Calico, MD

## 2020-05-23 ENCOUNTER — Telehealth: Payer: Self-pay | Admitting: Internal Medicine

## 2020-05-23 ENCOUNTER — Encounter: Payer: Self-pay | Admitting: Internal Medicine

## 2020-05-23 NOTE — Patient Instructions (Signed)

## 2020-05-23 NOTE — Telephone Encounter (Signed)
After reviewing the last few OV notes it looks like the pt follow up every 3 months.

## 2020-05-23 NOTE — Telephone Encounter (Signed)
Patient wondering when he needs to schedule a follow up appointment

## 2020-05-27 NOTE — Telephone Encounter (Signed)
Attempted to reach pt, no VM

## 2020-05-30 ENCOUNTER — Ambulatory Visit: Payer: Medicare Other | Attending: Orthopaedic Surgery | Admitting: Physical Therapy

## 2020-05-30 ENCOUNTER — Encounter: Payer: Self-pay | Admitting: Physical Therapy

## 2020-05-30 ENCOUNTER — Other Ambulatory Visit: Payer: Self-pay

## 2020-05-30 DIAGNOSIS — M6281 Muscle weakness (generalized): Secondary | ICD-10-CM | POA: Diagnosis present

## 2020-05-30 DIAGNOSIS — R2689 Other abnormalities of gait and mobility: Secondary | ICD-10-CM | POA: Insufficient documentation

## 2020-05-30 DIAGNOSIS — M25552 Pain in left hip: Secondary | ICD-10-CM | POA: Diagnosis present
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
## 2020-06-05 ENCOUNTER — Encounter: Payer: Self-pay | Admitting: Physical Therapy

## 2020-06-05 ENCOUNTER — Ambulatory Visit: Payer: Medicare Other | Admitting: Physical Therapy

## 2020-06-05 ENCOUNTER — Other Ambulatory Visit: Payer: Self-pay

## 2020-06-05 DIAGNOSIS — M25552 Pain in left hip: Secondary | ICD-10-CM

## 2020-06-05 DIAGNOSIS — R2689 Other abnormalities of gait and mobility: Secondary | ICD-10-CM

## 2020-06-05 DIAGNOSIS — M6281 Muscle weakness (generalized): Secondary | ICD-10-CM
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
## 2020-06-18 ENCOUNTER — Ambulatory Visit: Payer: Self-pay

## 2020-06-18 ENCOUNTER — Ambulatory Visit (INDEPENDENT_AMBULATORY_CARE_PROVIDER_SITE_OTHER): Payer: Medicare Other | Admitting: Orthopaedic Surgery

## 2020-06-18 ENCOUNTER — Encounter: Payer: Self-pay | Admitting: Orthopaedic Surgery

## 2020-06-18 DIAGNOSIS — M25512 Pain in left shoulder: Secondary | ICD-10-CM

## 2020-06-18 DIAGNOSIS — G8929 Other chronic pain: Secondary | ICD-10-CM
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
## 2020-06-19 ENCOUNTER — Encounter: Payer: Self-pay | Admitting: Physical Therapy

## 2020-06-19 ENCOUNTER — Other Ambulatory Visit: Payer: Self-pay

## 2020-06-19 ENCOUNTER — Ambulatory Visit: Payer: Medicare Other | Admitting: Physical Therapy

## 2020-06-19 DIAGNOSIS — M25552 Pain in left hip: Secondary | ICD-10-CM | POA: Diagnosis not present

## 2020-06-19 DIAGNOSIS — M6281 Muscle weakness (generalized): Secondary | ICD-10-CM

## 2020-06-19 DIAGNOSIS — R2689 Other abnormalities of gait and mobility: Secondary | ICD-10-CM
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
## 2020-06-24 ENCOUNTER — Encounter: Payer: Self-pay | Admitting: Physical Therapy

## 2020-06-24 ENCOUNTER — Other Ambulatory Visit: Payer: Self-pay

## 2020-06-24 ENCOUNTER — Ambulatory Visit: Payer: Medicare Other | Attending: Orthopaedic Surgery | Admitting: Physical Therapy

## 2020-06-24 DIAGNOSIS — R2689 Other abnormalities of gait and mobility: Secondary | ICD-10-CM

## 2020-06-24 DIAGNOSIS — M25552 Pain in left hip: Secondary | ICD-10-CM | POA: Insufficient documentation

## 2020-06-24 DIAGNOSIS — M6281 Muscle weakness (generalized): Secondary | ICD-10-CM | POA: Diagnosis present

## 2020-06-24 NOTE — Patient Instructions (Signed)
Access Code: [email or web address] URL: [email or web address] Date: 06/24/2020 Prepared by: Hilda Blades  Exercises Side Lunge Adductor Stretch - 1 x daily - 7 x weekly - 30 seconds hold - 3 reps Hook Lying Single Knee to Chest Stretch with Towel - 1 x daily - 7 x weekly - 3 reps - 30 seconds hold Supine Hip Adductor Stretch - 1 x daily - 7 x weekly - 2 sets - 10 reps Supine Bridge - 1 x daily - 7 x weekly - 2 sets - 10-20 reps Supine Active Straight Leg Raise - 1 x daily - 7 x weekly - 2 sets - 10-20 reps Hooklying Clamshell with Resistance - 1 x daily - 7 x weekly - 2 sets - 15 reps Supine March with Resistance Band - 1 x daily - 7 x weekly - 2 sets - 20 reps Sit to Stand - 1 x daily - 7 x weekly - 2 sets - 10 reps
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
## 2020-06-25 ENCOUNTER — Ambulatory Visit: Payer: Medicare Other | Admitting: Family Medicine

## 2020-06-25 ENCOUNTER — Encounter: Payer: Self-pay | Admitting: Family Medicine

## 2020-06-25 ENCOUNTER — Ambulatory Visit: Payer: Self-pay

## 2020-06-25 DIAGNOSIS — M19012 Primary osteoarthritis, left shoulder: Secondary | ICD-10-CM | POA: Diagnosis not present

## 2020-06-25 DIAGNOSIS — M25512 Pain in left shoulder: Secondary | ICD-10-CM

## 2020-06-25 DIAGNOSIS — G8929 Other chronic pain: Secondary | ICD-10-CM | POA: Diagnosis not present

## 2020-06-25 NOTE — Progress Notes (Signed)
Subjective: Patient is here for ultrasound-guided intra-articular left glenohumeral injection.  He has been having quite a bit of pain and popping with active range of motion.  Objective: He has tenderness at the [facility] joint, but also crepitus deep in the shoulder with passive internal and external rotation.  Procedure: Ultrasound guided injection is preferred based studies that show increased duration, increased effect, greater accuracy, decreased procedural pain, increased response rate, and decreased cost with ultrasound guided versus blind injection.   Verbal informed consent obtained.  Time-out conducted.  Noted no overlying erythema, induration, or other signs of local infection. Ultrasound-guided left glenohumeral injection: After sterile prep with Betadine, injected 4 cc 0.25% bupivocaine without epinephrine and 6 mg betamethasone using a 22-gauge spinal needle, passing the needle from posterior approach into the glenohumeral joint.  Injectate was seen filling the joint capsule.  He had very good immediate relief.  Elected to not inject the [facility] joint based on his response to the glenohumeral injection.

## 2020-06-26 ENCOUNTER — Other Ambulatory Visit: Payer: Self-pay

## 2020-06-26 ENCOUNTER — Ambulatory Visit: Payer: Medicare Other | Admitting: Physical Therapy

## 2020-06-26 ENCOUNTER — Encounter: Payer: Self-pay | Admitting: Physical Therapy

## 2020-06-26 DIAGNOSIS — M25552 Pain in left hip: Secondary | ICD-10-CM

## 2020-06-26 DIAGNOSIS — R2689 Other abnormalities of gait and mobility: Secondary | ICD-10-CM

## 2020-06-26 DIAGNOSIS — M6281 Muscle weakness (generalized): Secondary | ICD-10-CM
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
## 2020-07-01 ENCOUNTER — Other Ambulatory Visit: Payer: Self-pay

## 2020-07-01 ENCOUNTER — Encounter: Payer: Self-pay | Admitting: Physical Therapy

## 2020-07-01 ENCOUNTER — Ambulatory Visit: Payer: Medicare Other | Admitting: Physical Therapy

## 2020-07-01 DIAGNOSIS — M25552 Pain in left hip: Secondary | ICD-10-CM

## 2020-07-01 DIAGNOSIS — R2689 Other abnormalities of gait and mobility: Secondary | ICD-10-CM

## 2020-07-01 DIAGNOSIS — M6281 Muscle weakness (generalized): Secondary | ICD-10-CM
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
## 2020-07-03 ENCOUNTER — Encounter: Payer: Self-pay | Admitting: Physical Therapy

## 2020-07-03 ENCOUNTER — Other Ambulatory Visit: Payer: Self-pay

## 2020-07-03 ENCOUNTER — Ambulatory Visit: Payer: Medicare Other | Admitting: Physical Therapy

## 2020-07-03 DIAGNOSIS — M25552 Pain in left hip: Secondary | ICD-10-CM

## 2020-07-03 DIAGNOSIS — M6281 Muscle weakness (generalized): Secondary | ICD-10-CM

## 2020-07-03 DIAGNOSIS — R2689 Other abnormalities of gait and mobility: Secondary | ICD-10-CM
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
## 2020-07-08 ENCOUNTER — Other Ambulatory Visit: Payer: Self-pay

## 2020-07-08 ENCOUNTER — Ambulatory Visit: Payer: Medicare Other | Admitting: Physical Therapy

## 2020-07-08 DIAGNOSIS — R2689 Other abnormalities of gait and mobility: Secondary | ICD-10-CM

## 2020-07-08 DIAGNOSIS — M6281 Muscle weakness (generalized): Secondary | ICD-10-CM

## 2020-07-08 DIAGNOSIS — M25552 Pain in left hip: Secondary | ICD-10-CM
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
## 2020-07-10 ENCOUNTER — Ambulatory Visit: Payer: Medicare Other | Admitting: Physical Therapy

## 2020-07-16 ENCOUNTER — Encounter: Payer: Self-pay | Admitting: Orthopaedic Surgery

## 2020-07-16 ENCOUNTER — Other Ambulatory Visit: Payer: Self-pay

## 2020-07-16 ENCOUNTER — Ambulatory Visit: Payer: Medicare Other | Admitting: Orthopaedic Surgery

## 2020-07-16 DIAGNOSIS — M25512 Pain in left shoulder: Secondary | ICD-10-CM | POA: Diagnosis not present

## 2020-07-16 DIAGNOSIS — M1612 Unilateral primary osteoarthritis, left hip: Secondary | ICD-10-CM

## 2020-07-16 DIAGNOSIS — G8929 Other chronic pain: Secondary | ICD-10-CM | POA: Diagnosis not present

## 2020-07-16 NOTE — Progress Notes (Signed)
The patient returns for follow-up after having an intra-articular steroid injection in his left shoulder by Dr. Junius Roads under ultrasound.  He still has a lot of grinding in that left shoulder joint and known glenohumeral osteoarthritis.  He said the shoulder injection and therapy has not really helped much.  He also has severe end-stage arthritis of his left hip.  He is interested in another steroid injection in that left hip joint.  He understands that that is usually done under direct fluoroscopy and we need to set him up for that type of injection.  He has had injections before that were done elsewhere.  He has a remote history of a right posterior hip replacement.  He still has a lot of grinding at the glenohumeral joint of the left shoulder but good range of motion overall.  He has trigger point pain and he wanted me to provide a trigger point injection today in the left trapezius area because that worked greatly and made his pain go away on the right side.  I did comply with that type of injection since he is now diabetic.  I would like to send him to my partner Dr. Marlou Sa for evaluation of his left shoulder for the possibility of shoulder replacement versus arthroscopic intervention.  At some point he will need a left hip replacement when he fails conservative treatment.  He is requesting another intra-articular steroid injection in the left hip so we will see if Dr. Ernestina Patches can provide him that at some point.  All question concerns were answered and addressed.

## 2020-07-18 ENCOUNTER — Other Ambulatory Visit: Payer: Self-pay | Admitting: Internal Medicine

## 2020-07-18 ENCOUNTER — Ambulatory Visit (INDEPENDENT_AMBULATORY_CARE_PROVIDER_SITE_OTHER): Payer: Medicare Other

## 2020-07-18 DIAGNOSIS — Z0000 Encounter for general adult medical examination without abnormal findings: Secondary | ICD-10-CM | POA: Diagnosis not present

## 2020-07-18 DIAGNOSIS — E785 Hyperlipidemia, unspecified: Secondary | ICD-10-CM

## 2020-07-18 DIAGNOSIS — M19071 Primary osteoarthritis, right ankle and foot: Secondary | ICD-10-CM

## 2020-07-18 DIAGNOSIS — M1611 Unilateral primary osteoarthritis, right hip: Secondary | ICD-10-CM
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
## 2020-07-18 NOTE — Patient Instructions (Signed)
[name] , Thank you for taking time to come for your Medicare Wellness Visit. I appreciate your ongoing commitment to your health goals. Please review the following plan we discussed and let me know if I can assist you in the future.   Screening recommendations/referrals: Colonoscopy: 04/17/2013; scheduled for 07/2020 per patient Recommended yearly ophthalmology/optometry visit for glaucoma screening and checkup Recommended yearly dental visit for hygiene and checkup  Vaccinations: Influenza vaccine: 02/11/2020 Pneumococcal vaccine: 03/17/2015, 01/29/2019 Tdap vaccine: overdue Shingles vaccine: never done; will check with local pharmacy   Covid-19: 05/31/2019, 06/21/2019, 04/22/2020  Advanced directives: Advance directive discussed with you today. Even though you declined this today please call our office should you change your mind and we can give you the proper paperwork for you to fill out.  Conditions/risks identified: Yes; Reviewed health maintenance screenings with patient today and relevant education, vaccines, and/or referrals were provided. Please continue to do your personal lifestyle choices by: daily care of teeth and gums, regular physical activity (goal should be 5 days a week for 30 minutes), eat a healthy diet, avoid tobacco and drug use, limiting any alcohol intake, taking a low-dose aspirin (if not allergic or have been advised by your provider otherwise) and taking vitamins and minerals as recommended by your provider. Continue doing brain stimulating activities (puzzles, reading, adult coloring books, staying active) to keep memory sharp. Continue to eat heart healthy diet (full of fruits, vegetables, whole grains, lean protein, water--limit salt, fat, and sugar intake) and increase physical activity as tolerated.  Next appointment: Please schedule your next Medicare Wellness Visit with your Nurse Health Advisor in 1 year by calling [phone].  Preventive Care 8 Years and  Older, Male Preventive care refers to lifestyle choices and visits with your health care provider that can promote health and wellness. What does preventive care include?  A yearly physical exam. This is also called an annual well check.  Dental exams once or twice a year.  Routine eye exams. Ask your health care provider how often you should have your eyes checked.  Personal lifestyle choices, including:  Daily care of your teeth and gums.  Regular physical activity.  Eating a healthy diet.  Avoiding tobacco and drug use.  Limiting alcohol use.  Practicing safe sex.  Taking low doses of aspirin every day.  Taking vitamin and mineral supplements as recommended by your health care provider. What happens during an annual well check? The services and screenings done by your health care provider during your annual well check will depend on your age, overall health, lifestyle risk factors, and family history of disease. Counseling  Your health care provider may ask you questions about your:  Alcohol use.  Tobacco use.  Drug use.  Emotional well-being.  Home and relationship well-being.  Sexual activity.  Eating habits.  History of falls.  Memory and ability to understand (cognition).  Work and work Statistician. Screening  You may have the following tests or measurements:  Height, weight, and BMI.  Blood pressure.  Lipid and cholesterol levels. These may be checked every 5 years, or more frequently if you are over 50 years old.  Skin check.  Lung cancer screening. You may have this screening every year starting at age 47 if you have a 30-pack-year history of smoking and currently smoke or have quit within the past 15 years.  Fecal occult blood test (FOBT) of the stool. You may have this test every year starting at age 80.  Flexible sigmoidoscopy or  colonoscopy. You may have a sigmoidoscopy every 5 years or a colonoscopy every 10 years starting at age  93.  Prostate cancer screening. Recommendations will vary depending on your family history and other risks.  Hepatitis C blood test.  Hepatitis B blood test.  Sexually transmitted disease (STD) testing.  Diabetes screening. This is done by checking your blood sugar (glucose) after you have not eaten for a while (fasting). You may have this done every 1-3 years.  Abdominal aortic aneurysm (AAA) screening. You may need this if you are a current or former smoker.  Osteoporosis. You may be screened starting at age 24 if you are at high risk. Talk with your health care provider about your test results, treatment options, and if necessary, the need for more tests. Vaccines  Your health care provider may recommend certain vaccines, such as:  Influenza vaccine. This is recommended every year.  Tetanus, diphtheria, and acellular pertussis (Tdap, Td) vaccine. You may need a Td booster every 10 years.  Zoster vaccine. You may need this after age 45.  Pneumococcal 13-valent conjugate (PCV13) vaccine. One dose is recommended after age 61.  Pneumococcal polysaccharide (PPSV23) vaccine. One dose is recommended after age 68. Talk to your health care provider about which screenings and vaccines you need and how often you need them. This information is not intended to replace advice given to you by your health care provider. Make sure you discuss any questions you have with your health care provider. Document Released: 05/09/2015 Document Revised: 12/31/2015 Document Reviewed: 02/11/2015 Elsevier Interactive Patient Education  2017 [employer] Prevention in the Home Falls can cause injuries. They can happen to people of all ages. There are many things you can do to make your home safe and to help prevent falls. What can I do on the outside of my home?  Regularly fix the edges of walkways and driveways and fix any cracks.  Remove anything that might make you trip as you walk through a  door, such as a raised step or threshold.  Trim any bushes or trees on the path to your home.  Use bright outdoor lighting.  Clear any walking paths of anything that might make someone trip, such as rocks or tools.  Regularly check to see if handrails are loose or broken. Make sure that both sides of any steps have handrails.  Any raised decks and porches should have guardrails on the edges.  Have any leaves, snow, or ice cleared regularly.  Use sand or salt on walking paths during winter.  Clean up any spills in your garage right away. This includes oil or grease spills. What can I do in the bathroom?  Use night lights.  Install grab bars by the toilet and in the tub and shower. Do not use towel bars as grab bars.  Use non-skid mats or decals in the tub or shower.  If you need to sit down in the shower, use a plastic, non-slip stool.  Keep the floor dry. Clean up any water that spills on the floor as soon as it happens.  Remove soap buildup in the tub or shower regularly.  Attach bath mats securely with double-sided non-slip rug tape.  Do not have throw rugs and other things on the floor that can make you trip. What can I do in the bedroom?  Use night lights.  Make sure that you have a light by your bed that is easy to reach.  Do not use any  sheets or blankets that are too big for your bed. They should not hang down onto the floor.  Have a firm chair that has side arms. You can use this for support while you get dressed.  Do not have throw rugs and other things on the floor that can make you trip. What can I do in the kitchen?  Clean up any spills right away.  Avoid walking on wet floors.  Keep items that you use a lot in easy-to-reach places.  If you need to reach something above you, use a strong step stool that has a grab bar.  Keep electrical cords out of the way.  Do not use floor polish or wax that makes floors slippery. If you must use wax, use  non-skid floor wax.  Do not have throw rugs and other things on the floor that can make you trip. What can I do with my stairs?  Do not leave any items on the stairs.  Make sure that there are handrails on both sides of the stairs and use them. Fix handrails that are broken or loose. Make sure that handrails are as long as the stairways.  Check any carpeting to make sure that it is firmly attached to the stairs. Fix any carpet that is loose or worn.  Avoid having throw rugs at the top or bottom of the stairs. If you do have throw rugs, attach them to the floor with carpet tape.  Make sure that you have a light switch at the top of the stairs and the bottom of the stairs. If you do not have them, ask someone to add them for you. What else can I do to help prevent falls?  Wear shoes that:  Do not have high heels.  Have rubber bottoms.  Are comfortable and fit you well.  Are closed at the toe. Do not wear sandals.  If you use a stepladder:  Make sure that it is fully opened. Do not climb a closed stepladder.  Make sure that both sides of the stepladder are locked into place.  Ask someone to hold it for you, if possible.  Clearly mark and make sure that you can see:  Any grab bars or handrails.  First and last steps.  Where the edge of each step is.  Use tools that help you move around (mobility aids) if they are needed. These include:  Canes.  Walkers.  Scooters.  Crutches.  Turn on the lights when you go into a dark area. Replace any light bulbs as soon as they burn out.  Set up your furniture so you have a clear path. Avoid moving your furniture around.  If any of your floors are uneven, fix them.  If there are any pets around you, be aware of where they are.  Review your medicines with your doctor. Some medicines can make you feel dizzy. This can increase your chance of falling. Ask your doctor what other things that you can do to help prevent falls. This  information is not intended to replace advice given to you by your health care provider. Make sure you discuss any questions you have with your health care provider. Document Released: 02/06/2009 Document Revised: 09/18/2015 Document Reviewed: 05/17/2014 Elsevier Interactive Patient Education  2017 [employer].

## 2020-07-19 ENCOUNTER — Other Ambulatory Visit: Payer: Self-pay | Admitting: Internal Medicine

## 2020-07-19 DIAGNOSIS — E785 Hyperlipidemia, unspecified: Secondary | ICD-10-CM

## 2020-07-19 MED ORDER — LIVALO 1 MG PO TABS: 1.0000 | ORAL_TABLET | Freq: Every day | ORAL | 1 refills | Status: DC

## 2020-07-21 ENCOUNTER — Other Ambulatory Visit: Payer: Self-pay | Admitting: Internal Medicine

## 2020-07-21 DIAGNOSIS — I10 Essential (primary) hypertension: Secondary | ICD-10-CM

## 2020-07-21 MED ORDER — CLONIDINE HCL 0.1 MG PO TABS: 0.1000 mg | ORAL_TABLET | Freq: Two times a day (BID) | ORAL | 1 refills | Status: DC

## 2020-07-23 ENCOUNTER — Telehealth: Payer: Self-pay | Admitting: Orthopedic Surgery

## 2020-07-23 ENCOUNTER — Telehealth: Payer: Self-pay | Admitting: Orthopaedic Surgery

## 2020-07-23 NOTE — Telephone Encounter (Signed)
Please advise 

## 2020-07-23 NOTE — Telephone Encounter (Signed)
Wife aware of below message °

## 2020-07-23 NOTE — Telephone Encounter (Signed)
Patient's wife Holley Raring called requesting percocet 10mg  for husband. She states he is severe pain and unable to get around the house with walker due to pain. She states that is the only medication he use to take that helped his pain. Please send prescription to [facility] on [address]. Phone number is [phone].

## 2020-07-23 NOTE — Telephone Encounter (Signed)
Unfortunately I do not put anybody on Percocet tens or any other narcotics for arthritis.  I would put him on narcotics for postoperative recovery.

## 2020-07-24 ENCOUNTER — Ambulatory Visit: Payer: Medicare Other | Admitting: Physical Therapy

## 2020-07-28 ENCOUNTER — Ambulatory Visit: Payer: Medicare Other | Admitting: Physician Assistant

## 2020-07-28 ENCOUNTER — Encounter: Payer: Self-pay | Admitting: Physician Assistant

## 2020-07-28 VITALS — BP 148/80 | HR 88 | Ht 67.0 in | Wt 153.8 lb

## 2020-07-28 DIAGNOSIS — R1310 Dysphagia, unspecified: Secondary | ICD-10-CM | POA: Diagnosis not present

## 2020-07-28 DIAGNOSIS — Z8719 Personal history of other diseases of the digestive system: Secondary | ICD-10-CM | POA: Diagnosis not present

## 2020-07-28 DIAGNOSIS — Z86010 Personal history of colonic polyps: Secondary | ICD-10-CM | POA: Diagnosis not present

## 2020-07-28 MED ORDER — OMEPRAZOLE 40 MG PO CPDR: 40.0000 mg | DELAYED_RELEASE_CAPSULE | Freq: Every day | ORAL | 11 refills | Status: DC

## 2020-07-28 MED ORDER — PLENVU 140 G PO SOLR: 1.0000 | ORAL | 0 refills | Status: DC

## 2020-07-28 NOTE — Patient Instructions (Addendum)
If you are age 77 or older, your body mass index should be between 23-30. Your Body mass index is 24.09 kg/m. If this is out of the aforementioned range listed, please consider follow up with your Primary Care Provider.  If you are age 3 or younger, your body mass index should be between 19-25. Your Body mass index is 24.09 kg/m. If this is out of the aformentioned range listed, please consider follow up with your Primary Care Provider.   You have been scheduled for an endoscopy and colonoscopy. Please follow the written instructions given to you at your visit today. Please pick up your prep supplies at the pharmacy within the next 1-3 days. If you use inhalers (even only as needed), please bring them with you on the day of your procedure.  Start Omeprazole 40 mg 1 capsule before breakfast.  Follow up pending the results of your Endoscopy and Colonoscopy or as needed.  Thank you for entrusting me with your care and choosing [facility].  Amy Esterwood, PA-C

## 2020-07-28 NOTE — Progress Notes (Signed)
Agree with assessment and plan as outlined.  
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
## 2020-07-29 ENCOUNTER — Encounter: Payer: Medicare Other | Admitting: Physical Therapy

## 2020-07-30 ENCOUNTER — Ambulatory Visit: Payer: Self-pay

## 2020-07-30 ENCOUNTER — Ambulatory Visit: Payer: Medicare Other | Admitting: Orthopedic Surgery

## 2020-07-30 DIAGNOSIS — G8929 Other chronic pain: Secondary | ICD-10-CM

## 2020-07-30 DIAGNOSIS — M25512 Pain in left shoulder: Secondary | ICD-10-CM

## 2020-07-30 DIAGNOSIS — R29898 Other symptoms and signs involving the musculoskeletal system: Secondary | ICD-10-CM

## 2020-07-30 DIAGNOSIS — M19019 Primary osteoarthritis, unspecified shoulder: Secondary | ICD-10-CM

## 2020-08-02 ENCOUNTER — Encounter: Payer: Self-pay | Admitting: Orthopedic Surgery
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
## 2020-08-03 ENCOUNTER — Encounter: Payer: Self-pay | Admitting: Orthopedic Surgery

## 2020-08-03 DIAGNOSIS — M19012 Primary osteoarthritis, left shoulder: Secondary | ICD-10-CM

## 2020-08-03 MED ORDER — METHYLPREDNISOLONE ACETATE 40 MG/ML IJ SUSP
13.3300 mg | INTRAMUSCULAR | Status: AC | PRN
  Administered 2020-08-03: 13.33 mg via INTRA_ARTICULAR

## 2020-08-03 MED ORDER — BUPIVACAINE HCL 0.25 % IJ SOLN
0.6600 mL | INTRAMUSCULAR | Status: AC | PRN
  Administered 2020-08-03: .66 mL via INTRA_ARTICULAR

## 2020-08-03 MED ORDER — LIDOCAINE HCL 1 % IJ SOLN
3.0000 mL | INTRAMUSCULAR | Status: AC | PRN
  Administered 2020-08-03: 3 mL

## 2020-08-04 ENCOUNTER — Encounter: Payer: Self-pay | Admitting: Physical Medicine and Rehabilitation

## 2020-08-04 ENCOUNTER — Ambulatory Visit: Payer: Self-pay

## 2020-08-04 ENCOUNTER — Ambulatory Visit (INDEPENDENT_AMBULATORY_CARE_PROVIDER_SITE_OTHER): Payer: Medicare Other | Admitting: Physical Medicine and Rehabilitation

## 2020-08-04 DIAGNOSIS — M1612 Unilateral primary osteoarthritis, left hip: Secondary | ICD-10-CM

## 2020-08-04 MED ORDER — TRIAMCINOLONE ACETONIDE 40 MG/ML IJ SUSP
60.0000 mg | INTRAMUSCULAR | Status: AC | PRN
  Administered 2020-08-04: 60 mg via INTRA_ARTICULAR

## 2020-08-04 MED ORDER — BUPIVACAINE HCL 0.25 % IJ SOLN
4.0000 mL | INTRAMUSCULAR | Status: AC | PRN
  Administered 2020-08-04: 4 mL via INTRA_ARTICULAR

## 2020-08-04 NOTE — Progress Notes (Signed)
   Curtis Vance - 75 y.o. male MRN [id number]  Date of birth: 11/17/1943  Office Visit Note: Visit Date: 08/04/2020 PCP: Janith Lima, MD Referred by: Janith Lima, MD  Subjective: Chief Complaint  Patient presents with  . Left Hip - Pain   HPI:  Curtis Vance is a 75 y.o. male who comes in today at the request of Dr. Jean Rosenthal for planned Left anesthetic hip arthrogram with fluoroscopic guidance.  The patient has failed conservative care including home exercise, medications, time and activity modification.  This injection will be diagnostic and hopefully therapeutic.  Please see requesting physician notes for further details and justification.   ROS Otherwise per HPI.  Assessment & Plan: Visit Diagnoses:    ICD-10-CM   1. Arthritis of left hip  M16.12 XR C-ARM NO REPORT    Large Joint Inj: L hip joint    Plan: No additional findings.   Meds & Orders: No orders of the defined types were placed in this encounter.   Orders Placed This Encounter  Procedures  . Large Joint Inj: L hip joint  . XR C-ARM NO REPORT    Follow-up: No follow-ups on file.   Procedures: Large Joint Inj: L hip joint on 08/04/2020 1:10 PM Indications: pain and diagnostic evaluation Details: 22 G needle, anterior approach  Arthrogram: Yes  Medications: 4 mL bupivacaine 0.25 %; 60 mg triamcinolone acetonide 40 MG/ML Outcome: tolerated well, no immediate complications  Arthrogram demonstrated excellent flow of contrast throughout the joint surface without extravasation or obvious defect.  The patient had relief of symptoms during the anesthetic phase of the injection.  Procedure, treatment alternatives, risks and benefits explained, specific risks discussed. Consent was given by the patient. Immediately prior to procedure a time out was called to verify the correct patient, procedure, equipment, support staff and site/side marked as required. Patient was prepped and draped in the  usual sterile fashion.          Clinical History: No specialty comments available.     Objective:  VS:  HT:    WT:   BMI:     BP:   HR: bpm  TEMP: ( )  RESP:  Physical Exam   Imaging: No results found.

## 2020-08-04 NOTE — Progress Notes (Signed)
Pain in lateral left thigh. Pain is worse with walking and twisting. No pain when sitting or lying down. Numeric Pain Rating Scale and Functional Assessment Average Pain 9   In the last MONTH (on 0-10 scale) has pain interfered with the following?  1. General activity like being  able to carry out your everyday physical activities such as walking, climbing stairs, carrying groceries, or moving a chair?  Rating(10)   +Driver, -BT, -Dye Allergies.

## 2020-08-05 ENCOUNTER — Encounter: Payer: Medicare Other | Admitting: Physical Therapy

## 2020-08-07 ENCOUNTER — Other Ambulatory Visit: Payer: Self-pay | Admitting: Internal Medicine

## 2020-08-07 DIAGNOSIS — I10 Essential (primary) hypertension: Secondary | ICD-10-CM

## 2020-08-27 ENCOUNTER — Other Ambulatory Visit: Payer: Self-pay | Admitting: Ophthalmology

## 2020-08-27 DIAGNOSIS — E0500 Thyrotoxicosis with diffuse goiter without thyrotoxic crisis or storm: Secondary | ICD-10-CM

## 2020-09-08 ENCOUNTER — Ambulatory Visit: Payer: Medicare Other | Admitting: Orthopedic Surgery

## 2020-09-10 ENCOUNTER — Other Ambulatory Visit: Payer: Self-pay

## 2020-09-10 ENCOUNTER — Ambulatory Visit
Admission: RE | Admit: 2020-09-10 | Discharge: 2020-09-10 | Disposition: A | Discharge status: Home / Self Care | Payer: Medicare Other | Source: Ambulatory Visit | Attending: Ophthalmology | Admitting: Ophthalmology

## 2020-09-10 DIAGNOSIS — E0500 Thyrotoxicosis with diffuse goiter without thyrotoxic crisis or storm: Secondary | ICD-10-CM

## 2020-09-17 ENCOUNTER — Other Ambulatory Visit: Payer: Self-pay

## 2020-09-17 ENCOUNTER — Ambulatory Visit
Admission: RE | Admit: 2020-09-17 | Discharge: 2020-09-17 | Disposition: A | Discharge status: Home / Self Care | Payer: Medicare Other | Source: Ambulatory Visit | Attending: Orthopedic Surgery | Admitting: Orthopedic Surgery

## 2020-09-17 DIAGNOSIS — G8929 Other chronic pain: Secondary | ICD-10-CM

## 2020-09-17 DIAGNOSIS — M25512 Pain in left shoulder: Secondary | ICD-10-CM

## 2020-09-17 MED ORDER — IOPAMIDOL (ISOVUE-M 200) INJECTION 41%
13.0000 mL | Freq: Once | INTRAMUSCULAR | Status: AC
  Administered 2020-09-17: 13 mL via INTRA_ARTICULAR

## 2020-09-24 ENCOUNTER — Ambulatory Visit: Payer: Medicare Other | Admitting: Orthopedic Surgery

## 2020-09-24 DIAGNOSIS — M19019 Primary osteoarthritis, unspecified shoulder: Secondary | ICD-10-CM | POA: Diagnosis not present

## 2020-10-04 ENCOUNTER — Encounter: Payer: Self-pay | Admitting: Orthopedic Surgery
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
## 2020-10-08 ENCOUNTER — Encounter: Payer: Self-pay | Admitting: Gastroenterology

## 2020-10-10 ENCOUNTER — Other Ambulatory Visit: Payer: Self-pay

## 2020-10-10 ENCOUNTER — Encounter: Payer: Self-pay | Admitting: Gastroenterology

## 2020-10-10 ENCOUNTER — Ambulatory Visit (AMBULATORY_SURGERY_CENTER): Payer: Medicare Other | Admitting: Gastroenterology

## 2020-10-10 VITALS — BP 156/80 | HR 52 | Temp 97.6°F | Resp 15 | Ht 67.0 in | Wt 153.0 lb

## 2020-10-10 DIAGNOSIS — K209 Esophagitis, unspecified without bleeding: Secondary | ICD-10-CM | POA: Diagnosis not present

## 2020-10-10 DIAGNOSIS — K635 Polyp of colon: Secondary | ICD-10-CM

## 2020-10-10 DIAGNOSIS — K5900 Constipation, unspecified: Secondary | ICD-10-CM

## 2020-10-10 DIAGNOSIS — K31819 Angiodysplasia of stomach and duodenum without bleeding: Secondary | ICD-10-CM

## 2020-10-10 DIAGNOSIS — Z86010 Personal history of colonic polyps: Secondary | ICD-10-CM | POA: Diagnosis not present

## 2020-10-10 DIAGNOSIS — D122 Benign neoplasm of ascending colon: Secondary | ICD-10-CM

## 2020-10-10 DIAGNOSIS — D123 Benign neoplasm of transverse colon: Secondary | ICD-10-CM

## 2020-10-10 DIAGNOSIS — K222 Esophageal obstruction: Secondary | ICD-10-CM

## 2020-10-10 DIAGNOSIS — R1310 Dysphagia, unspecified: Secondary | ICD-10-CM

## 2020-10-10 DIAGNOSIS — D125 Benign neoplasm of sigmoid colon: Secondary | ICD-10-CM

## 2020-10-10 MED ORDER — FLEET ENEMA 7-19 GM/118ML RE ENEM
1.0000 | ENEMA | Freq: Once | RECTAL | Status: AC
  Administered 2020-10-10: 1 via RECTAL

## 2020-10-10 MED ORDER — SODIUM CHLORIDE 0.9 % IV SOLN: 500.0000 mL | Freq: Once | INTRAVENOUS | Status: DC

## 2020-10-10 NOTE — Progress Notes (Signed)
Called to room to assist during endoscopic procedure.  Patient ID and intended procedure confirmed with present staff. Received instructions for my participation in the procedure from the performing physician.  

## 2020-10-10 NOTE — Progress Notes (Addendum)
10:40, pt states his prep didn't begin to work until 0100, and had some results, but stool is still dark with pieces in it.  10:45, enema given per rectum as ordered by Dr. Havery Moros.  Results:Dark yellow liquid with sediment in bottom of commode.  History reviewed today.

## 2020-10-10 NOTE — Progress Notes (Signed)
PT taken to PACU. Monitors in place. VSS. Report given to RN. 

## 2020-10-10 NOTE — Op Note (Signed)
[place] Patient Name: Curtis Vance Procedure Date: 10/10/2020 11:10 AM MRN: [id number] Endoscopist: Remo Lipps P. Havery Moros , MD Age: 75 Referring MD:  Date of Birth: 11/17/1943 Gender: Male Account #: [id number] Procedure:                Colonoscopy Indications:              High risk colon cancer surveillance: Personal                            history of colonic polyps (adenoma 2014), endorses                            chronic constipation Medicines:                Monitored Anesthesia Care Procedure:                Pre-Anesthesia Assessment:                           - Prior to the procedure, a History and Physical                            was performed, and patient medications and                            allergies were reviewed. The patient's tolerance of                            previous anesthesia was also reviewed. The risks                            and benefits of the procedure and the sedation                            options and risks were discussed with the patient.                            All questions were answered, and informed consent                            was obtained. Prior Anticoagulants: The patient has                            taken no previous anticoagulant or antiplatelet                            agents. ASA Grade Assessment: II - A patient with                            mild systemic disease. After reviewing the risks                            and benefits, the patient was deemed in  satisfactory condition to undergo the procedure.                           After obtaining informed consent, the colonoscope                            was passed under direct vision. Throughout the                            procedure, the patient's blood pressure, pulse, and                            oxygen saturations were monitored continuously. The                            Colonoscope was introduced through  the anus and                            advanced to the the cecum, identified by                            appendiceal orifice and ileocecal valve. The                            colonoscopy was performed without difficulty. The                            patient tolerated the procedure well. The quality                            of the bowel preparation was adequate. The                            ileocecal valve, appendiceal orifice, and rectum                            were photographed. Scope In: 11:40:41 AM Scope Out: 12:01:59 PM Scope Withdrawal Time: 0 hours 15 minutes 22 seconds  Total Procedure Duration: 0 hours 21 minutes 18 seconds  Findings:                 A small posterior midline anal fissure was found on                            perianal exam, along with a skin tag.                           Two sessile polyps were found in the ascending                            colon. The polyps were 3 mm in size. These polyps                            were removed with a cold snare. Resection and  retrieval were complete.                           Two sessile polyps were found in the hepatic                            flexure. The polyps were 3 mm in size. These polyps                            were removed with a cold snare. Resection and                            retrieval were complete.                           A 3 mm polyp was found in the transverse colon. The                            polyp was sessile. The polyp was removed with a                            cold snare. Resection and retrieval were complete.                           A 4 mm polyp was found in the sigmoid colon. The                            polyp was sessile. The polyp was removed with a                            cold snare. Resection and retrieval were complete.                           Internal hemorrhoids were found.                           A large amount of liquid  stool was found in the                            entire colon, making visualization difficult.                            Lavage of the colon was performed using copious                            amounts of sterile water, resulting in clearance                            with adequate visualization.                           The exam was otherwise without abnormality. Of  note, given the small size of the rectum,                            retroflexed views were not obtained. Complications:            No immediate complications. Estimated blood loss:                            Minimal. Estimated Blood Loss:     Estimated blood loss was minimal. Impression:               - Small anal fissure found on perianal exam, skin                            tag.                           - Two 3 mm polyps in the ascending colon, removed                            with a cold snare. Resected and retrieved.                           - Two 3 mm polyps at the hepatic flexure, removed                            with a cold snare. Resected and retrieved.                           - One 3 mm polyp in the transverse colon, removed                            with a cold snare. Resected and retrieved.                           - One 4 mm polyp in the sigmoid colon, removed with                            a cold snare. Resected and retrieved.                           - Internal hemorrhoids.                           - Stool in the entire examined colon requiring                            extensive lavage.                           - The examination was otherwise normal. Recommendation:           - Patient has a contact number available for  emergencies. The signs and symptoms of potential                            delayed complications were discussed with the                            patient. Return to normal activities tomorrow.                             Written discharge instructions were provided to the                            patient.                           - Resume previous diet.                           - Continue present medications.                           - If not taking anything for constipation,                            recommend Miralax twice daily and titrate up as                            needed (if not tried yet). Anal fissure is small,                            hopefully responds to constipation management, if                            symptoms persist recommend topical diltiazem or                            nitroglycerin ointment PR                           - Await pathology results. Remo Lipps P. Riyanshi Wahab, MD 10/10/2020 12:09:16 PM This report has been signed electronically.

## 2020-10-10 NOTE — Op Note (Signed)
[place] Patient Name: Curtis Vance Procedure Date: 10/10/2020 11:10 AM MRN: [id number] Endoscopist: Remo Lipps P. Havery Moros , MD Age: 75 Referring MD:  Date of Birth: 11/17/1943 Gender: Male Account #: [id number] Procedure:                Upper GI endoscopy Indications:              Dysphagia Medicines:                Monitored Anesthesia Care Procedure:                Pre-Anesthesia Assessment:                           - Prior to the procedure, a History and Physical                            was performed, and patient medications and                            allergies were reviewed. The patient's tolerance of                            previous anesthesia was also reviewed. The risks                            and benefits of the procedure and the sedation                            options and risks were discussed with the patient.                            All questions were answered, and informed consent                            was obtained. Prior Anticoagulants: The patient has                            taken no previous anticoagulant or antiplatelet                            agents. ASA Grade Assessment: II - A patient with                            mild systemic disease. After reviewing the risks                            and benefits, the patient was deemed in                            satisfactory condition to undergo the procedure.                           After obtaining informed consent, the endoscope was  passed under direct vision. Throughout the                            procedure, the patient's blood pressure, pulse, and                            oxygen saturations were monitored continuously. The                            Endoscope was introduced through the mouth, and                            advanced to the second part of duodenum. The upper                            GI endoscopy was accomplished without  difficulty.                            The patient tolerated the procedure well. Scope In: Scope Out: Findings:                 Esophagogastric landmarks were identified: the                            Z-line was found at 39 cm, the gastroesophageal                            junction was found at 39 cm and the upper extent of                            the gastric folds was found at 41 cm from the                            incisors.                           A 2 cm hiatal hernia was present.                           One benign-appearing, intrinsic mild stenosis was                            found 39 cm from the incisors. This stenosis                            measured less than one cm (in length). The stenosis                            was traversed. A TTS dilator was passed through the                            scope. Dilation with an 18-19-20 mm balloon dilator  was performed to 18 mm, 19 mm and 20 mm after which                            multiple small mucosal wrents were noted. Biopsy                            forceps was used to open the stricture further.                           Mild esophagitis was found in the distal esophagus.                           The exam of the esophagus was otherwise normal.                           Patchy mildly erythematous mucosa was found in the                            gastric antrum.                           The exam of the stomach was otherwise normal.                           Biopsies were taken with a cold forceps in the                            gastric body, at the incisura and in the gastric                            antrum for Helicobacter pylori testing.                           A single small angiodysplastic lesion without                            bleeding was found in the duodenal bulb.                           The exam of the duodenum was otherwise normal. Complications:            No  immediate complications. Estimated blood loss:                            Minimal. Estimated Blood Loss:     Estimated blood loss was minimal. Impression:               - Esophagogastric landmarks identified.                           - 2 cm hiatal hernia.                           - Benign-appearing esophageal stenosis. Dilated to  85mm with good result.                           - Mild distal esophagitis.                           - Erythematous mucosa in the antrum.                           - Normal stomach otherwise - biopsies taken to rule                            out H pylori                           - A single non-bleeding angiodysplastic lesion in                            the duodenum.                           - Normal duodenum otherwise Recommendation:           - Patient has a contact number available for                            emergencies. The signs and symptoms of potential                            delayed complications were discussed with the                            patient. Return to normal activities tomorrow.                            Written discharge instructions were provided to the                            patient.                           - Post dilation diet.                           - Continue present medications.                           - Continue omeprazole as prescribed to treat                            esophagitis and prevent restricturing (recently                            prescribed)                           - Await pathology results and course post dilation. Remo Lipps P. Shanon Seawright, MD 10/10/2020 12:15:33 PM This report  has been signed electronically.

## 2020-10-10 NOTE — Patient Instructions (Signed)
Follow the post dilation diet for the remainder of today. Take Miralax twice per day for constipation.   YOU HAD AN ENDOSCOPIC PROCEDURE TODAY AT [facility]:   Refer to the procedure report that was given to you for any specific questions about what was found during the examination.  If the procedure report does not answer your questions, please call your gastroenterologist to clarify.  If you requested that your care partner not be given the details of your procedure findings, then the procedure report has been included in a sealed envelope for you to review at your convenience later.  YOU SHOULD EXPECT: Some feelings of bloating in the abdomen. Passage of more gas than usual.  Walking can help get rid of the air that was put into your GI tract during the procedure and reduce the bloating. If you had a lower endoscopy (such as a colonoscopy or flexible sigmoidoscopy) you may notice spotting of blood in your stool or on the toilet paper. If you underwent a bowel prep for your procedure, you may not have a normal bowel movement for a few days.  Please Note:  You might notice some irritation and congestion in your nose or some drainage.  This is from the oxygen used during your procedure.  There is no need for concern and it should clear up in a day or so.  SYMPTOMS TO REPORT IMMEDIATELY:  Following lower endoscopy (colonoscopy or flexible sigmoidoscopy):  Excessive amounts of blood in the stool  Significant tenderness or worsening of abdominal pains  Swelling of the abdomen that is new, acute  Fever of 100F or higher  Following upper endoscopy (EGD)  Vomiting of blood or coffee ground material  New chest pain or pain under the shoulder blades  Painful or persistently difficult swallowing  New shortness of breath  Fever of 100F or higher  Black, tarry-looking stools  For urgent or emergent issues, a gastroenterologist can be reached at any hour by calling [phone]. Do  not use MyChart messaging for urgent concerns.   ACTIVITY:  You should plan to take it easy for the rest of today and you should NOT DRIVE or use heavy machinery until tomorrow (because of the sedation medicines used during the test).    FOLLOW UP: Our staff will call the number listed on your records 48-72 hours following your procedure to check on you and address any questions or concerns that you may have regarding the information given to you following your procedure. If we do not reach you, we will leave a message.  We will attempt to reach you two times.  During this call, we will ask if you have developed any symptoms of COVID 19. If you develop any symptoms (ie: fever, flu-like symptoms, shortness of breath, cough etc.) before then, please call [phone].  If you test positive for Covid 19 in the 2 weeks post procedure, please call and report this information to [place].    If any biopsies were taken you will be contacted by phone or by letter within the next 1-3 weeks.  Please call us at [phone] if you have not heard about the biopsies in 3 weeks.    SIGNATURES/CONFIDENTIALITY: You and/or your care partner have signed paperwork which will be entered into your electronic medical record.  These signatures attest to the fact that that the information above on your After Visit Summary has been reviewed and is understood.  Full responsibility of the confidentiality of this  discharge information lies with you and/or your care-partner.  

## 2020-10-14 ENCOUNTER — Telehealth: Payer: Self-pay

## 2020-10-14 NOTE — Telephone Encounter (Signed)
  Follow up Call-  Call back number 10/10/2020  Post procedure Call Back phone  # [phone]  Permission to leave phone message Yes  Some recent data might be hidden     Patient questions:  Do you have a fever, pain , or abdominal swelling? No. Pain Score  0 *  Have you tolerated food without any problems? Yes.    Have you been able to return to your normal activities? Yes.    Do you have any questions about your discharge instructions: Diet   No. Medications  No. Follow up visit  No.  Do you have questions or concerns about your Care? No.  Actions: * If pain score is 4 or above: No action needed, pain <4.  Have you developed a fever since your procedure? no  2.   Have you had an respiratory symptoms (SOB or cough) since your procedure? no  3.   Have you tested positive for COVID 19 since your procedure no  4.   Have you had any family members/close contacts diagnosed with the COVID 19 since your procedure?  no   If yes to any of these questions please route to Joylene John, RN and Joella Prince, RN

## 2020-10-31 ENCOUNTER — Other Ambulatory Visit: Payer: Self-pay | Admitting: Internal Medicine

## 2020-11-24 ENCOUNTER — Other Ambulatory Visit: Payer: Self-pay

## 2020-11-24 ENCOUNTER — Ambulatory Visit: Payer: Medicare Other | Admitting: Orthopedic Surgery

## 2020-11-24 DIAGNOSIS — M19012 Primary osteoarthritis, left shoulder: Secondary | ICD-10-CM | POA: Diagnosis not present

## 2020-11-24 DIAGNOSIS — M19011 Primary osteoarthritis, right shoulder: Secondary | ICD-10-CM

## 2020-11-24 DIAGNOSIS — M19019 Primary osteoarthritis, unspecified shoulder: Secondary | ICD-10-CM

## 2020-11-27 ENCOUNTER — Other Ambulatory Visit: Payer: Self-pay

## 2020-11-27 ENCOUNTER — Telehealth (INDEPENDENT_AMBULATORY_CARE_PROVIDER_SITE_OTHER): Payer: Medicare Other | Admitting: Family Medicine

## 2020-11-27 ENCOUNTER — Encounter: Payer: Self-pay | Admitting: Family Medicine

## 2020-11-27 DIAGNOSIS — R059 Cough, unspecified: Secondary | ICD-10-CM

## 2020-11-27 MED ORDER — DOXYCYCLINE HYCLATE 100 MG PO TABS: 100.0000 mg | ORAL_TABLET | Freq: Two times a day (BID) | ORAL | 0 refills | Status: DC

## 2020-11-27 MED ORDER — BENZONATATE 100 MG PO CAPS: 100.0000 mg | ORAL_CAPSULE | Freq: Three times a day (TID) | ORAL | 0 refills | Status: DC | PRN
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
## 2020-11-27 NOTE — Patient Instructions (Signed)
-  I sent the medication(s) we discussed to your pharmacy: Meds ordered this encounter  Medications   doxycycline (VIBRA-TABS) 100 MG tablet    Sig: Take 1 tablet (100 mg total) by mouth 2 (two) times daily.    Dispense:  14 tablet    Refill:  0   benzonatate (TESSALON PERLES) 100 MG capsule    Sig: Take 1 capsule (100 mg total) by mouth 3 (three) times daily as needed.    Dispense:  20 capsule    Refill:  0     I hope you are feeling better soon!  Seek in person care promptly if your symptoms worsen, new concerns arise or you are not improving with treatment.  It was nice to meet you today. I help [facility] out with telemedicine visits on Tuesdays and Thursdays and am available for visits on those days. If you have any concerns or questions following this visit please schedule a follow up visit with your Primary Care doctor or seek care at a local urgent care clinic to avoid delays in care.

## 2020-11-30 ENCOUNTER — Encounter: Payer: Self-pay | Admitting: Orthopedic Surgery

## 2020-11-30 MED ORDER — METHYLPREDNISOLONE ACETATE 40 MG/ML IJ SUSP
13.3300 mg | INTRAMUSCULAR | Status: AC | PRN
  Administered 2020-11-24: 13.33 mg via INTRA_ARTICULAR

## 2020-11-30 MED ORDER — LIDOCAINE HCL 1 % IJ SOLN
3.0000 mL | INTRAMUSCULAR | Status: AC | PRN
  Administered 2020-11-24: 3 mL

## 2020-11-30 MED ORDER — BUPIVACAINE HCL 0.25 % IJ SOLN
0.6600 mL | INTRAMUSCULAR | Status: AC | PRN
  Administered 2020-11-24: .66 mL via INTRA_ARTICULAR
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
## 2021-02-19 LAB — TSH: TSH: 0.6 (ref 0.41–5.90)

## 2021-02-25 ENCOUNTER — Ambulatory Visit (INDEPENDENT_AMBULATORY_CARE_PROVIDER_SITE_OTHER): Payer: Medicare Other

## 2021-02-25 ENCOUNTER — Ambulatory Visit (INDEPENDENT_AMBULATORY_CARE_PROVIDER_SITE_OTHER): Payer: Medicare Other | Admitting: Internal Medicine

## 2021-02-25 ENCOUNTER — Other Ambulatory Visit: Payer: Self-pay

## 2021-02-25 ENCOUNTER — Encounter: Payer: Self-pay | Admitting: Internal Medicine

## 2021-02-25 VITALS — BP 122/76 | HR 75 | Temp 97.8°F | Ht 67.0 in | Wt 156.0 lb

## 2021-02-25 DIAGNOSIS — E785 Hyperlipidemia, unspecified: Secondary | ICD-10-CM | POA: Diagnosis not present

## 2021-02-25 DIAGNOSIS — K2100 Gastro-esophageal reflux disease with esophagitis, without bleeding: Secondary | ICD-10-CM | POA: Diagnosis not present

## 2021-02-25 DIAGNOSIS — R310 Gross hematuria: Secondary | ICD-10-CM | POA: Diagnosis not present

## 2021-02-25 DIAGNOSIS — N401 Enlarged prostate with lower urinary tract symptoms: Secondary | ICD-10-CM

## 2021-02-25 DIAGNOSIS — T402X5A Adverse effect of other opioids, initial encounter: Secondary | ICD-10-CM | POA: Diagnosis not present

## 2021-02-25 DIAGNOSIS — Z0000 Encounter for general adult medical examination without abnormal findings: Secondary | ICD-10-CM

## 2021-02-25 DIAGNOSIS — R053 Chronic cough: Secondary | ICD-10-CM | POA: Insufficient documentation

## 2021-02-25 DIAGNOSIS — M159 Polyosteoarthritis, unspecified: Secondary | ICD-10-CM | POA: Diagnosis not present

## 2021-02-25 DIAGNOSIS — K5903 Drug induced constipation: Secondary | ICD-10-CM | POA: Diagnosis not present

## 2021-02-25 DIAGNOSIS — I10 Essential (primary) hypertension: Secondary | ICD-10-CM | POA: Diagnosis not present

## 2021-02-25 DIAGNOSIS — J418 Mixed simple and mucopurulent chronic bronchitis: Secondary | ICD-10-CM

## 2021-02-25 LAB — BASIC METABOLIC PANEL
BUN: 17 mg/dL (ref 6–23)
CO2: 34 mEq/L — ABNORMAL HIGH (ref 19–32)
Calcium: 9.7 mg/dL (ref 8.4–10.5)
Chloride: 99 mEq/L (ref 96–112)
Creatinine, Ser: 1.01 mg/dL (ref 0.40–1.50)
GFR: 70.85 mL/min (ref >60.00)
Glucose, Bld: 85 mg/dL (ref 70–99)
Potassium: 3.8 mEq/L (ref 3.5–5.1)
Sodium: 139 mEq/L (ref 135–145)

## 2021-02-25 LAB — HEPATIC FUNCTION PANEL
ALT: 15 U/L (ref 0–53)
AST: 27 U/L (ref 0–37)
Albumin: 4.4 g/dL (ref 3.5–5.2)
Alkaline Phosphatase: 62 U/L (ref 39–117)
Bilirubin, Direct: 0.1 mg/dL (ref 0.0–0.3)
Total Bilirubin: 0.8 mg/dL (ref 0.2–1.2)
Total Protein: 6.7 g/dL (ref 6.0–8.3)

## 2021-02-25 LAB — CBC WITH DIFFERENTIAL/PLATELET
Basophils Absolute: 0.2 10*3/uL — ABNORMAL HIGH (ref 0.0–0.1)
Basophils Relative: 2.4 % (ref 0.0–3.0)
Eosinophils Absolute: 0 10*3/uL (ref 0.0–0.7)
Eosinophils Relative: 0.5 % (ref 0.0–5.0)
HCT: 41.7 % (ref 39.0–52.0)
Hemoglobin: 13.5 g/dL (ref 13.0–17.0)
Lymphocytes Relative: 20.9 % (ref 12.0–46.0)
Lymphs Abs: 1.4 10*3/uL (ref 0.7–4.0)
MCHC: 32.5 g/dL (ref 30.0–36.0)
MCV: 93.6 fl (ref 78.0–100.0)
Monocytes Absolute: 0.5 10*3/uL (ref 0.1–1.0)
Monocytes Relative: 7.8 % (ref 3.0–12.0)
Neutro Abs: 4.6 10*3/uL (ref 1.4–7.7)
Neutrophils Relative %: 68.4 % (ref 43.0–77.0)
Platelets: 221 10*3/uL (ref 150.0–400.0)
RBC: 4.46 Mil/uL (ref 4.22–5.81)
RDW: 15.2 % (ref 11.5–15.5)
WBC: 6.7 10*3/uL (ref 4.0–10.5)

## 2021-02-25 LAB — URINALYSIS, ROUTINE W REFLEX MICROSCOPIC
Bilirubin Urine: NEGATIVE
Ketones, ur: NEGATIVE
Nitrite: NEGATIVE
Specific Gravity, Urine: 1.01 (ref 1.000–1.030)
Total Protein, Urine: NEGATIVE
Urine Glucose: NEGATIVE
Urobilinogen, UA: 0.2 (ref 0.0–1.0)
pH: 6 (ref 5.0–8.0)

## 2021-02-25 LAB — LIPID PANEL
Cholesterol: 178 mg/dL (ref 0–200)
HDL: 63.2 mg/dL (ref >39.00)
LDL Cholesterol: 98 mg/dL (ref 0–99)
NonHDL: 114.52
Total CHOL/HDL Ratio: 3
Triglycerides: 85 mg/dL (ref 0.0–149.0)
VLDL: 17 mg/dL (ref 0.0–40.0)

## 2021-02-25 LAB — PSA: PSA: 3.34 ng/mL (ref 0.10–4.00)

## 2021-02-25 MED ORDER — OXYCODONE HCL 5 MG PO TABS: 5.0000 mg | ORAL_TABLET | Freq: Four times a day (QID) | ORAL | 0 refills | Status: DC | PRN

## 2021-02-25 MED ORDER — PRAVASTATIN SODIUM 10 MG PO TABS
10.0000 mg | ORAL_TABLET | Freq: Every day | ORAL | 1 refills | Status: DC
Start: 2021-02-25 — End: 2021-10-29

## 2021-02-25 NOTE — Patient Instructions (Signed)
Health Maintenance, Male Adopting a healthy lifestyle and getting preventive care are important in promoting health and wellness. Ask your health care provider about: The right schedule for you to have regular tests and exams. Things you can do on your own to prevent diseases and keep yourself healthy. What should I know about diet, weight, and exercise? Eat a healthy diet  Eat a diet that includes plenty of vegetables, fruits, low-fat dairy products, and lean protein. Do not eat a lot of foods that are high in solid fats, added sugars, or sodium. Maintain a healthy weight Body mass index (BMI) is a measurement that can be used to identify possible weight problems. It estimates body fat based on height and weight. Your health care provider can help determine your BMI and help you achieve or maintain a healthy weight. Get regular exercise Get regular exercise. This is one of the most important things you can do for your health. Most adults should: Exercise for at least 150 minutes each week. The exercise should increase your heart rate and make you sweat (moderate-intensity exercise). Do strengthening exercises at least twice a week. This is in addition to the moderate-intensity exercise. Spend less time sitting. Even light physical activity can be beneficial. Watch cholesterol and blood lipids Have your blood tested for lipids and cholesterol at 77 years of age, then have this test every 5 years. You may need to have your cholesterol levels checked more often if: Your lipid or cholesterol levels are high. You are older than 77 years of age. You are at high risk for heart disease. What should I know about cancer screening? Many types of cancers can be detected early and may often be prevented. Depending on your health history and family history, you may need to have cancer screening at various ages. This may include screening for: Colorectal cancer. Prostate cancer. Skin cancer. Lung  cancer. What should I know about heart disease, diabetes, and high blood pressure? Blood pressure and heart disease High blood pressure causes heart disease and increases the risk of stroke. This is more likely to develop in people who have high blood pressure readings, are of African descent, or are overweight. Talk with your health care provider about your target blood pressure readings. Have your blood pressure checked: Every 3-5 years if you are 18-39 years of age. Every year if you are 40 years old or older. If you are between the ages of 65 and 75 and are a current or former smoker, ask your health care provider if you should have a one-time screening for abdominal aortic aneurysm (AAA). Diabetes Have regular diabetes screenings. This checks your fasting blood sugar level. Have the screening done: Once every three years after age 45 if you are at a normal weight and have a low risk for diabetes. More often and at a younger age if you are overweight or have a high risk for diabetes. What should I know about preventing infection? Hepatitis B If you have a higher risk for hepatitis B, you should be screened for this virus. Talk with your health care provider to find out if you are at risk for hepatitis B infection. Hepatitis C Blood testing is recommended for: Everyone born from 1945 through 1965. Anyone with known risk factors for hepatitis C. Sexually transmitted infections (STIs) You should be screened each year for STIs, including gonorrhea and chlamydia, if: You are sexually active and are younger than 77 years of age. You are older than 77 years   of age and your health care provider tells you that you are at risk for this type of infection. Your sexual activity has changed since you were last screened, and you are at increased risk for chlamydia or gonorrhea. Ask your health care provider if you are at risk. Ask your health care provider about whether you are at high risk for HIV.  Your health care provider may recommend a prescription medicine to help prevent HIV infection. If you choose to take medicine to prevent HIV, you should first get tested for HIV. You should then be tested every 3 months for as long as you are taking the medicine. Follow these instructions at home: Lifestyle Do not use any products that contain nicotine or tobacco, such as cigarettes, e-cigarettes, and chewing tobacco. If you need help quitting, ask your health care provider. Do not use street drugs. Do not share needles. Ask your health care provider for help if you need support or information about quitting drugs. Alcohol use Do not drink alcohol if your health care provider tells you not to drink. If you drink alcohol: Limit how much you have to 0-2 drinks a day. Be aware of how much alcohol is in your drink. In the [place] one drink equals one 12 oz bottle of beer (355 mL), one 5 oz glass of wine (148 mL), or one 1 oz glass of hard liquor (44 mL). General instructions Schedule regular health, dental, and eye exams. Stay current with your vaccines. Tell your health care provider if: You often feel depressed. You have ever been abused or do not feel safe at home. Summary Adopting a healthy lifestyle and getting preventive care are important in promoting health and wellness. Follow your health care provider's instructions about healthy diet, exercising, and getting tested or screened for diseases. Follow your health care provider's instructions on monitoring your cholesterol and blood pressure. This information is not intended to replace advice given to you by your health care provider. Make sure you discuss any questions you have with your health care provider. Document Revised: 06/20/2020 Document Reviewed: 04/05/2018 Elsevier Patient Education  2022 [employer]  
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
## 2021-02-26 DIAGNOSIS — J441 Chronic obstructive pulmonary disease with (acute) exacerbation: Secondary | ICD-10-CM | POA: Insufficient documentation

## 2021-02-26 DIAGNOSIS — J449 Chronic obstructive pulmonary disease, unspecified: Secondary | ICD-10-CM | POA: Insufficient documentation

## 2021-02-26 MED ORDER — REVEFENACIN 175 MCG/3ML IN SOLN: 175.0000 ug | Freq: Every day | RESPIRATORY_TRACT | 1 refills | Status: DC

## 2021-02-26 MED ORDER — ARFORMOTEROL TARTRATE 15 MCG/2ML IN NEBU: 15.0000 ug | INHALATION_SOLUTION | Freq: Two times a day (BID) | RESPIRATORY_TRACT | 1 refills | Status: DC

## 2021-02-26 MED ORDER — LUBIPROSTONE 24 MCG PO CAPS: 24.0000 ug | ORAL_CAPSULE | Freq: Two times a day (BID) | ORAL | 1 refills | Status: DC

## 2021-02-26 MED ORDER — BUDESONIDE 0.25 MG/2ML IN SUSP: 0.2500 mg | Freq: Every day | RESPIRATORY_TRACT | 1 refills | Status: DC

## 2021-03-02 ENCOUNTER — Ambulatory Visit (INDEPENDENT_AMBULATORY_CARE_PROVIDER_SITE_OTHER)
Admission: RE | Admit: 2021-03-02 | Discharge: 2021-03-02 | Disposition: A | Discharge status: Home / Self Care | Payer: Medicare Other | Source: Ambulatory Visit | Attending: Internal Medicine | Admitting: Internal Medicine

## 2021-03-02 ENCOUNTER — Other Ambulatory Visit: Payer: Self-pay

## 2021-03-02 ENCOUNTER — Telehealth: Payer: Self-pay | Admitting: Internal Medicine

## 2021-03-02 DIAGNOSIS — R310 Gross hematuria: Secondary | ICD-10-CM

## 2021-03-02 NOTE — Telephone Encounter (Signed)
Patient's spouse Glenda states pharmacy will not fill rx oxyCODONE (OXY IR/ROXICODONE) 5 MG immediate release tablet due to quantity  Spouse states pharmacy is requesting providers prior auth on quantity before releasing rx  Spouse states rx:   arformoterol (BROVANA) 15 MCG/2ML NEB  revefenacin (YUPELRI) 175 MCG/3ML nebulizer solution  budesonide (PULMICORT) 0.25 MG/2ML nebulizer solution  Is to expensive and patient will continue using symbicort

## 2021-03-03 ENCOUNTER — Other Ambulatory Visit: Payer: Self-pay | Admitting: Internal Medicine

## 2021-03-03 DIAGNOSIS — R310 Gross hematuria: Secondary | ICD-10-CM

## 2021-03-03 NOTE — Telephone Encounter (Signed)
Patient spouse Holley Raring calling back in to check stauts of refill request for oxyCODONE (OXY IR/ROXICODONE) 5 MG immediate release tablet  Please call [phone]

## 2021-03-04 NOTE — Telephone Encounter (Signed)
Pharmacist stated that a PA was needed in order to fill the entire 65# Rx. Insurance will only pay for 7 day supply until PA is completed.    Key: [id number]

## 2021-03-04 NOTE — Telephone Encounter (Signed)
approved through 04/03/2021.

## 2021-03-06 ENCOUNTER — Other Ambulatory Visit: Payer: Self-pay | Admitting: Internal Medicine

## 2021-03-06 DIAGNOSIS — I10 Essential (primary) hypertension: Secondary | ICD-10-CM

## 2021-03-06 NOTE — Telephone Encounter (Signed)
Patient calling to check status of PA  Patient requesting a call back

## 2021-03-09 ENCOUNTER — Other Ambulatory Visit: Payer: Self-pay | Admitting: Internal Medicine

## 2021-03-09 DIAGNOSIS — M159 Polyosteoarthritis, unspecified: Secondary | ICD-10-CM

## 2021-03-09 MED ORDER — OXYCODONE HCL 5 MG PO TABS: 5.0000 mg | ORAL_TABLET | Freq: Four times a day (QID) | ORAL | 0 refills | Status: DC | PRN

## 2021-03-09 NOTE — Telephone Encounter (Signed)
Pt has been informed.  Pt would need a new Rx to get full amount of meds. He was only able to get 7 days worth and the remainder was voided due to PA needed.

## 2021-05-05 ENCOUNTER — Telehealth (INDEPENDENT_AMBULATORY_CARE_PROVIDER_SITE_OTHER): Payer: Medicare Other | Admitting: Family Medicine

## 2021-05-05 ENCOUNTER — Other Ambulatory Visit: Payer: Self-pay

## 2021-05-05 ENCOUNTER — Telehealth: Payer: Medicare Other | Admitting: Family Medicine

## 2021-05-05 DIAGNOSIS — U071 COVID-19: Secondary | ICD-10-CM | POA: Diagnosis not present

## 2021-05-05 MED ORDER — BENZONATATE 100 MG PO CAPS: ORAL_CAPSULE | ORAL | 0 refills | Status: DC

## 2021-05-05 MED ORDER — NIRMATRELVIR/RITONAVIR (PAXLOVID)TABLET: 3.0000 | ORAL_TABLET | Freq: Two times a day (BID) | ORAL | 0 refills | Status: AC

## 2021-05-05 NOTE — Patient Instructions (Addendum)
HOME CARE TIPS:  -COVID19 testing information: [email or web address]  Most pharmacies also offer testing and home test kits. If the Covid19 test is positive and you desire antiviral treatment, please contact a [place] or schedule a follow up virtual visit through your primary care office or through the [employer].  Other test to treat options: [email or web address]  -I sent the medication(s) we discussed to your pharmacy: Meds ordered this encounter  Medications   nirmatrelvir/ritonavir EUA (PAXLOVID) 20 x 150 MG & 10 x 100MG TABS    Sig: Take 3 tablets by mouth 2 (two) times daily for 5 days. (Take nirmatrelvir 150 mg two tablets twice daily for 5 days and ritonavir 100 mg one tablet twice daily for 5 days) Patient GFR is > 60    Dispense:  30 tablet    Refill:  0   benzonatate (TESSALON PERLES) 100 MG capsule    Sig: 1-2 capsules up to twice daily as needed for cough    Dispense:  30 capsule    Refill:  0     -I sent in the [place] treatment or referral you requested per our discussion. Please see the information provided below and discuss further with the pharmacist/treatment team.  -If taking Paxlovid, please review all medications, supplement and over the counter drugs with your pharmacist and ask them to check for any interactions. Please make the following changes to your regular medications while taking Paxlovid: *Hold you Statin, pravastatin, while taking Paxlovid and restart 3 days after you finish the Paxlovid *Do not take the Oxycodone while taking Paxlovid   -there is a chance of rebound illness after finishing your treatment. If you become sick again please isolate for an additional 5 days, plus 5 more days of masking.   -can use tylenol if needed for fevers, aches and pains per instructions  -can use nasal saline a few times per day if you have nasal congestion  -stay hydrated, drink plenty of  fluids and eat small healthy meals - avoid dairy   -follow up with your doctor in 2-3 days unless improving and feeling better  -stay home while sick, except to seek medical care. If you have COVID19, you will likely be contagious for 7-10 days. Flu or Influenza is likely contagious for about 7 days. Other respiratory viral infections remain contagious for 5-10+ days depending on the virus and many other factors. Wear a good mask that fits snugly (such as N95 or KN95) if around others to reduce the risk of transmission.  It was nice to meet you today, and I really hope you are feeling better soon. I help [facility] out with telemedicine visits on Tuesdays and Thursdays and am happy to help if you need a follow up virtual visit on those days. Otherwise, if you have any concerns or questions following this visit please schedule a follow up visit with your Primary Care doctor or seek care at a local urgent care clinic to avoid delays in care.    Seek in person care or schedule a follow up video visit promptly if your symptoms worsen, new concerns arise or you are not improving with treatment. Call 911 and/or seek emergency care if your symptoms are severe or life threatening.   FACT SHEET FOR PATIENTS, PARENTS, AND CAREGIVERS EMERGENCY USE AUTHORIZATION (EUA) OF PAXLOVID FOR CORONAVIRUS DISEASE 2019 (COVID-19) You are being given this Fact Sheet because your healthcare provider believes it is necessary to provide you with PAXLOVID for the treatment of mild-to-moderate  coronavirus disease (COVID-19) caused by the SARS-CoV-2 virus. This Fact Sheet contains information to help you understand the risks and benefits of taking the PAXLOVID you have received or may receive. The U.S. Food and Drug Administration (FDA) has issued an Emergency Use Authorization (EUA) to make PAXLOVID available during the COVID-19 pandemic (for more details about an EUA please see What is an Emergency Use  Authorization? at the end of this document). PAXLOVID is not an FDA-approved medicine in the [place]. Read this Fact Sheet for information about PAXLOVID. Talk to your healthcare provider about your options or if you have any questions. It is your choice to take PAXLOVID.  What is COVID-19? COVID-19 is caused by a virus called a coronavirus. You can get COVID-19 through close contact with another person who has the virus. COVID-19 illnesses have ranged from very mild-to-severe, including illness resulting in death. While information so far suggests that most COVID-19 illness is mild, serious illness can happen and may cause some of your other medical conditions to become worse. Older people and people of all ages with severe, long lasting (chronic) medical conditions like heart disease, lung disease, and diabetes, for example seem to be at higher risk of being hospitalized for COVID-19.  What is PAXLOVID? PAXLOVID is an investigational medicine used to treat mild-to-moderate COVID-19 in adults and children [61 years of age and older weighing at least 67 pounds (93 kg)] with positive results of direct SARS-CoV-2 viral testing, and who are at high risk for progression to severe COVID-19, including hospitalization or death. PAXLOVID is investigational because it is still being studied. There is limited information about the safety and effectiveness of using PAXLOVID to treat people with mild-to-moderate COVID-19.  The FDA has authorized the emergency use of PAXLOVID for the treatment of mild-tomoderate COVID-19 in adults and children [79 years of age and older weighing at least 46 pounds (50 kg)] with a positive test for the virus that causes COVID-19, and who are at high risk for progression to severe COVID-19, including hospitalization or death, under an EUA. 1 Revised: 11 July 2020   What should I tell my healthcare provider before I take PAXLOVID? Tell your healthcare  provider if you: ? Have any allergies ? Have liver or kidney disease ? Are pregnant or plan to become pregnant ? Are breastfeeding a child ? Have any serious illnesses  Tell your healthcare provider about all the medicines you take, including prescription and over-the-counter medicines, vitamins, and herbal supplements. Some medicines may interact with PAXLOVID and may cause serious side effects. Keep a list of your medicines to show your healthcare provider and pharmacist when you get a new medicine.  You can ask your healthcare provider or pharmacist for a list of medicines that interact with PAXLOVID. Do not start taking a new medicine without telling your healthcare provider. Your healthcare provider can tell you if it is safe to take PAXLOVID with other medicines.  Tell your healthcare provider if you are taking combined hormonal contraceptive. PAXLOVID may affect how your birth control pills work. Females who are able to become pregnant should use another effective alternative form of contraception or an additional barrier method of contraception. Talk to your healthcare provider if you have any questions about contraceptive methods that might be right for you.  How do I take PAXLOVID? ? PAXLOVID consists of 2 medicines: nirmatrelvir and ritonavir. o Take 2 pink tablets of nirmatrelvir with 1 white tablet of ritonavir by mouth 2 times each  day (in the morning and in the evening) for 5 days. For each dose, take all 3 tablets at the same time. o If you have kidney disease, talk to your healthcare provider. You may need a different dose. ? Swallow the tablets whole. Do not chew, break, or crush the tablets. ? Take PAXLOVID with or without food. ? Do not stop taking PAXLOVID without talking to your healthcare provider, even if you feel better. ? If you miss a dose of PAXLOVID within 8 hours of the time it is usually taken, take it as soon as you remember. If you miss a dose by  more than 8 hours, skip the missed dose and take the next dose at your regular time. Do not take 2 doses of PAXLOVID at the same time. ? If you take too much PAXLOVID, call your healthcare provider or go to the nearest hospital emergency room right away. ? If you are taking a ritonavir- or cobicistat-containing medicine to treat hepatitis C or Human Immunodeficiency Virus (HIV), you should continue to take your medicine as prescribed by your healthcare provider. 2 Revised: 11 July 2020    Talk to your healthcare provider if you do not feel better or if you feel worse after 5 days.  Who should generally not take PAXLOVID? Do not take PAXLOVID if: ? You are allergic to nirmatrelvir, ritonavir, or any of the ingredients in PAXLOVID. ? You are taking any of the following medicines: o Alfuzosin o Pethidine, propoxyphene o Ranolazine o Amiodarone, dronedarone, flecainide, propafenone, quinidine o Colchicine o Lurasidone, pimozide, clozapine o Dihydroergotamine, ergotamine, methylergonovine o Lovastatin, simvastatin o Sildenafil (Revatio) for pulmonary arterial hypertension (PAH) o Triazolam, oral midazolam o Apalutamide o Carbamazepine, phenobarbital, phenytoin o Rifampin o St. Johns Wort (hypericum perforatum) Taking PAXLOVID with these medicines may cause serious or life-threatening side effects or affect how PAXLOVID works.  These are not the only medicines that may cause serious side effects if taken with PAXLOVID. PAXLOVID may increase or decrease the levels of multiple other medicines. It is very important to tell your healthcare provider about all of the medicines you are taking because additional laboratory tests or changes in the dose of your other medicines may be necessary while you are taking PAXLOVID. Your healthcare provider may also tell you about specific symptoms to watch out for that may indicate that you need to stop or decrease the dose of some of your other  medicines.  What are the important possible side effects of PAXLOVID? Possible side effects of PAXLOVID are: ? Allergic Reactions. Allergic reactions can happen in people taking PAXLOVID, even after only 1 dose. Stop taking PAXLOVID and call your healthcare provider right away if you get any of the following symptoms of an allergic reaction: o hives o trouble swallowing or breathing o swelling of the mouth, lips, or face o throat tightness o hoarseness 3 Revised: 11 July 2020  o skin rash ? Liver Problems. Tell your healthcare provider right away if you have any of these signs and symptoms of liver problems: loss of appetite, yellowing of your skin and the whites of eyes (jaundice), dark-colored urine, pale colored stools and itchy skin, stomach area (abdominal) pain. ? Resistance to HIV Medicines. If you have untreated HIV infection, PAXLOVID may lead to some HIV medicines not working as well in the future. ? Other possible side effects include: o altered sense of taste o diarrhea o high blood pressure o muscle aches These are not all the possible side  effects of PAXLOVID. Not many people have taken PAXLOVID. Serious and unexpected side effects may happen. PAXLOVID is still being studied, so it is possible that all of the risks are not known at this time.  What other treatment choices are there? Veklury (remdesivir) is FDA-approved for the treatment of mild-to-moderate ZPHXT-05 in certain adults and children. Talk with your doctor to see if Marijean Heath is appropriate for you. Like PAXLOVID, FDA may also allow for the emergency use of other medicines to treat people with COVID-19. Go to https://price.info/ for information on the emergency use of other medicines that are authorized by FDA to treat people with COVID-19. Your healthcare provider may talk with you about  clinical trials for which you may be eligible. It is your choice to be treated or not to be treated with PAXLOVID. Should you decide not to receive it or for your child not to receive it, it will not change your standard medical care.  What if I am pregnant or breastfeeding? There is no experience treating pregnant women or breastfeeding mothers with PAXLOVID. For a mother and unborn baby, the benefit of taking PAXLOVID may be greater than the risk from the treatment. If you are pregnant, discuss your options and specific situation with your healthcare provider. It is recommended that you use effective barrier contraception or do not have sexual activity while taking PAXLOVID. If you are breastfeeding, discuss your options and specific situation with your healthcare provider. 4 Revised: 11 July 2020   How do I report side effects with PAXLOVID? Contact your healthcare provider if you have any side effects that bother you or do not go away. Report side effects to FDA MedWatch at SmoothHits.hu or call 1-800-FDA1088 or you can report side effects to [employer]. at the contact information provided below. Website Fax number Telephone number www.pfizersafetyreporting.com [phone] How should I store [place]? Store PAXLOVID tablets at room temperature, between 68?F to 77?F (20?C to 25?C). How can I learn more about COVID-19? ? Ask your healthcare provider. ? Visit https://jacobson-johnson.com/. ? Contact your local or state public health department. What is an Emergency Use Authorization (EUA)? The [place] FDA has made PAXLOVID available under an emergency access mechanism called an Emergency Use Authorization (EUA). The EUA is supported by a Education officer, museum and Human Service (HHS) declaration that circumstances exist to justify the emergency use of drugs and biological products during the COVID-19 pandemic. PAXLOVID for the treatment of  mild-to-moderate COVID-19 in adults and children [4 years of age and older weighing at least 42 pounds (25 kg)] with positive results of direct SARS-CoV-2 viral testing, and who are at high risk for progression to severe COVID-19, including hospitalization or death, has not undergone the same type of review as an FDA-approved product. In issuing an EUA under the JQGBE-01 public health emergency, the FDA has determined, among other things, that based on the total amount of scientific evidence available including data from adequate and well-controlled clinical trials, if available, it is reasonable to believe that the product may be effective for diagnosing, treating, or preventing COVID-19, or a serious or life-threatening disease or condition caused by COVID-19; that the known and potential benefits of the product, when used to diagnose, treat, or prevent such disease or condition, outweigh the known and potential risks of such product; and that there are no adequate, approved, and available alternatives. All of these criteria must be met to allow for the product to be used in the treatment of patients  during the COVID-19 pandemic. The EUA for PAXLOVID is in effect for the duration of the COVID-19 declaration justifying emergency use of this product, unless terminated or revoked (after which the products may no longer be used under the EUA). 5 Revised: 11 July 2020     Additional Information For general questions, visit the website or call the telephone number provided below. Website Telephone number www.COVID19oralRx.com [phone] (1-877-C19-PACK) You can also go to www.pfizermedinfo.com or call [phone] for more information. [id number] Revised: 11 July 2020
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
## 2021-06-01 ENCOUNTER — Other Ambulatory Visit: Payer: Self-pay

## 2021-06-01 ENCOUNTER — Ambulatory Visit: Payer: Medicare Other | Admitting: Internal Medicine

## 2021-06-01 ENCOUNTER — Ambulatory Visit (INDEPENDENT_AMBULATORY_CARE_PROVIDER_SITE_OTHER): Payer: Medicare Other

## 2021-06-01 ENCOUNTER — Encounter: Payer: Self-pay | Admitting: Internal Medicine

## 2021-06-01 VITALS — BP 158/96 | HR 87 | Temp 98.7°F | Wt 159.4 lb

## 2021-06-01 DIAGNOSIS — J22 Unspecified acute lower respiratory infection: Secondary | ICD-10-CM

## 2021-06-01 DIAGNOSIS — E890 Postprocedural hypothyroidism: Secondary | ICD-10-CM

## 2021-06-01 DIAGNOSIS — M150 Primary generalized (osteo)arthritis: Secondary | ICD-10-CM

## 2021-06-01 DIAGNOSIS — R058 Other specified cough: Secondary | ICD-10-CM | POA: Diagnosis not present

## 2021-06-01 DIAGNOSIS — M159 Polyosteoarthritis, unspecified: Secondary | ICD-10-CM | POA: Diagnosis not present

## 2021-06-01 LAB — TSH: TSH: 1.83 u[IU]/mL (ref 0.35–5.50)

## 2021-06-01 MED ORDER — PROMETHAZINE-DM 6.25-15 MG/5ML PO SYRP: 5.0000 mL | ORAL_SOLUTION | Freq: Four times a day (QID) | ORAL | 0 refills | Status: AC | PRN

## 2021-06-01 MED ORDER — AMOXICILLIN-POT CLAVULANATE 875-125 MG PO TABS: 1.0000 | ORAL_TABLET | Freq: Two times a day (BID) | ORAL | 0 refills | Status: AC

## 2021-06-01 MED ORDER — OXYCODONE HCL 5 MG PO TABS: 5.0000 mg | ORAL_TABLET | Freq: Four times a day (QID) | ORAL | 0 refills | Status: DC | PRN

## 2021-06-01 NOTE — Patient Instructions (Signed)

## 2021-06-01 NOTE — Progress Notes (Signed)
Subjective:  Patient ID: SOHAIL CAPRARO, male    DOB: 11/17/1943  Age: 75 y.o. MRN: [id number]  CC: Cough  This visit occurred during the SARS-CoV-2 public health emergency.  Safety protocols were in place, including screening questions prior to the visit, additional usage of staff PPE, and extensive cleaning of exam room while observing appropriate contact time as indicated for disinfecting solutions.    HPI ASHLY GOETHE presents for f/up -  He was diagnosed and treated for COVID about 3 to 4 weeks ago.  He continues to complain of a cough that is productive of thick yellow-green phlegm with chills.  Outpatient Medications Prior to Visit  Medication Sig Dispense Refill   acidophilus (RISAQUAD) CAPS capsule Take 1 capsule by mouth daily.     arformoterol (BROVANA) 15 MCG/2ML NEBU Take 2 mLs (15 mcg total) by nebulization 2 (two) times daily. 360 mL 1   Ascorbic Acid (VITAMIN C) 1000 MG tablet Take 1,000 mg by mouth daily.     aspirin 81 MG tablet Take 1 tablet (81 mg total) by mouth 2 (two) times daily at 10 AM and 5 PM. (Patient taking differently: Take 81 mg by mouth daily.) 60 tablet 0   benzonatate (TESSALON PERLES) 100 MG capsule 1-2 capsules up to twice daily as needed for cough 30 capsule 0   Brimonidine Tartrate (LUMIFY) 0.025 % SOLN Apply 1 drop to eye once. Apply to both eyes up to four times a day if needed for red eyes     budesonide (PULMICORT) 0.25 MG/2ML nebulizer solution Take 2 mLs (0.25 mg total) by nebulization daily. 180 mL 1   Calcium Carbonate-Vitamin D3 600-400 MG-UNIT TABS Take 1 tablet by mouth daily.     cloNIDine (CATAPRES - DOSED IN MG/24 HR) 0.3 mg/24hr patch APPLY 1 PATCH ONCE A WEEK 12 patch 1   Coenzyme Q10 200 MG capsule Take 30 mg by mouth daily.     Garlic 1601 MG CAPS Take 1 capsule by mouth daily.     Glucosamine 500 MG CAPS Take 2 capsules by mouth daily.     Levothyroxine Sodium 88 MCG CAPS Take by mouth.     Multiple Vitamin (MULTIVITAMIN  WITH MINERALS) TABS tablet Take 1 tablet by mouth daily.     omeprazole (PRILOSEC) 40 MG capsule Take 1 capsule (40 mg total) by mouth daily before breakfast. 30 capsule 11   pravastatin (PRAVACHOL) 10 MG tablet Take 1 tablet (10 mg total) by mouth daily. 90 tablet 1   revefenacin (YUPELRI) 175 MCG/3ML nebulizer solution Take 3 mLs (175 mcg total) by nebulization daily. 270 mL 1   oxyCODONE (OXY IR/ROXICODONE) 5 MG immediate release tablet Take 1 tablet (5 mg total) by mouth every 6 (six) hours as needed for severe pain. 65 tablet 0   No facility-administered medications prior to visit.    ROS Review of Systems  Constitutional:  Positive for chills. Negative for appetite change, diaphoresis, fatigue and fever.  HENT: Negative.  Negative for sinus pressure and sore throat.   Eyes: Negative.   Respiratory:  Positive for cough. Negative for chest tightness, shortness of breath and wheezing.   Cardiovascular:  Negative for chest pain, palpitations and leg swelling.  Gastrointestinal:  Negative for abdominal pain, diarrhea, nausea and vomiting.  Endocrine: Negative.   Genitourinary:  Negative for difficulty urinating and dysuria.  Musculoskeletal:  Positive for arthralgias. Negative for back pain, myalgias and neck pain.  Skin: Negative.   Allergic/Immunologic: Negative.  Neurological:  Negative for dizziness, weakness and light-headedness.  Hematological:  Negative for adenopathy. Does not bruise/bleed easily.  Psychiatric/Behavioral: Negative.     Objective:  BP (!) 158/96 (BP Location: Right Arm, Patient Position: Sitting, Cuff Size: Normal)    Pulse 87    Temp 98.7 F (37.1 C) (Oral)    Wt 159 lb 6 oz (72.3 kg)    SpO2 99%    BMI 24.96 kg/m   BP Readings from Last 3 Encounters:  06/01/21 (!) 158/96  02/25/21 122/76  10/10/20 (!) 156/80    Wt Readings from Last 3 Encounters:  06/01/21 159 lb 6 oz (72.3 kg)  02/25/21 156 lb (70.8 kg)  10/10/20 153 lb (69.4 kg)    Physical  Exam Vitals reviewed.  Constitutional:      Appearance: He is not ill-appearing.  HENT:     Nose: Nose normal.     Mouth/Throat:     Mouth: Mucous membranes are moist.  Eyes:     General: No scleral icterus.    Conjunctiva/sclera: Conjunctivae normal.  Cardiovascular:     Rate and Rhythm: Normal rate and regular rhythm.     Heart sounds: No murmur heard. Pulmonary:     Effort: Pulmonary effort is normal.     Breath sounds: No stridor. No wheezing, rhonchi or rales.  Chest:     Chest wall: No tenderness.  Abdominal:     General: Abdomen is flat.     Palpations: There is no mass.     Tenderness: There is no abdominal tenderness. There is no guarding or rebound.     Hernia: No hernia is present.  Musculoskeletal:        General: Normal range of motion.     Cervical back: Neck supple.     Right lower leg: No edema.     Left lower leg: No edema.  Lymphadenopathy:     Cervical: No cervical adenopathy.  Skin:    General: Skin is warm and dry.  Neurological:     General: No focal deficit present.     Mental Status: He is alert.  Psychiatric:        Mood and Affect: Mood normal.        Behavior: Behavior normal.    Lab Results  Component Value Date   WBC 6.7 02/25/2021   HGB 13.5 02/25/2021   HCT 41.7 02/25/2021   PLT 221.0 02/25/2021   GLUCOSE 85 02/25/2021   CHOL 178 02/25/2021   TRIG 85.0 02/25/2021   HDL 63.20 02/25/2021   LDLCALC 98 02/25/2021   ALT 15 02/25/2021   AST 27 02/25/2021   NA 139 02/25/2021   K 3.8 02/25/2021   CL 99 02/25/2021   CREATININE 1.01 02/25/2021   BUN 17 02/25/2021   CO2 34 (H) 02/25/2021   TSH 1.83 06/01/2021   PSA 3.34 02/25/2021   INR 1.1 11/27/2018   HGBA1C 5.1 12/15/2015    CT RENAL STONE STUDY  Result Date: 03/03/2021 CLINICAL DATA:  Hematuria. EXAM: CT ABDOMEN AND PELVIS WITHOUT CONTRAST TECHNIQUE: Multidetector CT imaging of the abdomen and pelvis was performed following the standard protocol without IV contrast.  COMPARISON:  None. FINDINGS: Lower chest: No acute abnormality. Hepatobiliary: No focal liver abnormality is seen. No gallstones, gallbladder wall thickening, or biliary dilatation. Pancreas: Unremarkable. No pancreatic ductal dilatation or surrounding inflammatory changes. Spleen: Normal in size without focal abnormality. Adrenals/Urinary Tract: Adrenal glands are unremarkable. Kidneys are normal, without renal calculi, focal lesion, or hydronephrosis.  Bladder is unremarkable. Stomach/Bowel: Stomach is within normal limits. Appendix appears normal. No evidence of bowel wall thickening, distention, or inflammatory changes. Vascular/Lymphatic: Aortic atherosclerosis. No enlarged abdominal or pelvic lymph nodes. Reproductive: Mild prostatic enlargement is noted. Other: No abdominal wall hernia or abnormality. No abdominopelvic ascites. Musculoskeletal: Status post right total hip arthroplasty. Multilevel degenerative changes are noted in the lower lumbar spine. No acute osseous abnormality is noted. IMPRESSION: No acute abnormality seen in the abdomen or pelvis. Mild prostatic enlargement. Aortic Atherosclerosis (ICD10-I70.0). Electronically Signed   By: Marijo Conception M.D.   On: 03/03/2021 11:42   No results found.   Assessment & Plan:   Wilkes was seen today for cough.  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  He will stay on the current dose of levothyroxine. -     TSH; Future -     TSH  Primary osteoarthritis involving multiple joints -     oxyCODONE (OXY IR/ROXICODONE) 5 MG immediate release tablet; Take 1 tablet (5 mg total) by mouth every 6 (six) hours as needed for severe pain.  Cough productive of purulent sputum- His chest x-ray is negative for mass or infiltrate. -     DG Chest 2 View; Future -     promethazine-dextromethorphan (PROMETHAZINE-DM) 6.25-15 MG/5ML syrup; Take 5 mLs by mouth 4 (four) times daily as needed for up  to 8 days for cough.  LRTI (lower respiratory tract infection) -     amoxicillin-clavulanate (AUGMENTIN) 875-125 MG tablet; Take 1 tablet by mouth 2 (two) times daily for 10 days. -     promethazine-dextromethorphan (PROMETHAZINE-DM) 6.25-15 MG/5ML syrup; Take 5 mLs by mouth 4 (four) times daily as needed for up to 8 days for cough.   I am having Jasaun C. Stokely start on amoxicillin-clavulanate and promethazine-dextromethorphan. I am also having him maintain his vitamin C, acidophilus, multivitamin with minerals, aspirin, Calcium Carbonate-Vitamin D3, Garlic, Coenzyme Q91, Glucosamine, Lumify, omeprazole, Levothyroxine Sodium, pravastatin, revefenacin, arformoterol, budesonide, cloNIDine, benzonatate, and oxyCODONE.  Meds ordered this encounter  Medications   oxyCODONE (OXY IR/ROXICODONE) 5 MG immediate release tablet    Sig: Take 1 tablet (5 mg total) by mouth every 6 (six) hours as needed for severe pain.    Dispense:  65 tablet    Refill:  0   amoxicillin-clavulanate (AUGMENTIN) 875-125 MG tablet    Sig: Take 1 tablet by mouth 2 (two) times daily for 10 days.    Dispense:  20 tablet    Refill:  0   promethazine-dextromethorphan (PROMETHAZINE-DM) 6.25-15 MG/5ML syrup    Sig: Take 5 mLs by mouth 4 (four) times daily as needed for up to 8 days for cough.    Dispense:  118 mL    Refill:  0     Follow-up: Return in about 4 weeks (around 06/29/2021).  Scarlette Calico, MD

## 2021-06-03 ENCOUNTER — Telehealth: Payer: Self-pay | Admitting: Internal Medicine

## 2021-06-03 NOTE — Telephone Encounter (Signed)
Pt is to return in 4 (06-29-2021) weeks for a follow up, provider does not have availability until April  Ok to schedule on 06-29-2021 in the 2:20 slot?  Please advise

## 2021-06-08 NOTE — Telephone Encounter (Signed)
OV has been scheduled for 3/6 @ 2.20pm

## 2021-06-29 ENCOUNTER — Other Ambulatory Visit: Payer: Self-pay

## 2021-06-29 ENCOUNTER — Ambulatory Visit: Payer: Medicare Other | Admitting: Internal Medicine

## 2021-06-29 ENCOUNTER — Encounter: Payer: Self-pay | Admitting: Internal Medicine

## 2021-06-29 VITALS — BP 138/86 | HR 87 | Temp 98.3°F | Resp 16 | Ht 67.0 in | Wt 160.0 lb

## 2021-06-29 DIAGNOSIS — H1013 Acute atopic conjunctivitis, bilateral: Secondary | ICD-10-CM | POA: Insufficient documentation

## 2021-06-29 DIAGNOSIS — I10 Essential (primary) hypertension: Secondary | ICD-10-CM | POA: Diagnosis not present

## 2021-06-29 MED ORDER — OLOPATADINE HCL 0.1 % OP SOLN: 1.0000 [drp] | Freq: Two times a day (BID) | OPHTHALMIC | 5 refills | Status: AC

## 2021-06-29 NOTE — Progress Notes (Signed)
? ?Subjective:  ?Patient ID: Curtis Vance, male    DOB: 11/17/1943  Age: 76 y.o. MRN: [id number] ? ?CC: Eye Problem ? ?This visit occurred during the SARS-CoV-2 public health emergency.  Safety protocols were in place, including screening questions prior to the visit, additional usage of staff PPE, and extensive cleaning of exam room while observing appropriate contact time as indicated for disinfecting solutions.   ? ?HPI ?Curtis Vance presents for f/up ? ?He complains of a several week history of red/itchy/watery eyes. ? ?Outpatient Medications Prior to Visit  ?Medication Sig Dispense Refill  ? acidophilus (RISAQUAD) CAPS capsule Take 1 capsule by mouth daily.    ? Ascorbic Acid (VITAMIN C) 1000 MG tablet Take 1,000 mg by mouth daily.    ? aspirin 81 MG tablet Take 1 tablet (81 mg total) by mouth 2 (two) times daily at 10 AM and 5 PM. (Patient taking differently: Take 81 mg by mouth daily.) 60 tablet 0  ? Brimonidine Tartrate (LUMIFY) 0.025 % SOLN Apply 1 drop to eye once. Apply to both eyes up to four times a day if needed for red eyes    ? Calcium Carbonate-Vitamin D3 600-400 MG-UNIT TABS Take 1 tablet by mouth daily.    ? cloNIDine (CATAPRES - DOSED IN MG/24 HR) 0.3 mg/24hr patch APPLY 1 PATCH ONCE A WEEK 12 patch 1  ? Coenzyme Q10 200 MG capsule Take 30 mg by mouth daily.    ? Garlic 4580 MG CAPS Take 1 capsule by mouth daily.    ? Glucosamine 500 MG CAPS Take 2 capsules by mouth daily.    ? Levothyroxine Sodium 88 MCG CAPS Take by mouth.    ? Multiple Vitamin (MULTIVITAMIN WITH MINERALS) TABS tablet Take 1 tablet by mouth daily.    ? omeprazole (PRILOSEC) 40 MG capsule Take 1 capsule (40 mg total) by mouth daily before breakfast. 30 capsule 11  ? oxyCODONE (OXY IR/ROXICODONE) 5 MG immediate release tablet Take 1 tablet (5 mg total) by mouth every 6 (six) hours as needed for severe pain. 65 tablet 0  ? pravastatin (PRAVACHOL) 10 MG tablet Take 1 tablet (10 mg total) by mouth daily. 90 tablet 1  ?  arformoterol (BROVANA) 15 MCG/2ML NEBU Take 2 mLs (15 mcg total) by nebulization 2 (two) times daily. 360 mL 1  ? benzonatate (TESSALON PERLES) 100 MG capsule 1-2 capsules up to twice daily as needed for cough 30 capsule 0  ? budesonide (PULMICORT) 0.25 MG/2ML nebulizer solution Take 2 mLs (0.25 mg total) by nebulization daily. 180 mL 1  ? revefenacin (YUPELRI) 175 MCG/3ML nebulizer solution Take 3 mLs (175 mcg total) by nebulization daily. 270 mL 1  ? ?No facility-administered medications prior to visit.  ? ? ?ROS ?Review of Systems  ?Constitutional: Negative.   ?HENT: Negative.    ?Eyes:  Positive for redness and itching. Negative for photophobia, pain, discharge and visual disturbance.  ?Respiratory:  Negative for cough and shortness of breath.   ?Cardiovascular:  Negative for chest pain, palpitations and leg swelling.  ?Gastrointestinal:  Negative for abdominal pain.  ?Genitourinary: Negative.  Negative for difficulty urinating.  ?Musculoskeletal: Negative.   ?Skin: Negative.   ?Neurological:  Negative for dizziness and weakness.  ?Hematological:  Negative for adenopathy. Does not bruise/bleed easily.  ?Psychiatric/Behavioral: Negative.    ? ?Objective:  ?BP 138/86 (BP Location: Left Arm, Patient Position: Sitting, Cuff Size: Large)   Pulse 87   Temp 98.3 ?F (36.8 ?C) (Oral)   Resp  16   Ht '5\' 7"'$  (1.702 m)   Wt 160 lb (72.6 kg)   SpO2 97%   BMI 25.06 kg/m?  ? ?BP Readings from Last 3 Encounters:  ?06/29/21 138/86  ?06/01/21 (!) 158/96  ?02/25/21 122/76  ? ? ?Wt Readings from Last 3 Encounters:  ?06/29/21 160 lb (72.6 kg)  ?06/01/21 159 lb 6 oz (72.3 kg)  ?02/25/21 156 lb (70.8 kg)  ? ? ?Physical Exam ?Vitals reviewed.  ?HENT:  ?   Nose: Nose normal.  ?   Mouth/Throat:  ?   Mouth: Mucous membranes are moist.  ?Eyes:  ?   General: No scleral icterus.    ?   Right eye: No discharge or hordeolum.     ?   Left eye: No discharge or hordeolum.  ?   Conjunctiva/sclera:  ?   Right eye: Right conjunctiva is  injected. No chemosis, exudate or hemorrhage. ?   Left eye: Left conjunctiva is injected. No chemosis, exudate or hemorrhage. ?Cardiovascular:  ?   Rate and Rhythm: Normal rate and regular rhythm.  ?   Heart sounds: No murmur heard. ?Pulmonary:  ?   Effort: Pulmonary effort is normal.  ?   Breath sounds: No stridor. No wheezing, rhonchi or rales.  ?Abdominal:  ?   General: Abdomen is flat.  ?   Palpations: There is no mass.  ?   Tenderness: There is no abdominal tenderness. There is no guarding.  ?   Hernia: No hernia is present.  ?Musculoskeletal:     ?   General: Normal range of motion.  ?   Cervical back: Neck supple.  ?   Right lower leg: No edema.  ?   Left lower leg: No edema.  ?Lymphadenopathy:  ?   Cervical: No cervical adenopathy.  ?Skin: ?   General: Skin is warm and dry.  ?   Findings: No rash.  ?Neurological:  ?   General: No focal deficit present.  ? ? ?Lab Results  ?Component Value Date  ? WBC 6.7 02/25/2021  ? HGB 13.5 02/25/2021  ? HCT 41.7 02/25/2021  ? PLT 221.0 02/25/2021  ? GLUCOSE 85 02/25/2021  ? CHOL 178 02/25/2021  ? TRIG 85.0 02/25/2021  ? HDL 63.20 02/25/2021  ? [place] 98 02/25/2021  ? ALT 15 02/25/2021  ? AST 27 02/25/2021  ? NA 139 02/25/2021  ? K 3.8 02/25/2021  ? CL 99 02/25/2021  ? CREATININE 1.01 02/25/2021  ? BUN 17 02/25/2021  ? CO2 34 (H) 02/25/2021  ? TSH 1.83 06/01/2021  ? PSA 3.34 02/25/2021  ? INR 1.1 11/27/2018  ? HGBA1C 5.1 12/15/2015  ? ? ?CT RENAL STONE STUDY ? ?Result Date: 03/03/2021 ?CLINICAL DATA:  Hematuria. EXAM: CT ABDOMEN AND PELVIS WITHOUT CONTRAST TECHNIQUE: Multidetector CT imaging of the abdomen and pelvis was performed following the standard protocol without IV contrast. COMPARISON:  None. FINDINGS: Lower chest: No acute abnormality. Hepatobiliary: No focal liver abnormality is seen. No gallstones, gallbladder wall thickening, or biliary dilatation. Pancreas: Unremarkable. No pancreatic ductal dilatation or surrounding inflammatory changes. Spleen: Normal in  size without focal abnormality. Adrenals/Urinary Tract: Adrenal glands are unremarkable. Kidneys are normal, without renal calculi, focal lesion, or hydronephrosis. Bladder is unremarkable. Stomach/Bowel: Stomach is within normal limits. Appendix appears normal. No evidence of bowel wall thickening, distention, or inflammatory changes. Vascular/Lymphatic: Aortic atherosclerosis. No enlarged abdominal or pelvic lymph nodes. Reproductive: Mild prostatic enlargement is noted. Other: No abdominal wall hernia or abnormality. No abdominopelvic ascites. Musculoskeletal: Status  post right total hip arthroplasty. Multilevel degenerative changes are noted in the lower lumbar spine. No acute osseous abnormality is noted. IMPRESSION: No acute abnormality seen in the abdomen or pelvis. Mild prostatic enlargement. Aortic Atherosclerosis (ICD10-I70.0). Electronically Signed   By: Marijo Conception M.D.   On: 03/03/2021 11:42  ? ? ?Assessment & Plan:  ? ?Nicki was seen today for eye problem. ? ?Diagnoses and all orders for this visit: ? ?Allergic conjunctivitis of both eyes ?-     olopatadine (PATANOL) 0.1 % ophthalmic solution; Place 1 drop into both eyes 2 (two) times daily. ? ?Essential hypertension- His BP is well controlled. ? ? ?I have discontinued Gurjot C. Leeder's revefenacin, arformoterol, budesonide, and benzonatate. I am also having him start on olopatadine. Additionally, I am having him maintain his vitamin C, acidophilus, multivitamin with minerals, aspirin, Calcium Carbonate-Vitamin D3, Garlic, Coenzyme X91, Glucosamine, Lumify, omeprazole, Levothyroxine Sodium, pravastatin, cloNIDine, and oxyCODONE. ? ?Meds ordered this encounter  ?Medications  ? olopatadine (PATANOL) 0.1 % ophthalmic solution  ?  Sig: Place 1 drop into both eyes 2 (two) times daily.  ?  Dispense:  5 mL  ?  Refill:  5  ? ? ? ?Follow-up: Return in about 6 months (around 12/30/2021). ? ?Scarlette Calico, MD ?

## 2021-06-29 NOTE — Patient Instructions (Signed)
Allergic Conjunctivitis, Adult ?Allergic conjunctivitis is inflammation of the conjunctiva. The conjunctiva is the thin, clear membrane that covers the white part of the eye and the inner surface of the eyelid. In this condition: ?The blood vessels in the conjunctiva become irritated and swell. ?The eyes become red or pink and feel itchy. ?Allergic conjunctivitis cannot be spread from person to person. This condition can develop at any age and may be outgrown. ?What are the causes? ?This condition is caused by allergens. These are things that can cause an allergic reaction in some people but not in other people. Common allergens include: ?Outdoor allergens, such as: ?Pollen, including pollen from grass and weeds. ?Mold spores. ?Car fumes. ?Indoor allergens, such as: ?Dust. ?Smoke. ?Mold spores. ?Proteins in a pet's urine, saliva, or dander. ?What increases the risk? ?You may be more likely to develop this condition if you have a family history of these things: ?Allergies. ?Conditions caused by being exposed to allergens, such as: ?Allergic rhinitis. This is an allergic reaction that affects the nose. ?Bronchial asthma. This condition affects the large airways in the lungs and makes breathing difficult. ?Atopic dermatitis (eczema). This is inflammation of the skin that is long-term (chronic). ?What are the signs or symptoms? ?Symptoms of this condition include eyes that are: ?Itchy. ?Red. ?Watery. ?Puffy. ?Your eyes may also: ?Sting or burn. ?Have clear fluid draining from them. ?Have thick mucus discharge and pain (vernal conjunctivitis). ?How is this diagnosed? ?This condition may be diagnosed by: ?Your medical history. ?A physical exam. ?Tests of the fluid draining from your eyes to rule out other causes. ?Other tests to confirm the diagnosis, including: ?Testing for allergies. The skin may be pricked with a tiny needle. The pricked area is then exposed to small amounts of allergens. ?Testing for other eye  conditions. Tests may include: ?Blood tests. ?Tissue scrapings from your eyelid. The tissue is then checked under a microscope. ?How is this treated? ?This condition may be treated with: ?Cold, wet cloths (cold compresses) to soothe itching and swelling. ?Washing the face to remove allergens. ?Eye drops. These may be prescription or over-the-counter. You may need to try different types to see which one works best for you, such as: ?Eye drops that block the allergic reaction (antihistamine). ?Eye drops that reduce swelling and irritation (anti-inflammatory). ?Steroid eye drops, which may be given if other treatments have not worked (vernal conjunctivitis). ?Oral antihistamine medicines. These are medicines taken by mouth to lessen your allergic reaction. You may need these if eye drops do not help or are difficult to use. ?Follow these instructions at home: ?Eye care ?Apply a clean, cold compress to your eyes for 10-20 minutes, 3-4 times a day. ?Do not touch or rub your eyes. ?Do not wear contact lenses until the inflammation is gone. Wear glasses instead. ?Do not wear eye makeup until the inflammation is gone. ?General instructions ?Avoid known allergens whenever possible. ?Take or apply over-the-counter and prescription medicines only as told by your health care provider. These include any eye drops. ?Drink enough fluid to keep your urine pale yellow. ?Keep all follow-up visits as told by your health care provider. This is important. ?Contact a health care provider if: ?Your symptoms get worse or do not get better with treatment. ?You have mild eye pain. ?You become sensitive to light. ?You have spots or blisters on your eyes. ?You have pus draining from your eyes. ?You have a fever. ?Get help right away if: ?You have redness, swelling, or other symptoms in  only one eye. ?Your vision is blurred or you have other vision changes. ?You have severe eye pain. ?Summary ?Allergic conjunctivitis is inflammation of the  clear membrane that covers the white part of the eye and the inner surface of the eyelid. ?Take or apply over-the-counter and prescription medicines only as told by your health care provider. These include eye drops. ?Do not touch or rub your eyes. ?Contact a health care provider if your symptoms get worse or do not get better with treatment. ?This information is not intended to replace advice given to you by your health care provider. Make sure you discuss any questions you have with your health care provider. ?Document Revised: 03/05/2019 Document Reviewed: 03/05/2019 ?Elsevier Patient Education ? [place]. ? ?

## 2021-08-03 ENCOUNTER — Ambulatory Visit (INDEPENDENT_AMBULATORY_CARE_PROVIDER_SITE_OTHER): Payer: Medicare Other

## 2021-08-03 DIAGNOSIS — Z0000 Encounter for general adult medical examination without abnormal findings: Secondary | ICD-10-CM | POA: Diagnosis not present
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
## 2021-08-03 NOTE — Patient Instructions (Signed)
Curtis Vance , ?Thank you for taking time to come for your Medicare Wellness Visit. I appreciate your ongoing commitment to your health goals. Please review the following plan we discussed and let me know if I can assist you in the future.  ? ?Screening recommendations/referrals: ?Colonoscopy: 10/10/2020 ?Recommended yearly ophthalmology/optometry visit for glaucoma screening and checkup ?Recommended yearly dental visit for hygiene and checkup ? ?Vaccinations: ?Influenza vaccine: 02/06/2021 ?Pneumococcal vaccine: 03/17/2015, 01/29/2019 ?Tdap vaccine: 02/12/2014 ?Shingles vaccine: never done   ?Covid-19: 05/31/2019, 06/21/2019, 04/22/2020, 02/06/2021 ? ?Advanced directives: No; Advance directive discussed with you today. Even though you declined this today please call our office should you change your mind and we can give you the proper paperwork for you to fill out. ? ? ?Conditions/risks identified: Yes ? ?Next appointment: Please schedule your next Medicare Wellness Visit with your Nurse Health Advisor in 1 year by calling [phone]. ? ?Preventive Care 11 Years and Older, Male ?Preventive care refers to lifestyle choices and visits with your health care provider that can promote health and wellness. ?What does preventive care include? ?A yearly physical exam. This is also called an annual well check. ?Dental exams once or twice a year. ?Routine eye exams. Ask your health care provider how often you should have your eyes checked. ?Personal lifestyle choices, including: ?Daily care of your teeth and gums. ?Regular physical activity. ?Eating a healthy diet. ?Avoiding tobacco and drug use. ?Limiting alcohol use. ?Practicing safe sex. ?Taking low doses of aspirin every day. ?Taking vitamin and mineral supplements as recommended by your health care provider. ?What happens during an annual well check? ?The services and screenings done by your health care provider during your annual well check will depend on your age, overall  health, lifestyle risk factors, and family history of disease. ?Counseling  ?Your health care provider may ask you questions about your: ?Alcohol use. ?Tobacco use. ?Drug use. ?Emotional well-being. ?Home and relationship well-being. ?Sexual activity. ?Eating habits. ?History of falls. ?Memory and ability to understand (cognition). ?Work and work Statistician. ?Screening  ?You may have the following tests or measurements: ?Height, weight, and BMI. ?Blood pressure. ?Lipid and cholesterol levels. These may be checked every 5 years, or more frequently if you are over 60 years old. ?Skin check. ?Lung cancer screening. You may have this screening every year starting at age 18 if you have a 30-pack-year history of smoking and currently smoke or have quit within the past 15 years. ?Fecal occult blood test (FOBT) of the stool. You may have this test every year starting at age 69. ?Flexible sigmoidoscopy or colonoscopy. You may have a sigmoidoscopy every 5 years or a colonoscopy every 10 years starting at age 34. ?Prostate cancer screening. Recommendations will vary depending on your family history and other risks. ?Hepatitis C blood test. ?Hepatitis B blood test. ?Sexually transmitted disease (STD) testing. ?Diabetes screening. This is done by checking your blood sugar (glucose) after you have not eaten for a while (fasting). You may have this done every 1-3 years. ?Abdominal aortic aneurysm (AAA) screening. You may need this if you are a current or former smoker. ?Osteoporosis. You may be screened starting at age 41 if you are at high risk. ?Talk with your health care provider about your test results, treatment options, and if necessary, the need for more tests. ?Vaccines  ?Your health care provider may recommend certain vaccines, such as: ?Influenza vaccine. This is recommended every year. ?Tetanus, diphtheria, and acellular pertussis (Tdap, Td) vaccine. You may need a Td booster  every 10 years. ?Zoster vaccine. You may  need this after age 48. ?Pneumococcal 13-valent conjugate (PCV13) vaccine. One dose is recommended after age 43. ?Pneumococcal polysaccharide (PPSV23) vaccine. One dose is recommended after age 34. ?Talk to your health care provider about which screenings and vaccines you need and how often you need them. ?This information is not intended to replace advice given to you by your health care provider. Make sure you discuss any questions you have with your health care provider. ?Document Released: 05/09/2015 Document Revised: 12/31/2015 Document Reviewed: 02/11/2015 ?Elsevier Interactive Patient Education ? 2017 [employer]. ? ?Fall Prevention in the Home ?Falls can cause injuries. They can happen to people of all ages. There are many things you can do to make your home safe and to help prevent falls. ?What can I do on the outside of my home? ?Regularly fix the edges of walkways and driveways and fix any cracks. ?Remove anything that might make you trip as you walk through a door, such as a raised step or threshold. ?Trim any bushes or trees on the path to your home. ?Use bright outdoor lighting. ?Clear any walking paths of anything that might make someone trip, such as rocks or tools. ?Regularly check to see if handrails are loose or broken. Make sure that both sides of any steps have handrails. ?Any raised decks and porches should have guardrails on the edges. ?Have any leaves, snow, or ice cleared regularly. ?Use sand or salt on walking paths during winter. ?Clean up any spills in your garage right away. This includes oil or grease spills. ?What can I do in the bathroom? ?Use night lights. ?Install grab bars by the toilet and in the tub and shower. Do not use towel bars as grab bars. ?Use non-skid mats or decals in the tub or shower. ?If you need to sit down in the shower, use a plastic, non-slip stool. ?Keep the floor dry. Clean up any water that spills on the floor as soon as it happens. ?Remove soap buildup in  the tub or shower regularly. ?Attach bath mats securely with double-sided non-slip rug tape. ?Do not have throw rugs and other things on the floor that can make you trip. ?What can I do in the bedroom? ?Use night lights. ?Make sure that you have a light by your bed that is easy to reach. ?Do not use any sheets or blankets that are too big for your bed. They should not hang down onto the floor. ?Have a firm chair that has side arms. You can use this for support while you get dressed. ?Do not have throw rugs and other things on the floor that can make you trip. ?What can I do in the kitchen? ?Clean up any spills right away. ?Avoid walking on wet floors. ?Keep items that you use a lot in easy-to-reach places. ?If you need to reach something above you, use a strong step stool that has a grab bar. ?Keep electrical cords out of the way. ?Do not use floor polish or wax that makes floors slippery. If you must use wax, use non-skid floor wax. ?Do not have throw rugs and other things on the floor that can make you trip. ?What can I do with my stairs? ?Do not leave any items on the stairs. ?Make sure that there are handrails on both sides of the stairs and use them. Fix handrails that are broken or loose. Make sure that handrails are as long as the stairways. ?Check any carpeting to  make sure that it is firmly attached to the stairs. Fix any carpet that is loose or worn. ?Avoid having throw rugs at the top or bottom of the stairs. If you do have throw rugs, attach them to the floor with carpet tape. ?Make sure that you have a light switch at the top of the stairs and the bottom of the stairs. If you do not have them, ask someone to add them for you. ?What else can I do to help prevent falls? ?Wear shoes that: ?Do not have high heels. ?Have rubber bottoms. ?Are comfortable and fit you well. ?Are closed at the toe. Do not wear sandals. ?If you use a stepladder: ?Make sure that it is fully opened. Do not climb a closed  stepladder. ?Make sure that both sides of the stepladder are locked into place. ?Ask someone to hold it for you, if possible. ?Clearly mark and make sure that you can see: ?Any grab bars or handrails. ?First and l

## 2021-09-02 ENCOUNTER — Other Ambulatory Visit: Payer: Self-pay | Admitting: Internal Medicine

## 2021-09-02 ENCOUNTER — Other Ambulatory Visit: Payer: Self-pay | Admitting: Physician Assistant

## 2021-09-09 ENCOUNTER — Other Ambulatory Visit: Payer: Self-pay | Admitting: Ophthalmology

## 2021-09-09 DIAGNOSIS — H05243 Constant exophthalmos, bilateral: Secondary | ICD-10-CM

## 2021-09-26 ENCOUNTER — Other Ambulatory Visit: Payer: Self-pay | Admitting: Internal Medicine

## 2021-09-26 DIAGNOSIS — I10 Essential (primary) hypertension: Secondary | ICD-10-CM

## 2021-10-02 ENCOUNTER — Ambulatory Visit
Admission: RE | Admit: 2021-10-02 | Discharge: 2021-10-02 | Disposition: A | Discharge status: Home / Self Care | Payer: Medicare Other | Source: Ambulatory Visit | Attending: Ophthalmology | Admitting: Ophthalmology

## 2021-10-02 DIAGNOSIS — H05243 Constant exophthalmos, bilateral: Secondary | ICD-10-CM

## 2021-10-05 ENCOUNTER — Telehealth: Payer: Self-pay

## 2021-10-05 NOTE — Telephone Encounter (Signed)
Pt wife is calling stating the the current med budesonide-formoterol (SYMBICORT) 160-4.5 MCG/ACT inhaler  is causing the pt to cough. I advise the pt wife it looks like the pt was taking Pulmicort.   Pharmacy [id number]- [place], [place] - [address] Pt is requesting to Trelegy.   Please advise

## 2021-10-09 ENCOUNTER — Other Ambulatory Visit: Payer: Self-pay | Admitting: Internal Medicine

## 2021-10-09 DIAGNOSIS — J418 Mixed simple and mucopurulent chronic bronchitis: Secondary | ICD-10-CM

## 2021-10-09 MED ORDER — TRELEGY ELLIPTA 100-62.5-25 MCG/ACT IN AEPB: 1.0000 | INHALATION_SPRAY | Freq: Every day | RESPIRATORY_TRACT | 1 refills | Status: DC

## 2021-10-09 NOTE — Telephone Encounter (Signed)
  Pt wife called in for update on Trelegy request   Please Advise

## 2021-10-29 ENCOUNTER — Other Ambulatory Visit: Payer: Self-pay | Admitting: Internal Medicine

## 2021-10-29 DIAGNOSIS — E785 Hyperlipidemia, unspecified: Secondary | ICD-10-CM

## 2021-11-04 ENCOUNTER — Other Ambulatory Visit: Payer: Self-pay | Admitting: Internal Medicine

## 2021-11-04 ENCOUNTER — Telehealth: Payer: Self-pay | Admitting: Internal Medicine

## 2021-11-04 NOTE — Telephone Encounter (Signed)
Pt called in and states he needs Flucinonide cream because the problem is still there.   Med has been discontinued since 11.2022.  Please call with questions.

## 2021-11-05 ENCOUNTER — Other Ambulatory Visit: Payer: Self-pay | Admitting: Internal Medicine

## 2021-11-05 DIAGNOSIS — L280 Lichen simplex chronicus: Secondary | ICD-10-CM

## 2021-11-05 MED ORDER — FLUOCINONIDE 0.05 % EX CREA: 1.0000 | TOPICAL_CREAM | Freq: Two times a day (BID) | CUTANEOUS | 1 refills | Status: DC

## 2021-11-30 ENCOUNTER — Ambulatory Visit: Payer: Medicare Other | Admitting: Emergency Medicine

## 2021-11-30 ENCOUNTER — Encounter: Payer: Self-pay | Admitting: Emergency Medicine

## 2021-11-30 ENCOUNTER — Ambulatory Visit (INDEPENDENT_AMBULATORY_CARE_PROVIDER_SITE_OTHER): Payer: Medicare Other

## 2021-11-30 VITALS — BP 120/66 | HR 95 | Temp 99.9°F | Ht 67.0 in | Wt 165.4 lb

## 2021-11-30 DIAGNOSIS — J441 Chronic obstructive pulmonary disease with (acute) exacerbation: Secondary | ICD-10-CM

## 2021-11-30 MED ORDER — GUAIFENESIN-CODEINE 100-10 MG/5ML PO SYRP: 5.0000 mL | ORAL_SOLUTION | Freq: Three times a day (TID) | ORAL | 1 refills | Status: DC | PRN

## 2021-11-30 MED ORDER — PREDNISONE 20 MG PO TABS: 40.0000 mg | ORAL_TABLET | Freq: Every day | ORAL | 0 refills | Status: AC

## 2021-11-30 MED ORDER — IPRATROPIUM-ALBUTEROL 0.5-2.5 (3) MG/3ML IN SOLN
3.0000 mL | Freq: Once | RESPIRATORY_TRACT | Status: AC
  Administered 2021-11-30: 3 mL via RESPIRATORY_TRACT

## 2021-11-30 MED ORDER — AZITHROMYCIN 250 MG PO TABS: ORAL_TABLET | ORAL | 0 refills | Status: DC

## 2021-11-30 NOTE — Patient Instructions (Signed)
Chronic Obstructive Pulmonary Disease  Chronic obstructive pulmonary disease (COPD) is a long-term (chronic) lung problem. When you have COPD, it is hard for air to get in and out of your lungs. Usually the condition gets worse over time, and your lungs will never return to normal. There are things you can do to keep yourself as healthy as possible. What are the causes? Smoking. This is the most common cause. Certain genes passed from parent to child (inherited). What increases the risk? Being exposed to secondhand smoke from cigarettes, pipes, or cigars. Being exposed to chemicals and other irritants, such as fumes and dust in the work environment. Having chronic lung conditions or infections. What are the signs or symptoms? Shortness of breath, especially during physical activity. A long-term cough with a large amount of thick mucus. Sometimes, the cough may not have any mucus (dry cough). Wheezing. Breathing quickly. Skin that looks gray or blue, especially in the fingers, toes, or lips. Feeling tired (fatigue). Weight loss. Chest tightness. Having infections often. Episodes when breathing symptoms become much worse (exacerbations). At the later stages of this disease, you may have swelling in the ankles, feet, or legs. How is this treated? Taking medicines. Quitting smoking, if you smoke. Rehabilitation. This includes steps to make your body work better. It may involve a team of specialists. Doing exercises. Making changes to your diet. Using oxygen. Lung surgery. Lung transplant. Comfort measures (palliative care). Follow these instructions at home: Medicines Take over-the-counter and prescription medicines only as told by your doctor. Talk to your doctor before taking any cough or allergy medicines. You may need to avoid medicines that cause your lungs to be dry. Lifestyle If you smoke, stop smoking. Smoking makes the problem worse. Do not smoke or use any products that  contain nicotine or tobacco. If you need help quitting, ask your doctor. Avoid being around things that make your breathing worse. This may include smoke, chemicals, and fumes. Stay active, but remember to rest as well. Learn and use tips on how to manage stress and control your breathing. Make sure you get enough sleep. Most adults need at least 7 hours of sleep every night. Eat healthy foods. Eat smaller meals more often. Rest before meals. Controlled breathing Learn and use tips on how to control your breathing as told by your doctor. Try: Breathing in (inhaling) through your nose for 1 second. Then, pucker your lips and breath out (exhale) through your lips for 2 seconds. Putting one hand on your belly (abdomen). Breathe in slowly through your nose for 1 second. Your hand on your belly should move out. Pucker your lips and breathe out slowly through your lips. Your hand on your belly should move in as you breathe out.  Controlled coughing Learn and use controlled coughing to clear mucus from your lungs. Follow these steps: Lean your head a little forward. Breathe in deeply. Try to hold your breath for 3 seconds. Keep your mouth slightly open while coughing 2 times. Spit any mucus out into a tissue. Rest and do the steps again 1 or 2 times as needed. General instructions Make sure you get all the shots (vaccines) that your doctor recommends. Ask your doctor about a flu shot and a pneumonia shot. Use oxygen therapy and pulmonary rehabilitation if told by your doctor. If you need home oxygen therapy, ask your doctor if you should buy a tool to measure your oxygen level (oximeter). Make a COPD action plan with your doctor. This helps you   to know what to do if you feel worse than usual. Manage any other conditions you have as told by your doctor. Avoid going outside when it is very hot, cold, or humid. Avoid people who have a sickness you can catch (contagious). Keep all follow-up  visits. Contact a doctor if: You cough up more mucus than usual. There is a change in the color or thickness of the mucus. It is harder to breathe than usual. Your breathing is faster than usual. You have trouble sleeping. You need to use your medicines more often than usual. You have trouble doing your normal activities such as getting dressed or walking around the house. Get help right away if: You have shortness of breath while resting. You have shortness of breath that stops you from: Being able to talk. Doing normal activities. Your chest hurts for longer than 5 minutes. Your skin color is more blue than usual. Your pulse oximeter shows that you have low oxygen for longer than 5 minutes. You have a fever. You feel too tired to breathe normally. These symptoms may represent a serious problem that is an emergency. Do not wait to see if the symptoms will go away. Get medical help right away. Call your local emergency services (911 in the [place] Do not drive yourself to the hospital. Summary Chronic obstructive pulmonary disease (COPD) is a long-term lung problem. The way your lungs work will never return to normal. Usually the condition gets worse over time. There are things you can do to keep yourself as healthy as possible. Take over-the-counter and prescription medicines only as told by your doctor. If you smoke, stop. Smoking makes the problem worse. This information is not intended to replace advice given to you by your health care provider. Make sure you discuss any questions you have with your health care provider. Document Revised: 02/19/2020 Document Reviewed: 02/19/2020 Elsevier Patient Education  2023 [employer] Inc.  

## 2021-11-30 NOTE — Assessment & Plan Note (Addendum)
Acute exacerbation of COPD. Cough productive of purulent phlegm. Start Zithromax daily for 5 days Chest x-ray done today DuoNeb nebulizer given today Start corticosteroids with prednisone 40 mg daily for 5 days Continue Trelegy daily ED precautions given Contact the office if no better or worse during the next couple days.
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
## 2021-11-30 NOTE — Addendum Note (Signed)
Addended by: Davina Poke on: 11/30/2021 04:24 PM   Modules accepted: Orders

## 2021-12-30 ENCOUNTER — Ambulatory Visit: Payer: Medicare Other | Admitting: Internal Medicine

## 2021-12-30 ENCOUNTER — Encounter: Payer: Self-pay | Admitting: Internal Medicine

## 2021-12-30 VITALS — BP 136/78 | HR 81 | Temp 98.1°F | Ht 67.0 in | Wt 170.0 lb

## 2021-12-30 DIAGNOSIS — B356 Tinea cruris: Secondary | ICD-10-CM

## 2021-12-30 DIAGNOSIS — E890 Postprocedural hypothyroidism: Secondary | ICD-10-CM | POA: Diagnosis not present

## 2021-12-30 DIAGNOSIS — E876 Hypokalemia: Secondary | ICD-10-CM

## 2021-12-30 DIAGNOSIS — J418 Mixed simple and mucopurulent chronic bronchitis: Secondary | ICD-10-CM | POA: Diagnosis not present

## 2021-12-30 DIAGNOSIS — J441 Chronic obstructive pulmonary disease with (acute) exacerbation: Secondary | ICD-10-CM

## 2021-12-30 DIAGNOSIS — T502X5A Adverse effect of carbonic-anhydrase inhibitors, benzothiadiazides and other diuretics, initial encounter: Secondary | ICD-10-CM

## 2021-12-30 DIAGNOSIS — I10 Essential (primary) hypertension: Secondary | ICD-10-CM | POA: Diagnosis not present

## 2021-12-30 LAB — CBC WITH DIFFERENTIAL/PLATELET
Basophils Absolute: 0.1 10*3/uL (ref 0.0–0.1)
Basophils Relative: 2.1 % (ref 0.0–3.0)
Eosinophils Absolute: 0.1 10*3/uL (ref 0.0–0.7)
Eosinophils Relative: 2.1 % (ref 0.0–5.0)
HCT: 37.8 % — ABNORMAL LOW (ref 39.0–52.0)
Hemoglobin: 12.6 g/dL — ABNORMAL LOW (ref 13.0–17.0)
Lymphocytes Relative: 26.9 % (ref 12.0–46.0)
Lymphs Abs: 1.6 10*3/uL (ref 0.7–4.0)
MCHC: 33.2 g/dL (ref 30.0–36.0)
MCV: 93.3 fl (ref 78.0–100.0)
Monocytes Absolute: 0.8 10*3/uL (ref 0.1–1.0)
Monocytes Relative: 13.5 % — ABNORMAL HIGH (ref 3.0–12.0)
Neutro Abs: 3.4 10*3/uL (ref 1.4–7.7)
Neutrophils Relative %: 55.4 % (ref 43.0–77.0)
Platelets: 186 10*3/uL (ref 150.0–400.0)
RBC: 4.05 Mil/uL — ABNORMAL LOW (ref 4.22–5.81)
RDW: 15 % (ref 11.5–15.5)
WBC: 6.1 10*3/uL (ref 4.0–10.5)

## 2021-12-30 LAB — BASIC METABOLIC PANEL
BUN: 19 mg/dL (ref 6–23)
CO2: 36 mEq/L — ABNORMAL HIGH (ref 19–32)
Calcium: 9.7 mg/dL (ref 8.4–10.5)
Chloride: 96 mEq/L (ref 96–112)
Creatinine, Ser: 1.31 mg/dL (ref 0.40–1.50)
GFR: 52.28 mL/min — ABNORMAL LOW (ref >60.00)
Glucose, Bld: 91 mg/dL (ref 70–99)
Potassium: 3.1 mEq/L — ABNORMAL LOW (ref 3.5–5.1)
Sodium: 140 mEq/L (ref 135–145)

## 2021-12-30 LAB — TSH: TSH: 5.62 u[IU]/mL — ABNORMAL HIGH (ref 0.35–5.50)

## 2021-12-30 MED ORDER — ALBUTEROL SULFATE (2.5 MG/3ML) 0.083% IN NEBU: 2.5000 mg | INHALATION_SOLUTION | Freq: Four times a day (QID) | RESPIRATORY_TRACT | 5 refills | Status: DC | PRN

## 2021-12-30 MED ORDER — CLONIDINE 0.3 MG/24HR TD PTWK: MEDICATED_PATCH | TRANSDERMAL | 1 refills | Status: DC

## 2021-12-30 MED ORDER — CICLOPIROX OLAMINE 0.77 % EX CREA: TOPICAL_CREAM | Freq: Two times a day (BID) | CUTANEOUS | 2 refills | Status: DC

## 2021-12-30 NOTE — Progress Notes (Unsigned)
Subjective:  Patient ID: Curtis Vance, male    DOB: May 16, 1945  Age: 76 y.o. MRN: [id number]  CC: No chief complaint on file.   HPI BRADFORD CAZIER presents for ***  Outpatient Medications Prior to Visit  Medication Sig Dispense Refill   acidophilus (RISAQUAD) CAPS capsule Take 1 capsule by mouth daily.     Ascorbic Acid (VITAMIN C) 1000 MG tablet Take 1,000 mg by mouth daily.     aspirin 81 MG tablet Take 1 tablet (81 mg total) by mouth 2 (two) times daily at 10 AM and 5 PM. (Patient taking differently: Take 81 mg by mouth daily.) 60 tablet 0   Brimonidine Tartrate (LUMIFY) 0.025 % SOLN Apply 1 drop to eye once. Apply to both eyes up to four times a day if needed for red eyes     Calcium Carbonate-Vitamin D3 600-400 MG-UNIT TABS Take 1 tablet by mouth daily.     Coenzyme Q10 200 MG capsule Take 30 mg by mouth daily.     fluocinonide cream (LIDEX) 2.84 % Apply 1 Application topically 2 (two) times daily. 120 g 1   Fluticasone-Umeclidin-Vilant (TRELEGY ELLIPTA) 100-62.5-25 MCG/ACT AEPB Inhale 1 puff into the lungs daily. 132 each 1   Garlic 4401 MG CAPS Take 1 capsule by mouth daily.     Glucosamine 500 MG CAPS Take 2 capsules by mouth daily.     guaiFENesin-codeine (ROBITUSSIN AC) 100-10 MG/5ML syrup Take 5 mLs by mouth 3 (three) times daily as needed for cough. 120 mL 1   levothyroxine (SYNTHROID) 75 MCG tablet Take 75 mcg by mouth daily before breakfast.     Levothyroxine Sodium 88 MCG CAPS Take by mouth.     Multiple Vitamin (MULTIVITAMIN WITH MINERALS) TABS tablet Take 1 tablet by mouth daily.     olopatadine (PATANOL) 0.1 % ophthalmic solution Place 1 drop into both eyes 2 (two) times daily. 5 mL 5   omeprazole (PRILOSEC) 40 MG capsule TAKE 1 CAPSULE BY MOUTH EVERY DAY BEFORE BREAKFAST 90 capsule 0   pravastatin (PRAVACHOL) 10 MG tablet TAKE 1 TABLET BY MOUTH EVERY DAY 90 tablet 1   azithromycin (ZITHROMAX) 250 MG tablet Sig as indicated 6 tablet 0   chlorthalidone (HYGROTON)  25 MG tablet TAKE 1 TABLET BY MOUTH EVERY DAY 90 tablet 1   cloNIDine (CATAPRES - DOSED IN MG/24 HR) 0.3 mg/24hr patch APPLY 1 PATCH ONCE A WEEK 12 patch 1   oxyCODONE (OXY IR/ROXICODONE) 5 MG immediate release tablet Take 1 tablet (5 mg total) by mouth every 6 (six) hours as needed for severe pain. 65 tablet 0   No facility-administered medications prior to visit.    ROS Review of Systems  Objective:  BP 136/78 (BP Location: Right Arm, Patient Position: Sitting, Cuff Size: Large)   Pulse 81   Temp 98.1 F (36.7 C) (Oral)   Ht '5\' 7"'$  (1.702 m)   Wt 170 lb (77.1 kg)   SpO2 96%   BMI 26.63 kg/m   BP Readings from Last 3 Encounters:  12/30/21 136/78  11/30/21 120/66  06/29/21 138/86    Wt Readings from Last 3 Encounters:  12/30/21 170 lb (77.1 kg)  11/30/21 165 lb 6 oz (75 kg)  06/29/21 160 lb (72.6 kg)    Physical Exam  Lab Results  Component Value Date   WBC 6.1 12/30/2021   HGB 12.6 (L) 12/30/2021   HCT 37.8 (L) 12/30/2021   PLT 186.0 12/30/2021   GLUCOSE 91 12/30/2021  CHOL 178 02/25/2021   TRIG 85.0 02/25/2021   HDL 63.20 02/25/2021   LDLCALC 98 02/25/2021   ALT 15 02/25/2021   AST 27 02/25/2021   NA 140 12/30/2021   K 3.1 (L) 12/30/2021   CL 96 12/30/2021   CREATININE 1.31 12/30/2021   BUN 19 12/30/2021   CO2 36 (H) 12/30/2021   TSH 5.62 (H) 12/30/2021   PSA 3.34 02/25/2021   INR 1.1 11/27/2018   HGBA1C 5.1 12/15/2015    CT ORBITS WO CONTRAST  Result Date: 10/04/2021 CLINICAL DATA:  Exophthalmos EXAM: CT ORBITS WITHOUT CONTRAST TECHNIQUE: Multidetector CT imaging of the orbits was performed using the standard protocol without intravenous contrast. Multiplanar CT image reconstructions were also generated. RADIATION DOSE REDUCTION: This exam was performed according to the departmental dose-optimization program which includes automated exposure control, adjustment of the mA and/or kV according to patient size and/or use of iterative reconstruction  technique. COMPARISON:  09/10/2020 FINDINGS: Orbits: There is progressive proptosis of the ocular globes, right greater than left. 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 There is increasing infiltration of the retro-orbital fat which may reflect edema related to venous congestion or inflammation in the setting of thyroid ophthalmopathy. The ocular lenses are absent bilaterally. The ocular globes are intact. The optic nerves are unremarkable. Visible paranasal sinuses: Clear. Soft tissues: Normal. Osseous: No acute bone abnormality. Remote left medial orbital wall fracture. Limited intracranial: No acute or significant finding. 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 Progressive proptosis of the ocular globes bilaterally. Electronically Signed   By: Fidela Salisbury M.D.   On: 10/04/2021 20:00    Assessment & Plan:   Diagnoses and all orders for this visit:  Tinea of groin -     ciclopirox (LOPROX) 0.77 % cream; Apply topically 2 (two) times daily.  Essential hypertension -     cloNIDine (CATAPRES - DOSED IN MG/24 HR) 0.3 mg/24hr patch; APPLY 1 PATCH ONCE A WEEK -     Basic metabolic panel; Future -     CBC with Differential/Platelet; Future -     CBC with Differential/Platelet -     Basic metabolic panel  Mixed simple and mucopurulent chronic bronchitis (HCC) -     albuterol (PROVENTIL) (2.5 MG/3ML) 0.083% nebulizer solution; Take 3 mLs (2.5 mg total) by nebulization every 6 (six) hours as needed for wheezing or shortness of breath.  Postablative hypothyroidism -     TSH; Future -      TSH  Diuretic-induced hypokalemia   I have discontinued Koleson C. Lipton's oxyCODONE, chlorthalidone, and azithromycin. I am also having him start on ciclopirox and albuterol. Additionally, I am having him maintain his vitamin C, acidophilus, multivitamin with minerals, aspirin, Calcium Carbonate-Vitamin D3, Garlic, Coenzyme M35, Glucosamine, Lumify, Levothyroxine Sodium, olopatadine, omeprazole, Trelegy Ellipta, pravastatin, fluocinonide cream, guaiFENesin-codeine, levothyroxine, and cloNIDine.  Meds ordered this encounter  Medications   cloNIDine (CATAPRES - DOSED IN MG/24 HR) 0.3 mg/24hr patch    Sig: APPLY 1 PATCH ONCE A WEEK    Dispense:  12 patch    Refill:  1   ciclopirox (LOPROX) 0.77 % cream    Sig: Apply topically 2 (two) times daily.  Dispense:  90 g    Refill:  2   albuterol (PROVENTIL) (2.5 MG/3ML) 0.083% nebulizer solution    Sig: Take 3 mLs (2.5 mg total) by nebulization every 6 (six) hours as needed for wheezing or shortness of breath.    Dispense:  150 mL    Refill:  5     Follow-up: Return in about 3 months (around 03/31/2022).  Scarlette Calico, MD

## 2021-12-30 NOTE — Patient Instructions (Signed)
Jock Itch Jock itch (tinea cruris) is an infection of the skin in the groin area that is caused by a fungus. Jock itch causes an itchy rash in the groin and upper thigh area. It usually goes away in 2-3 weeks with treatment. What are the causes? The fungus that causes jock itch may be spread by: Touching a fungal infection elsewhere on your body, such as athlete's foot, and then touching your groin area. Sharing towels or clothing, such as socks or shoes, with someone who has a fungal infection. What increases the risk? Jock itch is most common in men and adolescent boys. You are also more likely to develop the condition if you: Are in a hot, humid climate. Wear tight-fitting clothing or wet bathing suits for long periods of time. Play sports. Are overweight. Have diabetes. Have a weakened body defense system (immune system). Sweat a lot. What are the signs or symptoms? Symptoms of jock itch may include: A red, pink, or brown rash in the groin area. Blisters may be present. The rash may spread to the thighs, the opening between the buttocks (anus), and the buttocks. Dry and scaly skin on or around the rash. Itchiness. How is this diagnosed? In most cases, your health care provider can make the diagnosis by looking at your rash. In some cases, a sample of infected skin may be scraped off. This sample may be examined under a microscope (biopsy) or by trying to grow the fungus from the sample (culture). How is this treated? Treatment for this condition may include: Antifungal medicine to kill the fungus. This may be a skin cream, ointment, or powder, or it may be a medicine that you take by mouth (orally). Skin cream or ointment to reduce itching. Lifestyle changes, such as wearing looser clothing and caring for your skin. Follow these instructions at home: Skin care Apply skin creams, ointments, or powders exactly as told by your health care provider. Wear loose-fitting clothing that does  not rub against your groin area. Men should wear boxer shorts or loose-fitting underwear. Keep your groin area clean and dry. Change your underwear every day. Change out of wet bathing suits as soon as possible. After bathing, use a separate towel to gently dry your groin area thoroughly. Using a separate towel will help prevent spreading the infection to other parts of your body. Avoid hot baths and showers. Hot water can make itching worse. Do not scratch the affected area. General instructions Take and apply over-the-counter and prescription medicines only as told by your health care provider. Do not share towels, clothing, or personal items with other people. Wash your hands often with soap and water for at least 20 seconds, especially after touching your groin area. If soap and water are not available, use hand sanitizer. When at the gym: Always wear shoes, especially in the shower and around the swimming pool. Keep any cuts covered. Disinfect any mats or equipment before using them. Shower immediately after working out. Keep all follow-up visits. This is important. Contact a health care provider if: Your rash: Gets worse or does not get better after 2 weeks of treatment. Spreads. Returns after treatment is finished. You have any of the following: A fever. New or worsening redness, swelling, or pain around your rash. Fluid, blood, or pus coming from your rash. Summary Jock itch (tinea cruris) is a fungal infection of the skin in the groin area. The fungus can be spread by sharing clothing or by touching a fungus infection   elsewhere on your body and then touching your groin area. Treatment may include antifungal medicine and lifestyle changes, such as keeping the area clean and dry. This information is not intended to replace advice given to you by your health care provider. Make sure you discuss any questions you have with your health care provider. Document Revised: 07/01/2020  Document Reviewed: 07/01/2020 Elsevier Patient Education  2023 [employer]  

## 2021-12-31 DIAGNOSIS — E876 Hypokalemia: Secondary | ICD-10-CM | POA: Insufficient documentation

## 2021-12-31 DIAGNOSIS — J418 Mixed simple and mucopurulent chronic bronchitis: Secondary | ICD-10-CM | POA: Insufficient documentation

## 2021-12-31 MED ORDER — POTASSIUM CHLORIDE CRYS ER 15 MEQ PO TBCR: 15.0000 meq | EXTENDED_RELEASE_TABLET | Freq: Two times a day (BID) | ORAL | 0 refills | Status: DC

## 2022-02-24 ENCOUNTER — Other Ambulatory Visit: Payer: Self-pay | Admitting: Internal Medicine

## 2022-02-24 DIAGNOSIS — J418 Mixed simple and mucopurulent chronic bronchitis: Secondary | ICD-10-CM

## 2022-03-25 ENCOUNTER — Other Ambulatory Visit: Payer: Self-pay | Admitting: Internal Medicine

## 2022-03-25 DIAGNOSIS — T502X5A Adverse effect of carbonic-anhydrase inhibitors, benzothiadiazides and other diuretics, initial encounter: Secondary | ICD-10-CM

## 2022-03-25 DIAGNOSIS — I10 Essential (primary) hypertension: Secondary | ICD-10-CM

## 2022-03-31 ENCOUNTER — Encounter: Payer: Self-pay | Admitting: Internal Medicine

## 2022-03-31 ENCOUNTER — Ambulatory Visit: Payer: Medicare Other | Admitting: Internal Medicine

## 2022-03-31 VITALS — BP 138/80 | HR 83 | Temp 98.0°F | Ht 67.0 in | Wt 175.0 lb

## 2022-03-31 DIAGNOSIS — D539 Nutritional anemia, unspecified: Secondary | ICD-10-CM | POA: Insufficient documentation

## 2022-03-31 DIAGNOSIS — B356 Tinea cruris: Secondary | ICD-10-CM

## 2022-03-31 DIAGNOSIS — E876 Hypokalemia: Secondary | ICD-10-CM | POA: Diagnosis not present

## 2022-03-31 DIAGNOSIS — T502X5A Adverse effect of carbonic-anhydrase inhibitors, benzothiadiazides and other diuretics, initial encounter: Secondary | ICD-10-CM

## 2022-03-31 DIAGNOSIS — E890 Postprocedural hypothyroidism: Secondary | ICD-10-CM

## 2022-03-31 DIAGNOSIS — E785 Hyperlipidemia, unspecified: Secondary | ICD-10-CM

## 2022-03-31 DIAGNOSIS — I10 Essential (primary) hypertension: Secondary | ICD-10-CM | POA: Diagnosis not present

## 2022-03-31 DIAGNOSIS — Z0001 Encounter for general adult medical examination with abnormal findings: Secondary | ICD-10-CM

## 2022-03-31 LAB — CBC WITH DIFFERENTIAL/PLATELET
Basophils Absolute: 0.1 10*3/uL (ref 0.0–0.1)
Basophils Relative: 1.6 % (ref 0.0–3.0)
Eosinophils Absolute: 0.1 10*3/uL (ref 0.0–0.7)
Eosinophils Relative: 1 % (ref 0.0–5.0)
HCT: 39.5 % (ref 39.0–52.0)
Hemoglobin: 13.2 g/dL (ref 13.0–17.0)
Lymphocytes Relative: 28.5 % (ref 12.0–46.0)
Lymphs Abs: 1.9 10*3/uL (ref 0.7–4.0)
MCHC: 33.3 g/dL (ref 30.0–36.0)
MCV: 92.1 fl (ref 78.0–100.0)
Monocytes Absolute: 0.6 10*3/uL (ref 0.1–1.0)
Monocytes Relative: 9.4 % (ref 3.0–12.0)
Neutro Abs: 4 10*3/uL (ref 1.4–7.7)
Neutrophils Relative %: 59.5 % (ref 43.0–77.0)
Platelets: 239 10*3/uL (ref 150.0–400.0)
RBC: 4.3 Mil/uL (ref 4.22–5.81)
RDW: 14.2 % (ref 11.5–15.5)
WBC: 6.7 10*3/uL (ref 4.0–10.5)

## 2022-03-31 LAB — BASIC METABOLIC PANEL
BUN: 17 mg/dL (ref 6–23)
CO2: 33 mEq/L — ABNORMAL HIGH (ref 19–32)
Calcium: 9.9 mg/dL (ref 8.4–10.5)
Chloride: 99 mEq/L (ref 96–112)
Creatinine, Ser: 1.07 mg/dL (ref 0.40–1.50)
GFR: 66.53 mL/min (ref >60.00)
Glucose, Bld: 92 mg/dL (ref 70–99)
Potassium: 3.4 mEq/L — ABNORMAL LOW (ref 3.5–5.1)
Sodium: 138 mEq/L (ref 135–145)

## 2022-03-31 LAB — LIPID PANEL
Cholesterol: 186 mg/dL (ref 0–200)
HDL: 49.3 mg/dL (ref >39.00)
LDL Cholesterol: 105 mg/dL — ABNORMAL HIGH (ref 0–99)
NonHDL: 136.82
Total CHOL/HDL Ratio: 4
Triglycerides: 160 mg/dL — ABNORMAL HIGH (ref 0.0–149.0)
VLDL: 32 mg/dL (ref 0.0–40.0)

## 2022-03-31 LAB — HEPATIC FUNCTION PANEL
ALT: 23 U/L (ref 0–53)
AST: 39 U/L — ABNORMAL HIGH (ref 0–37)
Albumin: 4.5 g/dL (ref 3.5–5.2)
Alkaline Phosphatase: 57 U/L (ref 39–117)
Bilirubin, Direct: 0.1 mg/dL (ref 0.0–0.3)
Total Bilirubin: 0.7 mg/dL (ref 0.2–1.2)
Total Protein: 7.4 g/dL (ref 6.0–8.3)

## 2022-03-31 LAB — TSH: TSH: 5.04 u[IU]/mL (ref 0.35–5.50)

## 2022-03-31 LAB — IBC + FERRITIN
Ferritin: 35.8 ng/mL (ref 22.0–322.0)
Iron: 89 ug/dL (ref 42–165)
Saturation Ratios: 28.6 % (ref 20.0–50.0)
TIBC: 310.8 ug/dL (ref 250.0–450.0)
Transferrin: 222 mg/dL (ref 212.0–360.0)

## 2022-03-31 LAB — CK: Total CK: 600 U/L — ABNORMAL HIGH (ref 7–232)

## 2022-03-31 LAB — VITAMIN B12: Vitamin B-12: 1104 pg/mL — ABNORMAL HIGH (ref 211–911)

## 2022-03-31 LAB — FOLATE: Folate: >23.8 ng/mL (ref >5.9)

## 2022-03-31 MED ORDER — TERBINAFINE HCL 250 MG PO TABS: 250.0000 mg | ORAL_TABLET | Freq: Every day | ORAL | 0 refills | Status: AC

## 2022-03-31 NOTE — Progress Notes (Signed)
Subjective:  Patient ID: Curtis Vance, male    DOB: 02/04/1946  Age: 76 y.o. MRN: [id number]  CC: Rash, Hypertension, Annual Exam, and Anemia   HPI Curtis Vance presents for a CPX and f/up -  He complains of a persistent red/itchy rash in his groin despite using 2 topical agents.  Outpatient Medications Prior to Visit  Medication Sig Dispense Refill   acidophilus (RISAQUAD) CAPS capsule Take 1 capsule by mouth daily.     albuterol (PROVENTIL) (2.5 MG/3ML) 0.083% nebulizer solution Take 3 mLs (2.5 mg total) by nebulization every 6 (six) hours as needed for wheezing or shortness of breath. 150 mL 5   aspirin 81 MG tablet Take 1 tablet (81 mg total) by mouth 2 (two) times daily at 10 AM and 5 PM. (Patient taking differently: Take 81 mg by mouth daily.) 60 tablet 0   Brimonidine Tartrate (LUMIFY) 0.025 % SOLN Apply 1 drop to eye once. Apply to both eyes up to four times a day if needed for red eyes     Calcium Carbonate-Vitamin D3 600-400 MG-UNIT TABS Take 1 tablet by mouth daily.     cloNIDine (CATAPRES - DOSED IN MG/24 HR) 0.3 mg/24hr patch APPLY 1 PATCH ONCE A WEEK 12 patch 1   Coenzyme Q10 200 MG capsule Take 30 mg by mouth daily.     Garlic 8099 MG CAPS Take 1 capsule by mouth daily.     Glucosamine 500 MG CAPS Take 2 capsules by mouth daily.     levothyroxine (SYNTHROID) 75 MCG tablet Take 75 mcg by mouth daily before breakfast.     Levothyroxine Sodium 88 MCG CAPS Take by mouth.     Multiple Vitamin (MULTIVITAMIN WITH MINERALS) TABS tablet Take 1 tablet by mouth daily.     olopatadine (PATANOL) 0.1 % ophthalmic solution Place 1 drop into both eyes 2 (two) times daily. 5 mL 5   omeprazole (PRILOSEC) 40 MG capsule TAKE 1 CAPSULE BY MOUTH EVERY DAY BEFORE BREAKFAST 90 capsule 0   Potassium Chloride ER (KLOR-CON M15) 15 MEQ TBCR TAKE 1 TABLET (15 MEQ TOTAL) BY MOUTH 2 (TWO) TIMES DAILY. 180 each 0   pravastatin (PRAVACHOL) 10 MG tablet TAKE 1 TABLET BY MOUTH EVERY DAY 90 tablet 1    TRELEGY ELLIPTA 100-62.5-25 MCG/ACT AEPB TAKE 1 PUFF BY MOUTH EVERY DAY 120 each 1   Ascorbic Acid (VITAMIN C) 1000 MG tablet Take 1,000 mg by mouth daily.     ciclopirox (LOPROX) 0.77 % cream Apply topically 2 (two) times daily. 90 g 2   fluocinonide cream (LIDEX) 8.33 % Apply 1 Application topically 2 (two) times daily. 120 g 1   guaiFENesin-codeine (ROBITUSSIN AC) 100-10 MG/5ML syrup Take 5 mLs by mouth 3 (three) times daily as needed for cough. 120 mL 1   No facility-administered medications prior to visit.    ROS Review of Systems  Objective:  BP 138/80 (BP Location: Right Arm, Patient Position: Sitting, Cuff Size: Large)   Pulse 83   Temp 98 F (36.7 C) (Oral)   Ht '5\' 7"'$  (1.702 m)   Wt 175 lb (79.4 kg)   SpO2 94%   BMI 27.41 kg/m   BP Readings from Last 3 Encounters:  03/31/22 138/80  12/30/21 136/78  11/30/21 120/66    Wt Readings from Last 3 Encounters:  03/31/22 175 lb (79.4 kg)  12/30/21 170 lb (77.1 kg)  11/30/21 165 lb 6 oz (75 kg)    Physical Exam Vitals reviewed.  Constitutional:      General: He is not in acute distress.    Appearance: He is not ill-appearing, toxic-appearing or diaphoretic.  HENT:     Mouth/Throat:     Mouth: Mucous membranes are moist.  Eyes:     General: No scleral icterus.    Conjunctiva/sclera: Conjunctivae normal.  Cardiovascular:     Rate and Rhythm: Normal rate and regular rhythm.     Heart sounds: No murmur heard. Pulmonary:     Effort: Pulmonary effort is normal.     Breath sounds: Examination of the right-lower field reveals wheezing and rales. Examination of the left-lower field reveals rales. Wheezing and rales present. No decreased breath sounds or rhonchi.  Abdominal:     General: Abdomen is flat.     Palpations: There is no mass.     Tenderness: There is no abdominal tenderness. There is no guarding or rebound.     Hernia: No hernia is present.  Musculoskeletal:        General: Normal range of motion.      Cervical back: Neck supple.     Right lower leg: No edema.     Left lower leg: No edema.  Lymphadenopathy:     Cervical: No cervical adenopathy.  Skin:    General: Skin is warm and dry.     Findings: Rash present.     Comments: There is diffuse erythema in the bilateral intertriginous areas with no involvement of the scrotum  Neurological:     General: No focal deficit present.     Mental Status: He is alert. Mental status is at baseline.  Psychiatric:        Mood and Affect: Mood normal.        Behavior: Behavior normal.     Lab Results  Component Value Date   WBC 6.7 03/31/2022   HGB 13.2 03/31/2022   HCT 39.5 03/31/2022   PLT 239.0 03/31/2022   GLUCOSE 92 03/31/2022   CHOL 186 03/31/2022   TRIG 160.0 (H) 03/31/2022   HDL 49.30 03/31/2022   LDLCALC 105 (H) 03/31/2022   ALT 23 03/31/2022   AST 39 (H) 03/31/2022   NA 138 03/31/2022   K 3.4 (L) 03/31/2022   CL 99 03/31/2022   CREATININE 1.07 03/31/2022   BUN 17 03/31/2022   CO2 33 (H) 03/31/2022   TSH 5.04 03/31/2022   PSA 3.34 02/25/2021   INR 1.1 11/27/2018   HGBA1C 5.1 12/15/2015    CT ORBITS WO CONTRAST  Result Date: 10/04/2021 CLINICAL DATA:  Exophthalmos EXAM: CT ORBITS WITHOUT CONTRAST TECHNIQUE: Multidetector CT imaging of the orbits was performed using the standard protocol without intravenous contrast. Multiplanar CT image reconstructions were also generated. RADIATION DOSE REDUCTION: This exam was performed according to the departmental dose-optimization program which includes automated exposure control, adjustment of the mA and/or kV according to patient size and/or use of iterative reconstruction technique. COMPARISON:  09/10/2020 FINDINGS: Orbits: There is progressive proptosis of the ocular globes, right greater than left. 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 There is increasing infiltration of the retro-orbital fat which may reflect edema related to venous congestion or inflammation in the setting of thyroid ophthalmopathy. The ocular lenses are absent bilaterally. The ocular globes are intact. The optic nerves are unremarkable. Visible paranasal sinuses: Clear. Soft  tissues: Normal. Osseous: No acute bone abnormality. Remote left medial orbital wall fracture. Limited intracranial: No acute or significant finding. 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 Progressive proptosis of the ocular globes bilaterally. Electronically Signed   By: Fidela Salisbury M.D.   On: 10/04/2021 20:00    Assessment & Plan:   Oluwadarasimi was seen today for rash, hypertension, annual exam and anemia.  Diagnoses and all orders for this visit:  Essential hypertension- His BP is well controlled -     Basic metabolic panel; Future -     Basic metabolic panel  Diuretic-induced hypokalemia -     Basic metabolic panel; Future -     Basic metabolic panel  Tinea of groin -     terbinafine (LAMISIL) 250 MG tablet; Take 1 tablet (250 mg total) by mouth daily for 14 days.  Encounter for general adult medical examination with abnormal findings- Exam completed, labs reviewed, vaccines are UTD, no cancer screenings indicated, pt ed material was give.  Deficiency anemia- H/H are normal. Will evaluate for vitamin deficiencies. -     IBC + Ferritin; Future -     Reticulocytes; Future -     Vitamin B1; Future -     Zinc; Future -     Folate; Future -     CBC with Differential/Platelet; Future -     Vitamin B12; Future -     IBC + Ferritin -     Reticulocytes -     Vitamin B1 -     Zinc -     Folate -     CBC with  Differential/Platelet -     Vitamin B12  Postablative hypothyroidism- He is euthyroid. -     TSH; Future -     TSH  Hyperlipidemia with target LDL less than 130- LDL goal achieved. Doing well on the statin  -     Lipid panel; Future -     Hepatic function panel; Future -     CK; Future -     Lipid panel -     Hepatic function panel -     CK   I have discontinued [place] C. Emond's vitamin C, fluocinonide cream, guaiFENesin-codeine, and ciclopirox. I am also having him start on terbinafine. Additionally, I am having him maintain his acidophilus, multivitamin with minerals, aspirin, Calcium Carbonate-Vitamin D3, Garlic, Coenzyme H37, Glucosamine, Lumify, Levothyroxine Sodium, olopatadine, omeprazole, pravastatin, levothyroxine, cloNIDine, albuterol, Trelegy Ellipta, and Klor-Con M15.  Meds ordered this encounter  Medications   terbinafine (LAMISIL) 250 MG tablet    Sig: Take 1 tablet (250 mg total) by mouth daily for 14 days.    Dispense:  14 tablet    Refill:  0     Follow-up: Return in about 3 months (around 06/30/2022).  Scarlette Calico, MD

## 2022-03-31 NOTE — Patient Instructions (Signed)
Health Maintenance, Male Adopting a healthy lifestyle and getting preventive care are important in promoting health and wellness. Ask your health care provider about: The right schedule for you to have regular tests and exams. Things you can do on your own to prevent diseases and keep yourself healthy. What should I know about diet, weight, and exercise? Eat a healthy diet  Eat a diet that includes plenty of vegetables, fruits, low-fat dairy products, and lean protein. Do not eat a lot of foods that are high in solid fats, added sugars, or sodium. Maintain a healthy weight Body mass index (BMI) is a measurement that can be used to identify possible weight problems. It estimates body fat based on height and weight. Your health care provider can help determine your BMI and help you achieve or maintain a healthy weight. Get regular exercise Get regular exercise. This is one of the most important things you can do for your health. Most adults should: Exercise for at least 150 minutes each week. The exercise should increase your heart rate and make you sweat (moderate-intensity exercise). Do strengthening exercises at least twice a week. This is in addition to the moderate-intensity exercise. Spend less time sitting. Even light physical activity can be beneficial. Watch cholesterol and blood lipids Have your blood tested for lipids and cholesterol at 78 years of age, then have this test every 5 years. You may need to have your cholesterol levels checked more often if: Your lipid or cholesterol levels are high. You are older than 78 years of age. You are at high risk for heart disease. What should I know about cancer screening? Many types of cancers can be detected early and may often be prevented. Depending on your health history and family history, you may need to have cancer screening at various ages. This may include screening for: Colorectal cancer. Prostate cancer. Skin cancer. Lung  cancer. What should I know about heart disease, diabetes, and high blood pressure? Blood pressure and heart disease High blood pressure causes heart disease and increases the risk of stroke. This is more likely to develop in people who have high blood pressure readings or are overweight. Talk with your health care provider about your target blood pressure readings. Have your blood pressure checked: Every 3-5 years if you are 18-39 years of age. Every year if you are 40 years old or older. If you are between the ages of 65 and 75 and are a current or former smoker, ask your health care provider if you should have a one-time screening for abdominal aortic aneurysm (AAA). Diabetes Have regular diabetes screenings. This checks your fasting blood sugar level. Have the screening done: Once every three years after age 45 if you are at a normal weight and have a low risk for diabetes. More often and at a younger age if you are overweight or have a high risk for diabetes. What should I know about preventing infection? Hepatitis B If you have a higher risk for hepatitis B, you should be screened for this virus. Talk with your health care provider to find out if you are at risk for hepatitis B infection. Hepatitis C Blood testing is recommended for: Everyone born from 1945 through 1965. Anyone with known risk factors for hepatitis C. Sexually transmitted infections (STIs) You should be screened each year for STIs, including gonorrhea and chlamydia, if: You are sexually active and are younger than 78 years of age. You are older than 78 years of age and your   health care provider tells you that you are at risk for this type of infection. Your sexual activity has changed since you were last screened, and you are at increased risk for chlamydia or gonorrhea. Ask your health care provider if you are at risk. Ask your health care provider about whether you are at high risk for HIV. Your health care provider  may recommend a prescription medicine to help prevent HIV infection. If you choose to take medicine to prevent HIV, you should first get tested for HIV. You should then be tested every 3 months for as long as you are taking the medicine. Follow these instructions at home: Alcohol use Do not drink alcohol if your health care provider tells you not to drink. If you drink alcohol: Limit how much you have to 0-2 drinks a day. Know how much alcohol is in your drink. In the [place] one drink equals one 12 oz bottle of beer (355 mL), one 5 oz glass of wine (148 mL), or one 1 oz glass of hard liquor (44 mL). Lifestyle Do not use any products that contain nicotine or tobacco. These products include cigarettes, chewing tobacco, and vaping devices, such as e-cigarettes. If you need help quitting, ask your health care provider. Do not use street drugs. Do not share needles. Ask your health care provider for help if you need support or information about quitting drugs. General instructions Schedule regular health, dental, and eye exams. Stay current with your vaccines. Tell your health care provider if: You often feel depressed. You have ever been abused or do not feel safe at home. Summary Adopting a healthy lifestyle and getting preventive care are important in promoting health and wellness. Follow your health care provider's instructions about healthy diet, exercising, and getting tested or screened for diseases. Follow your health care provider's instructions on monitoring your cholesterol and blood pressure. This information is not intended to replace advice given to you by your health care provider. Make sure you discuss any questions you have with your health care provider. Document Revised: 09/01/2020 Document Reviewed: 09/01/2020 Elsevier Patient Education  2023 Elsevier Inc.  

## 2022-04-05 LAB — RETICULOCYTES
ABS Retic: 54860 cells/uL (ref 25000–90000)
Retic Ct Pct: 1.3 %

## 2022-04-05 LAB — VITAMIN B1: Vitamin B1 (Thiamine): 37 nmol/L — ABNORMAL HIGH (ref 8–30)

## 2022-04-05 LAB — ZINC: Zinc: 62 ug/dL (ref 60–130)

## 2022-04-25 ENCOUNTER — Other Ambulatory Visit: Payer: Self-pay | Admitting: Internal Medicine

## 2022-04-25 DIAGNOSIS — E785 Hyperlipidemia, unspecified: Secondary | ICD-10-CM

## 2022-04-30 ENCOUNTER — Other Ambulatory Visit: Payer: Self-pay | Admitting: Internal Medicine

## 2022-04-30 DIAGNOSIS — J418 Mixed simple and mucopurulent chronic bronchitis: Secondary | ICD-10-CM

## 2022-05-04 ENCOUNTER — Other Ambulatory Visit: Payer: Self-pay | Admitting: Rehabilitation

## 2022-05-04 DIAGNOSIS — M4322 Fusion of spine, cervical region: Secondary | ICD-10-CM

## 2022-05-04 DIAGNOSIS — M5412 Radiculopathy, cervical region: Secondary | ICD-10-CM

## 2022-05-28 ENCOUNTER — Ambulatory Visit
Admission: RE | Admit: 2022-05-28 | Discharge: 2022-05-28 | Disposition: A | Discharge status: Home / Self Care | Payer: Medicare Other | Source: Ambulatory Visit | Attending: Rehabilitation | Admitting: Rehabilitation

## 2022-05-28 DIAGNOSIS — M4322 Fusion of spine, cervical region: Secondary | ICD-10-CM

## 2022-05-28 DIAGNOSIS — M5412 Radiculopathy, cervical region: Secondary | ICD-10-CM

## 2022-06-20 ENCOUNTER — Other Ambulatory Visit: Payer: Self-pay | Admitting: Internal Medicine

## 2022-06-20 DIAGNOSIS — I10 Essential (primary) hypertension: Secondary | ICD-10-CM

## 2022-06-28 ENCOUNTER — Other Ambulatory Visit: Payer: Self-pay | Admitting: Internal Medicine

## 2022-06-28 ENCOUNTER — Telehealth: Payer: Self-pay | Admitting: Internal Medicine

## 2022-06-28 DIAGNOSIS — I10 Essential (primary) hypertension: Secondary | ICD-10-CM

## 2022-06-28 MED ORDER — CHLORTHALIDONE 15 MG PO TABS: 15.0000 mg | ORAL_TABLET | Freq: Every day | ORAL | 0 refills | Status: DC

## 2022-06-28 NOTE — Telephone Encounter (Signed)
Pt spouse called in requesting to get Rx chlorthalidone refilled. I notice this medication wasn't on the list and wife stated he is still taking the medication. Please give pt a call back with advice.  Upcoming appt 06/30/2022

## 2022-06-30 ENCOUNTER — Encounter: Payer: Self-pay | Admitting: Internal Medicine

## 2022-06-30 ENCOUNTER — Ambulatory Visit: Payer: Medicare Other | Admitting: Internal Medicine

## 2022-06-30 VITALS — BP 140/88 | HR 71 | Temp 97.8°F | Ht 67.0 in | Wt 179.0 lb

## 2022-06-30 DIAGNOSIS — L280 Lichen simplex chronicus: Secondary | ICD-10-CM | POA: Diagnosis not present

## 2022-06-30 DIAGNOSIS — M7021 Olecranon bursitis, right elbow: Secondary | ICD-10-CM

## 2022-06-30 DIAGNOSIS — E876 Hypokalemia: Secondary | ICD-10-CM | POA: Insufficient documentation

## 2022-06-30 DIAGNOSIS — B356 Tinea cruris: Secondary | ICD-10-CM | POA: Diagnosis not present

## 2022-06-30 DIAGNOSIS — I10 Essential (primary) hypertension: Secondary | ICD-10-CM

## 2022-06-30 LAB — BASIC METABOLIC PANEL
BUN: 19 mg/dL (ref 6–23)
CO2: 30 mEq/L (ref 19–32)
Calcium: 10.1 mg/dL (ref 8.4–10.5)
Chloride: 99 mEq/L (ref 96–112)
Creatinine, Ser: 1.03 mg/dL (ref 0.40–1.50)
GFR: 69.52 mL/min (ref >60.00)
Glucose, Bld: 87 mg/dL (ref 70–99)
Potassium: 3.7 mEq/L (ref 3.5–5.1)
Sodium: 138 mEq/L (ref 135–145)

## 2022-06-30 LAB — MAGNESIUM: Magnesium: 1.7 mg/dL (ref 1.5–2.5)

## 2022-06-30 MED ORDER — TRIAMCINOLONE ACETONIDE 0.5 % EX CREA: 1.0000 | TOPICAL_CREAM | Freq: Three times a day (TID) | CUTANEOUS | 3 refills | Status: AC

## 2022-06-30 MED ORDER — FLUCONAZOLE 150 MG PO TABS: 150.0000 mg | ORAL_TABLET | ORAL | 0 refills | Status: DC

## 2022-06-30 NOTE — Patient Instructions (Signed)
Jock Itch Jock itch (tinea cruris) is an infection of the skin in the groin area that is caused by a fungus. Jock itch causes an itchy rash in the groin and upper thigh area. It usually goes away in 2-3 weeks with treatment. What are the causes? The fungus that causes jock itch may be spread by: Touching a fungal infection elsewhere on your body, such as athlete's foot, and then touching your groin area. Sharing towels or clothing, such as socks or shoes, with someone who has a fungal infection. What increases the risk? Jock itch is most common in men and adolescent boys. You are also more likely to develop the condition if you: Are in a hot, humid climate. Wear tight-fitting clothing or wet bathing suits for long periods of time. Play sports. Are overweight. Have diabetes. Have a weakened body defense system (immune system). Sweat a lot. What are the signs or symptoms? Symptoms of jock itch may include: A red, pink, or brown rash in the groin area. Blisters may be present. The rash may spread to the thighs, the opening between the buttocks (anus), and the buttocks. Dry and scaly skin on or around the rash. Itchiness. How is this diagnosed? In most cases, your health care provider can make the diagnosis by looking at your rash. In some cases, a sample of infected skin may be scraped off. This sample may be examined under a microscope (biopsy) or by trying to grow the fungus from the sample (culture). How is this treated? Treatment for this condition may include: Antifungal medicine to kill the fungus. This may be a skin cream, ointment, or powder, or it may be a medicine that you take by mouth (orally). Skin cream or ointment to reduce itching. Lifestyle changes, such as wearing looser clothing and caring for your skin. Follow these instructions at home: Skin care Apply skin creams, ointments, or powders exactly as told by your health care provider. Wear loose-fitting clothing that does  not rub against your groin area. Men should wear boxer shorts or loose-fitting underwear. Keep your groin area clean and dry. Change your underwear every day. Change out of wet bathing suits as soon as possible. After bathing, use a separate towel to gently dry your groin area thoroughly. Using a separate towel will help prevent spreading the infection to other parts of your body. Avoid hot baths and showers. Hot water can make itching worse. Do not scratch the affected area. General instructions Take and apply over-the-counter and prescription medicines only as told by your health care provider. Do not share towels, clothing, or personal items with other people. Wash your hands often with soap and water for at least 20 seconds, especially after touching your groin area. If soap and water are not available, use hand sanitizer. When at the gym: Always wear shoes, especially in the shower and around the swimming pool. Keep any cuts covered. Disinfect any mats or equipment before using them. Shower immediately after working out. Keep all follow-up visits. This is important. Contact a health care provider if: Your rash: Gets worse or does not get better after 2 weeks of treatment. Spreads. Returns after treatment is finished. You have any of the following: A fever. New or worsening redness, swelling, or pain around your rash. Fluid, blood, or pus coming from your rash. Summary Jock itch (tinea cruris) is a fungal infection of the skin in the groin area. The fungus can be spread by sharing clothing or by touching a fungus infection   elsewhere on your body and then touching your groin area. Treatment may include antifungal medicine and lifestyle changes, such as keeping the area clean and dry. This information is not intended to replace advice given to you by your health care provider. Make sure you discuss any questions you have with your health care provider. Document Revised: 07/01/2020  Document Reviewed: 07/01/2020 Elsevier Patient Education  2023 [employer]  

## 2022-06-30 NOTE — Progress Notes (Unsigned)
Subjective:  Patient ID: Curtis Vance, male    DOB: [birth year]  Age: 77 y.o. MRN: [id number]  CC: No chief complaint on file.   HPI Curtis Vance presents for ***  Outpatient Medications Prior to Visit  Medication Sig Dispense Refill   acidophilus (RISAQUAD) CAPS capsule Take 1 capsule by mouth daily.     albuterol (PROVENTIL) (2.5 MG/3ML) 0.083% nebulizer solution USE 1 VIAL IN NEBULIZER EVERY 6 HOURS - as needed for wheezing or shortness of breath 50 mL 11   aspirin 81 MG tablet Take 1 tablet (81 mg total) by mouth 2 (two) times daily at 10 AM and 5 PM. (Patient taking differently: Take 81 mg by mouth daily.) 60 tablet 0   Brimonidine Tartrate (LUMIFY) 0.025 % SOLN Apply 1 drop to eye once. Apply to both eyes up to four times a day if needed for red eyes     cloNIDine (CATAPRES - DOSED IN MG/24 HR) 0.3 mg/24hr patch APPLY 1 PATCH TOPICALLY TO SKIN  ONCE WEEKLY 12 patch 0   Coenzyme Q10 200 MG capsule Take 30 mg by mouth daily.     Garlic 123XX123 MG CAPS Take 1 capsule by mouth daily.     Glucosamine 500 MG CAPS Take 2 capsules by mouth daily.     levothyroxine (SYNTHROID) 75 MCG tablet Take 75 mcg by mouth daily before breakfast.     Levothyroxine Sodium 88 MCG CAPS Take by mouth.     Multiple Vitamin (MULTIVITAMIN WITH MINERALS) TABS tablet Take 1 tablet by mouth daily.     olopatadine (PATANOL) 0.1 % ophthalmic solution Place 1 drop into both eyes 2 (two) times daily. 5 mL 5   Potassium Chloride ER (KLOR-CON M15) 15 MEQ TBCR TAKE 1 TABLET (15 MEQ TOTAL) BY MOUTH 2 (TWO) TIMES DAILY. 180 each 0   pravastatin (PRAVACHOL) 10 MG tablet TAKE 1 TABLET BY MOUTH EVERY DAY 90 tablet 0   TRELEGY ELLIPTA 100-62.5-25 MCG/ACT AEPB TAKE 1 PUFF BY MOUTH EVERY DAY 120 each 1   chlorthalidone (HYGROTEN) 15 MG tablet Take 1 tablet (15 mg total) by mouth daily. (Patient not taking: Reported on 06/30/2022) 90 tablet 0   omeprazole (PRILOSEC) 40 MG capsule TAKE 1 CAPSULE BY MOUTH EVERY DAY BEFORE  BREAKFAST (Patient not taking: Reported on 06/30/2022) 90 capsule 0   Calcium Carbonate-Vitamin D3 600-400 MG-UNIT TABS Take 1 tablet by mouth daily. (Patient not taking: Reported on 06/30/2022)     No facility-administered medications prior to visit.    ROS Review of Systems  Objective:  BP (!) 140/88 (BP Location: Right Arm, Patient Position: Sitting, Cuff Size: Normal)   Pulse 71   Temp 97.8 F (36.6 C) (Oral)   Ht '5\' 7"'$  (1.702 m)   Wt 179 lb (81.2 kg)   SpO2 97%   BMI 28.04 kg/m   BP Readings from Last 3 Encounters:  06/30/22 (!) 140/88  03/31/22 138/80  12/30/21 136/78    Wt Readings from Last 3 Encounters:  06/30/22 179 lb (81.2 kg)  03/31/22 175 lb (79.4 kg)  12/30/21 170 lb (77.1 kg)    Physical Exam Exam conducted with a chaperone present.  Abdominal:     Hernia: There is no hernia in the left inguinal area or right inguinal area.  Genitourinary:    Pubic Area: Rash present.     Penis: Normal and uncircumcised. No phimosis, paraphimosis, hypospadias, erythema, tenderness, discharge, swelling or lesions.      Testes: Normal.  Epididymis:     Right: Normal.     Left: Normal.  Lymphadenopathy:     Lower Body: No right inguinal adenopathy. No left inguinal adenopathy.     Lab Results  Component Value Date   WBC 6.7 03/31/2022   HGB 13.2 03/31/2022   HCT 39.5 03/31/2022   PLT 239.0 03/31/2022   GLUCOSE 92 03/31/2022   CHOL 186 03/31/2022   TRIG 160.0 (H) 03/31/2022   HDL 49.30 03/31/2022   LDLCALC 105 (H) 03/31/2022   ALT 23 03/31/2022   AST 39 (H) 03/31/2022   NA 138 03/31/2022   K 3.4 (L) 03/31/2022   CL 99 03/31/2022   CREATININE 1.07 03/31/2022   BUN 17 03/31/2022   CO2 33 (H) 03/31/2022   TSH 5.04 03/31/2022   PSA 3.34 02/25/2021   INR 1.1 11/27/2018   HGBA1C 5.1 12/15/2015    CT CERVICAL SPINE WO CONTRAST  Result Date: 05/28/2022 CLINICAL DATA:  Severe neck pain. EXAM: CT CERVICAL SPINE WITHOUT CONTRAST TECHNIQUE: Multidetector CT  imaging of the cervical spine was performed without intravenous contrast. Multiplanar CT image reconstructions were also generated. RADIATION DOSE REDUCTION: This exam was performed according to the departmental dose-optimization program which includes automated exposure control, adjustment of the mA and/or kV according to patient size and/or use of iterative reconstruction technique. COMPARISON:  Cervical spine CT 02/09/2019 FINDINGS: Alignment: The tip of the dens demonstrates 5 mm of posterior subluxation in there is increased widening between the dens and anterior arch of C1 when compared to the prior study. Alignment is otherwise anatomic. Skull base and vertebrae: There is no acute fracture. Anterior fusion plate is seen at [id number] and C5-C6. Posterior fusion hardware seen at C5, C6, C7 and T1. This is similar to prior. There is no evidence for hardware loosening. Soft tissues and spinal canal: Evaluation of central spinal canal and paravertebral soft tissues is limited secondary to to streak artifact from hardware. No definite acute abnormality identified. No prevertebral soft tissue swelling. Disc levels: Incorporated disc space graft material seen throughout the surgical levels. There is severe degenerative C2-C3 similar to the prior study. Progression of degenerative changes at C1-C2 as described above. At the level of C1 there is severe central canal stenosis (AP diameter 4.5 mm) which has increased from prior. Moderate central canal stenosis at C2-C3 appears similar. No severe central canal stenosis at any level. Upper chest: Emphysema present. Other: None. IMPRESSION: 1. No acute fracture. 2. Progression of degenerative changes at C1-C2 with new widening between the anterior arch of C1 and the dens and new posterior subluxation of the tip of the dens. There is new/progression of severe central canal stenosis at this level. 3. Stable postsurgical changes of the cervical spine. No evidence for hardware  loosening. Electronically Signed   By: Ronney Asters M.D.   On: 05/28/2022 20:22    Assessment & Plan:   Diagnoses and all orders for this visit:  Tinea of groin -     fluconazole (DIFLUCAN) 150 MG tablet; Take 1 tablet (150 mg total) by mouth once a week.  Lichen simplex chronicus -     triamcinolone cream (KENALOG) 0.5 %; Apply 1 Application topically 3 (three) times daily.   I have discontinued Madison C. Goodloe's Calcium Carbonate-Vitamin D3. I am also having him start on fluconazole and triamcinolone cream. Additionally, I am having him maintain his acidophilus, multivitamin with minerals, aspirin, Garlic, Coenzyme [id number], Glucosamine, Lumify, Levothyroxine Sodium, olopatadine, omeprazole, levothyroxine, Trelegy Ellipta, Klor-Con M15, pravastatin,  albuterol, cloNIDine, and chlorthalidone.  Meds ordered this encounter  Medications   fluconazole (DIFLUCAN) 150 MG tablet    Sig: Take 1 tablet (150 mg total) by mouth once a week.    Dispense:  4 tablet    Refill:  0   triamcinolone cream (KENALOG) 0.5 %    Sig: Apply 1 Application topically 3 (three) times daily.    Dispense:  30 g    Refill:  3     Follow-up: No follow-ups on file.  Scarlette Calico, MD

## 2022-07-07 ENCOUNTER — Ambulatory Visit: Payer: Medicare Other | Admitting: Family Medicine

## 2022-07-07 ENCOUNTER — Ambulatory Visit: Payer: Self-pay

## 2022-07-07 VITALS — BP 166/92 | HR 77 | Ht 67.0 in | Wt 178.0 lb

## 2022-07-07 DIAGNOSIS — M7021 Olecranon bursitis, right elbow: Secondary | ICD-10-CM | POA: Diagnosis not present

## 2022-07-07 NOTE — Patient Instructions (Addendum)
Thank you for coming in today.   You received an injection today. Seek immediate medical attention if the joint becomes red, extremely painful, or is oozing fluid.   I recommend you obtained a compression sleeve to help with your joint problems. There are many options on the market however I recommend obtaining a FULL ELBOW Body Helix compression sleeve.  You can find information (including how to appropriate measure yourself for sizing) can be found at www.Body http://www.lambert.com/.  Many of these products are health savings account (HSA) eligible.   You can use the compression sleeve at any time throughout the day but is most important to use while being active as well as for 2 hours post-activity.   It is appropriate to ice following activity with the compression sleeve in place.   Check back in 1 month

## 2022-07-07 NOTE — Progress Notes (Unsigned)
Shirlyn Goltz, PhD, LAT, ATC acting as a scribe for Lynne Leader, MD.  Subjective:    CC: Right elbow pain  HPI: Patient is a 77 year old male presenting with right elbow pain ongoing for a month to 6 weeks.  No injury or trauma.  Patient notes there is no pain over the area. Pt locates area to the posterior aspect of the R elbow. Pt reports the swelling has gone down somewhat.   R elbow swelling: yes Grip strength: normal Paresthesia: no Aggravates: no Treatments tried: alcohol   Pertinent review of Systems: No fevers or chills  Relevant historical information: History of peripheral arterial disease.  History of COPD.  History of cervical spine fusion.   Objective:    Vitals:   07/07/22 1423  BP: (!) 166/92  Pulse: 77  SpO2: 96%   General: Well Developed, well nourished, and in no acute distress.   MSK: Right elbow: Swollen overlying olecranon.  Nontender.  Normal elbow motion.  Intact strength.  Lab and Radiology Results  Procedure: Real-time Ultrasound Guided aspiration and injection of right elbow olecranon bursitis Device: Philips Affiniti 50G Images permanently stored and available for review in PACS Verbal informed consent obtained.  Discussed risks and benefits of procedure. Warned about infection, bleeding, hyperglycemia damage to structures among others. Patient expresses understanding and agreement Time-out conducted.   Noted no overlying erythema, induration, or other signs of local infection.   Skin prepped in a sterile fashion.   Local anesthesia: Topical Ethyl chloride.   With sterile technique and under real time ultrasound guidance: 2 mL of lidocaine injected into subcutaneous tissue overlying right elbow olecranon bursa. Skin was then sterilized again with isopropyl alcohol. 18-gauge needle was used to access the bursa and 9 mL of somewhat cloudy serosanguineous fluid aspirated. Syringe was exchanged and 40 mg of Kenalog and 2 mL of Marcaine  were injected into the now decompressed bursa. Dressing and Ace wrap applied.  Patient tolerated the procedure well. Completed without difficulty   Pain immediately resolved suggesting accurate placement of the medication.   Advised to call if fevers/chills, erythema, induration, drainage, or persistent bleeding.   Images permanently stored and available for review in the ultrasound unit.  Impression: Technically successful ultrasound guided injection.        Impression and Recommendations:    Assessment and Plan: 77 y.o. male with right elbow olecranon bursitis.  The fundamental underlying etiology is unclear.  He cannot recall an injury.  Will send the fluid aspirated for analysis with culture and cell count differential crystal analysis.  In the meantime emphasize compression.  Ace wrap applied today.  Recommend elbow sleeve.  Recheck in 1 month.Marland Kitchen  PDMP not reviewed this encounter. Orders Placed This Encounter  Procedures   Anaerobic and Aerobic Culture    Synovial fluid    Standing Status:   Future    Number of Occurrences:   1    Standing Expiration Date:   07/07/2023   [place] LIMITED JOINT SPACE STRUCTURES UP RIGHT(NO LINKED CHARGES)    Order Specific Question:   Reason for Exam (SYMPTOM  OR DIAGNOSIS REQUIRED)    Answer:   right elbow pain    Order Specific Question:   Preferred imaging location?    Answer:   [facility]   Synovial Fluid Analysis, Complete    Synovial fluid    Standing Status:   Future    Number of Occurrences:   1    Standing Expiration Date:  07/07/2023   No orders of the defined types were placed in this encounter.   Discussed warning signs or symptoms. Please see discharge instructions. Patient expresses understanding.   The above documentation has been reviewed and is accurate and complete Lynne Leader, M.D.

## 2022-07-13 LAB — SYNOVIAL FLUID ANALYSIS, COMPLETE
Basophils, %: 0 %
Eosinophils-Synovial: 0 % (ref 0–2)
Lymphocytes-Synovial Fld: 11 % (ref 0–74)
Monocyte/Macrophage: 65 % (ref 0–69)
Neutrophil, Synovial: 24 % (ref 0–24)
Synoviocytes, %: 0 % (ref 0–15)
WBC, Synovial: 1008 cells/uL — ABNORMAL HIGH (ref <150)

## 2022-07-13 LAB — ANAEROBIC AND AEROBIC CULTURE
AER RESULT:: NO GROWTH
GRAM STAIN:: NONE SEEN
MICRO NUMBER:: 14687306
MICRO NUMBER:: 14687307
SPECIMEN QUALITY:: ADEQUATE
SPECIMEN QUALITY:: ADEQUATE

## 2022-07-14 NOTE — Progress Notes (Signed)
Culture of fluid from elbow is negative.  No bacteria grew.

## 2022-07-30 ENCOUNTER — Telehealth: Payer: Self-pay | Admitting: Internal Medicine

## 2022-07-30 NOTE — Telephone Encounter (Signed)
Pt has been scheduled for 4/16 @ 2.20pm. Pt is unable to come in any sooner due to other obligations of physical therapy and wife has dialysis.

## 2022-07-30 NOTE — Telephone Encounter (Signed)
Patient's wife called and said his blood pressure is staying in the 150s with his chlorthalidone (HYGROTEN) 15 MG tablet. He would like to go back to the generic 25 mg dosage he used to be on. They would like a call back at [phone].

## 2022-08-04 ENCOUNTER — Other Ambulatory Visit: Payer: Self-pay | Admitting: Internal Medicine

## 2022-08-04 ENCOUNTER — Ambulatory Visit: Payer: Medicare Other | Admitting: Family Medicine

## 2022-08-04 VITALS — BP 144/92 | HR 79 | Ht 67.0 in | Wt 176.0 lb

## 2022-08-04 DIAGNOSIS — M7021 Olecranon bursitis, right elbow: Secondary | ICD-10-CM | POA: Diagnosis not present

## 2022-08-04 NOTE — Patient Instructions (Signed)
Thank you for coming in today.   Continue compression as needed.   Ok to re-use it as needed.

## 2022-08-04 NOTE — Progress Notes (Unsigned)
   Rubin Payor, PhD, LAT, ATC acting as a scribe for Clementeen Graham, MD.   Curtis Vance is a 77 y.o. male who presents to [facility] today for follow-up right elbow pain due to olecranon bursitis.  Patient was last seen by Dr. Denyse Amass on 07/07/2022 and the bursa was aspirated and injected with a steroid, fluid cultured, and was advised to use an Ace wrap/elbow compression.  Today, patient reports R elbow is feeling fine. He wore the compression sleeve for about 2 weeks and swelling resolved.   Dx testing: 07/07/2022 synovial fluid culture  Pertinent review of systems: No fevers or chills  Relevant historical information: Hypertension   Exam:  BP (!) 144/92   Pulse 79   Ht 5\' 7"  (1.702 m)   Wt 176 lb (79.8 kg)   SpO2 95%   BMI 27.57 kg/m  General: Well Developed, well nourished, and in no acute distress.   MSK: Right elbow no significant swelling olecranon prominence. Nontender. Normal elbow motion and strength.    Assessment and Plan: 77 y.o. male with right olecranon bursitis.  Resolved with aspiration and injection and compression.  Continue elbow sleeve as needed.  Recheck back with me as needed.   Discussed warning signs or symptoms. Please see discharge instructions. Patient expresses understanding.   The above documentation has been reviewed and is accurate and complete Clementeen Graham, M.D.

## 2022-08-08 ENCOUNTER — Other Ambulatory Visit: Payer: Self-pay | Admitting: Internal Medicine

## 2022-08-08 DIAGNOSIS — I10 Essential (primary) hypertension: Secondary | ICD-10-CM

## 2022-08-08 DIAGNOSIS — E876 Hypokalemia: Secondary | ICD-10-CM

## 2022-08-10 ENCOUNTER — Ambulatory Visit: Payer: Medicare Other | Admitting: Internal Medicine

## 2022-08-16 ENCOUNTER — Telehealth: Payer: Self-pay | Admitting: Internal Medicine

## 2022-08-16 NOTE — Telephone Encounter (Signed)
Contacted Curtis Vance to schedule their annual wellness visit. Appointment made for 08/27/2022.  [facility] Care Guide [facility] AWV TEAM Direct Dial: [phone]

## 2022-08-18 ENCOUNTER — Ambulatory Visit (INDEPENDENT_AMBULATORY_CARE_PROVIDER_SITE_OTHER): Payer: Medicare Other

## 2022-08-18 ENCOUNTER — Encounter: Payer: Self-pay | Admitting: Internal Medicine

## 2022-08-18 ENCOUNTER — Ambulatory Visit: Payer: Medicare Other | Admitting: Internal Medicine

## 2022-08-18 VITALS — BP 132/78 | HR 79 | Temp 98.0°F | Resp 16 | Ht 67.0 in | Wt 174.0 lb

## 2022-08-18 DIAGNOSIS — E785 Hyperlipidemia, unspecified: Secondary | ICD-10-CM

## 2022-08-18 DIAGNOSIS — T466X5A Adverse effect of antihyperlipidemic and antiarteriosclerotic drugs, initial encounter: Secondary | ICD-10-CM

## 2022-08-18 DIAGNOSIS — I10 Essential (primary) hypertension: Secondary | ICD-10-CM | POA: Diagnosis not present

## 2022-08-18 DIAGNOSIS — G720 Drug-induced myopathy: Secondary | ICD-10-CM

## 2022-08-18 DIAGNOSIS — E876 Hypokalemia: Secondary | ICD-10-CM | POA: Diagnosis not present

## 2022-08-18 DIAGNOSIS — E890 Postprocedural hypothyroidism: Secondary | ICD-10-CM

## 2022-08-18 DIAGNOSIS — T502X5A Adverse effect of carbonic-anhydrase inhibitors, benzothiadiazides and other diuretics, initial encounter: Secondary | ICD-10-CM | POA: Diagnosis not present

## 2022-08-18 DIAGNOSIS — R052 Subacute cough: Secondary | ICD-10-CM | POA: Diagnosis not present

## 2022-08-18 DIAGNOSIS — J0140 Acute pansinusitis, unspecified: Secondary | ICD-10-CM

## 2022-08-18 DIAGNOSIS — B356 Tinea cruris: Secondary | ICD-10-CM

## 2022-08-18 LAB — CBC WITH DIFFERENTIAL/PLATELET
Basophils Absolute: 0 10*3/uL (ref 0.0–0.1)
Basophils Relative: 0.6 % (ref 0.0–3.0)
Eosinophils Absolute: 0 10*3/uL (ref 0.0–0.7)
Eosinophils Relative: 0.3 % (ref 0.0–5.0)
HCT: 39.7 % (ref 39.0–52.0)
Hemoglobin: 13.6 g/dL (ref 13.0–17.0)
Lymphocytes Relative: 26.3 % (ref 12.0–46.0)
Lymphs Abs: 1.7 10*3/uL (ref 0.7–4.0)
MCHC: 34.1 g/dL (ref 30.0–36.0)
MCV: 91.8 fl (ref 78.0–100.0)
Monocytes Absolute: 0.8 10*3/uL (ref 0.1–1.0)
Monocytes Relative: 12.6 % — ABNORMAL HIGH (ref 3.0–12.0)
Neutro Abs: 3.8 10*3/uL (ref 1.4–7.7)
Neutrophils Relative %: 60.2 % (ref 43.0–77.0)
Platelets: 203 10*3/uL (ref 150.0–400.0)
RBC: 4.33 Mil/uL (ref 4.22–5.81)
RDW: 14.1 % (ref 11.5–15.5)
WBC: 6.3 10*3/uL (ref 4.0–10.5)

## 2022-08-18 LAB — BASIC METABOLIC PANEL
BUN: 17 mg/dL (ref 6–23)
CO2: 36 mEq/L — ABNORMAL HIGH (ref 19–32)
Calcium: 9.7 mg/dL (ref 8.4–10.5)
Chloride: 94 mEq/L — ABNORMAL LOW (ref 96–112)
Creatinine, Ser: 1.21 mg/dL (ref 0.40–1.50)
GFR: 57.25 mL/min — ABNORMAL LOW (ref >60.00)
Glucose, Bld: 86 mg/dL (ref 70–99)
Potassium: 3.2 mEq/L — ABNORMAL LOW (ref 3.5–5.1)
Sodium: 139 mEq/L (ref 135–145)

## 2022-08-18 LAB — CK: Total CK: 529 U/L — ABNORMAL HIGH (ref 7–232)

## 2022-08-18 LAB — TSH: TSH: 1.54 u[IU]/mL (ref 0.35–5.50)

## 2022-08-18 LAB — MAGNESIUM: Magnesium: 1.9 mg/dL (ref 1.5–2.5)

## 2022-08-18 MED ORDER — POTASSIUM CHLORIDE ER 15 MEQ PO TBCR
1.0000 | EXTENDED_RELEASE_TABLET | Freq: Three times a day (TID) | ORAL | 1 refills | Status: DC
Start: 2022-08-18 — End: 2022-08-23

## 2022-08-18 MED ORDER — AMOXICILLIN-POT CLAVULANATE 875-125 MG PO TABS
1.0000 | ORAL_TABLET | Freq: Two times a day (BID) | ORAL | 0 refills | Status: AC
Start: 2022-08-18 — End: 2022-08-25

## 2022-08-18 MED ORDER — CICLOPIROX OLAMINE 0.77 % EX CREA
TOPICAL_CREAM | Freq: Two times a day (BID) | CUTANEOUS | 2 refills | Status: AC
Start: 2022-08-18 — End: ?

## 2022-08-18 MED ORDER — UNITHROID 75 MCG PO TABS
75.0000 ug | ORAL_TABLET | Freq: Every day | ORAL | 1 refills | Status: AC
Start: 2022-08-18 — End: ?

## 2022-08-18 MED ORDER — HYDROCODONE BIT-HOMATROP MBR 5-1.5 MG/5ML PO SOLN
5.0000 mL | Freq: Three times a day (TID) | ORAL | 0 refills | Status: AC | PRN
Start: 2022-08-18 — End: 2022-08-26

## 2022-08-18 NOTE — Progress Notes (Signed)
Subjective:  Patient ID: Curtis Vance, male    DOB: 1945/12/30  Age: 77 y.o. MRN: [id number]  CC: Hypertension, Rash, Cough, and COPD   HPI Curtis Vance presents for f/up ---  He complains of a 2 week hx of nonproductive cough, thick nasal phlegm that is yellow/green/bloody, shortness of breath, and nasal congestion.  He denies hemoptysis, chest pain, diaphoresis, fever, or chills.   Outpatient Medications Prior to Visit  Medication Sig Dispense Refill   albuterol (PROVENTIL) (2.5 MG/3ML) 0.083% nebulizer solution USE 1 VIAL IN NEBULIZER EVERY 6 HOURS - as needed for wheezing or shortness of breath 50 mL 11   aspirin 81 MG tablet Take 1 tablet (81 mg total) by mouth 2 (two) times daily at 10 AM and 5 PM. (Patient taking differently: Take 81 mg by mouth daily.) 60 tablet 0   Brimonidine Tartrate (LUMIFY) 0.025 % SOLN Apply 1 drop to eye once. Apply to both eyes up to four times a day if needed for red eyes     cloNIDine (CATAPRES - DOSED IN MG/24 HR) 0.3 mg/24hr patch APPLY 1 PATCH TOPICALLY TO SKIN  ONCE WEEKLY 12 patch 0   cloNIDine (CATAPRES) 0.1 MG tablet TAKE 1 TABLET BY MOUTH 2 TIMES DAILY. 180 tablet 1   Multiple Vitamin (MULTIVITAMIN WITH MINERALS) TABS tablet Take 1 tablet by mouth daily.     olopatadine (PATANOL) 0.1 % ophthalmic solution Place 1 drop into both eyes 2 (two) times daily. 5 mL 5   omeprazole (PRILOSEC) 40 MG capsule TAKE 1 CAPSULE BY MOUTH EVERY DAY BEFORE BREAKFAST 90 capsule 0   TRELEGY ELLIPTA 100-62.5-25 MCG/ACT AEPB TAKE 1 PUFF BY MOUTH EVERY DAY 120 each 1   triamcinolone cream (KENALOG) 0.5 % Apply 1 Application topically 3 (three) times daily. 30 g 3   acidophilus (RISAQUAD) CAPS capsule Take 1 capsule by mouth daily.     chlorthalidone (HYGROTEN) 15 MG tablet Take 1 tablet (15 mg total) by mouth daily. 90 tablet 0   Coenzyme Q10 200 MG capsule Take 30 mg by mouth daily.     fluconazole (DIFLUCAN) 150 MG tablet Take 1 tablet (150 mg total) by mouth  once a week. 4 tablet 0   Garlic 1000 MG CAPS Take 1 capsule by mouth daily.     Glucosamine 500 MG CAPS Take 2 capsules by mouth daily.     levothyroxine (SYNTHROID) 75 MCG tablet Take 75 mcg by mouth daily before breakfast.     Levothyroxine Sodium 88 MCG CAPS Take by mouth.     Potassium Chloride ER (KLOR-CON M15) 15 MEQ TBCR TAKE 1 TABLET (15 MEQ TOTAL) BY MOUTH 2 (TWO) TIMES DAILY. 180 tablet 0   pravastatin (PRAVACHOL) 10 MG tablet TAKE 1 TABLET BY MOUTH EVERY DAY 90 tablet 0   No facility-administered medications prior to visit.    ROS Review of Systems  Constitutional:  Negative for chills, diaphoresis, fatigue and fever.  HENT:  Positive for congestion, nosebleeds, postnasal drip, rhinorrhea, sinus pressure and sinus pain. Negative for sore throat and trouble swallowing.   Eyes:  Negative for visual disturbance.  Respiratory:  Positive for cough and shortness of breath. Negative for chest tightness and wheezing.   Cardiovascular:  Negative for chest pain, palpitations and leg swelling.  Gastrointestinal: Negative.  Negative for abdominal pain, diarrhea and vomiting.  Genitourinary: Negative.  Negative for difficulty urinating and frequency.  Musculoskeletal:  Positive for arthralgias and myalgias.  Skin:  Positive for rash.  Neurological: Negative.  Negative for dizziness and weakness.  Hematological:  Negative for adenopathy. Does not bruise/bleed easily.  Psychiatric/Behavioral: Negative.      Objective:  BP 132/78 (BP Location: Right Arm, Patient Position: Sitting, Cuff Size: Large)   Pulse 79   Temp 98 F (36.7 C) (Oral)   Resp 16   Ht 5\' 7"  (1.702 m)   Wt 174 lb (78.9 kg)   SpO2 95%   BMI 27.25 kg/m   BP Readings from Last 3 Encounters:  08/18/22 132/78  08/04/22 (!) 144/92  07/07/22 (!) 166/92    Wt Readings from Last 3 Encounters:  08/18/22 174 lb (78.9 kg)  08/04/22 176 lb (79.8 kg)  07/07/22 178 lb (80.7 kg)    Physical Exam Vitals reviewed.   Constitutional:      General: He is not in acute distress.    Appearance: He is not toxic-appearing or diaphoretic.  HENT:     Mouth/Throat:     Mouth: Mucous membranes are moist.  Eyes:     General: No scleral icterus.    Conjunctiva/sclera: Conjunctivae normal.  Cardiovascular:     Rate and Rhythm: Normal rate and regular rhythm. Occasional Extrasystoles are present.    Heart sounds: No murmur heard. Pulmonary:     Breath sounds: Examination of the right-lower field reveals rales. Rales present. No decreased breath sounds, wheezing or rhonchi.  Abdominal:     General: Abdomen is flat.     Palpations: There is no mass.     Tenderness: There is no abdominal tenderness. There is no guarding.     Hernia: No hernia is present.  Musculoskeletal:        General: Normal range of motion.     Cervical back: Neck supple.     Right lower leg: No edema.     Left lower leg: No edema.  Lymphadenopathy:     Cervical: No cervical adenopathy.  Skin:    General: Skin is warm and dry.     Findings: No rash.  Neurological:     General: No focal deficit present.     Mental Status: He is alert. Mental status is at baseline.     Motor: Weakness present.     Gait: Gait abnormal.  Psychiatric:        Mood and Affect: Mood normal.     Lab Results  Component Value Date   WBC 6.3 08/18/2022   HGB 13.6 08/18/2022   HCT 39.7 08/18/2022   PLT 203.0 08/18/2022   GLUCOSE 86 08/18/2022   CHOL 186 03/31/2022   TRIG 160.0 (H) 03/31/2022   HDL 49.30 03/31/2022   LDLCALC 105 (H) 03/31/2022   ALT 23 03/31/2022   AST 39 (H) 03/31/2022   NA 139 08/18/2022   K 3.2 (L) 08/18/2022   CL 94 (L) 08/18/2022   CREATININE 1.21 08/18/2022   BUN 17 08/18/2022   CO2 36 (H) 08/18/2022   TSH 1.54 08/18/2022   PSA 3.34 02/25/2021   INR 1.1 11/27/2018   HGBA1C 5.1 12/15/2015    CT CERVICAL SPINE WO CONTRAST  Result Date: 05/28/2022 CLINICAL DATA:  Severe neck pain. EXAM: CT CERVICAL SPINE WITHOUT CONTRAST  TECHNIQUE: Multidetector CT imaging of the cervical spine was performed without intravenous contrast. Multiplanar CT image reconstructions were also generated. RADIATION DOSE REDUCTION: This exam was performed according to the departmental dose-optimization program which includes automated exposure control, adjustment of the mA and/or kV according to patient size and/or use of iterative reconstruction  technique. COMPARISON:  Cervical spine CT 02/09/2019 FINDINGS: Alignment: The tip of the dens demonstrates 5 mm of posterior subluxation in there is increased widening between the dens and anterior arch of C1 when compared to the prior study. Alignment is otherwise anatomic. Skull base and vertebrae: There is no acute fracture. Anterior fusion plate is seen at C3-C4 and C5-C6. Posterior fusion hardware seen at C5, C6, C7 and T1. This is similar to prior. There is no evidence for hardware loosening. Soft tissues and spinal canal: Evaluation of central spinal canal and paravertebral soft tissues is limited secondary to to streak artifact from hardware. No definite acute abnormality identified. No prevertebral soft tissue swelling. Disc levels: Incorporated disc space graft material seen throughout the surgical levels. There is severe degenerative C2-C3 similar to the prior study. Progression of degenerative changes at C1-C2 as described above. At the level of C1 there is severe central canal stenosis (AP diameter 4.5 mm) which has increased from prior. Moderate central canal stenosis at C2-C3 appears similar. No severe central canal stenosis at any level. Upper chest: Emphysema present. Other: None. IMPRESSION: 1. No acute fracture. 2. Progression of degenerative changes at C1-C2 with new widening between the anterior arch of C1 and the dens and new posterior subluxation of the tip of the dens. There is new/progression of severe central canal stenosis at this level. 3. Stable postsurgical changes of the cervical spine.  No evidence for hardware loosening. Electronically Signed   By: Darliss Cheney M.D.   On: 05/28/2022 20:22   DG Chest 2 View  Result Date: 08/18/2022 CLINICAL DATA:  Shortness of breath, cough EXAM: CHEST - 2 VIEW COMPARISON:  11/30/2021 FINDINGS: Cardiac and mediastinal contours are within normal limits. No new focal pulmonary opacity. Redemonstrated linear opacities in the bilateral lower lungs, likely atelectasis and/or scarring. No pleural effusion or pneumothorax. No acute osseous abnormality. Redemonstrated metallic objects overlying the left shoulder and axilla on the PA radiograph. IMPRESSION: No acute cardiopulmonary process. Electronically Signed   By: Wiliam Ke M.D.   On: 08/18/2022 14:42     Assessment & Plan:   Diuretic-induced hypokalemia- His potassium is down to 3.2.  Will discontinue the thiazide diuretic and increase the potassium dosage. -     Basic metabolic panel; Future -     Magnesium; Future -     Potassium Chloride ER; Take 1 tablet (15 mEq total) by mouth 3 (three) times daily with meals.  Dispense: 270 tablet; Refill: 1  Essential hypertension- His blood pressure is somewhat overcontrolled.  Will discontinue the thiazide diuretic. -     CBC with Differential/Platelet; Future -     Basic metabolic panel; Future -     Potassium Chloride ER; Take 1 tablet (15 mEq total) by mouth 3 (three) times daily with meals.  Dispense: 270 tablet; Refill: 1  Hyperlipidemia with target LDL less than 130 -     CK; Future  Subacute cough -     DG Chest 2 View; Future -     HYDROcodone Bit-Homatrop MBr; Take 5 mLs by mouth every 8 (eight) hours as needed for up to 8 days for cough.  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 -     TSH; Future -     Unithroid; Take 1 tablet (75 mcg total) by mouth daily before breakfast.  Dispense: 90 tablet; Refill: 1  Acute non-recurrent pansinusitis -     Amoxicillin-Pot Clavulanate; Take 1 tablet by mouth 2  (  two) times daily for 7 days.  Dispense: 14 tablet; Refill: 0 -     HYDROcodone Bit-Homatrop MBr; Take 5 mLs by mouth every 8 (eight) hours as needed for up to 8 days for cough.  Dispense: 120 mL; Refill: 0  Tinea of groin -     Ciclopirox Olamine; Apply topically 2 (two) times daily.  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     Follow-up: Return in about 3 months (around 11/17/2022).  Sanda Linger, MD

## 2022-08-18 NOTE — Patient Instructions (Signed)
Cough, Adult Coughing is a reflex that clears your throat and airways (respiratory system). It helps heal and protect your lungs. It is normal to cough from time to time. A cough that happens with other symptoms or that lasts a long time may be a sign of a condition that needs treatment. A short-term (acute) cough may only last 2-3 weeks. A long-term (chronic) cough may last 8 or more weeks. Coughing is often caused by: Diseases, such as: An infection of the respiratory system. Asthma or other heart or lung diseases. Gastroesophageal reflux. This is when acid comes back up from the stomach. Breathing in things that irritate your lungs. Allergies. Postnasal drip. This is when mucus runs down the back of your throat. Smoking. Some medicines. Follow these instructions at home: Medicines Take over-the-counter and prescription medicines only as told by your health care provider. Talk with your provider before you take cough medicine (cough suppressants). Eating and drinking Do not drink alcohol. Avoid caffeine. Drink enough fluid to keep your pee (urine) pale yellow. Lifestyle Avoid cigarette smoke. Do not use any products that contain nicotine or tobacco. These products include cigarettes, chewing tobacco, and vaping devices, such as e-cigarettes. If you need help quitting, ask your provider. Avoid things that make you cough. These may include perfumes, candles, cleaning products, or campfire smoke. General instructions  Watch for any changes to your cough. Tell your provider about them. Always cover your mouth when you cough. If the air is dry in your bedroom or home, use a cool mist vaporizer or humidifier. If your cough is worse at night, try to sleep in a semi-upright position. Rest as needed. Contact a health care provider if: You have new symptoms, or your symptoms get worse. You cough up pus. You have a fever that does not go away or a cough that does not get better after 2-3  weeks. You cannot control your cough with medicine, and you are losing sleep. You have pain that gets worse or is not helped with medicine. You lose weight for no clear reason. You have night sweats. Get help right away if: You cough up blood. You have trouble breathing. Your heart is beating very fast. These symptoms may be an emergency. Get help right away. Call 911. Do not wait to see if the symptoms will go away. Do not drive yourself to the hospital. This information is not intended to replace advice given to you by your health care provider. Make sure you discuss any questions you have with your health care provider. Document Revised: 12/11/2021 Document Reviewed: 12/11/2021 Elsevier Patient Education  2023 [employer]  

## 2022-08-19 DIAGNOSIS — G729 Myopathy, unspecified: Secondary | ICD-10-CM | POA: Insufficient documentation

## 2022-08-19 DIAGNOSIS — T466X5A Adverse effect of antihyperlipidemic and antiarteriosclerotic drugs, initial encounter: Secondary | ICD-10-CM | POA: Insufficient documentation

## 2022-08-19 DIAGNOSIS — J0140 Acute pansinusitis, unspecified: Secondary | ICD-10-CM | POA: Insufficient documentation

## 2022-08-19 DIAGNOSIS — R052 Subacute cough: Secondary | ICD-10-CM | POA: Insufficient documentation

## 2022-08-23 ENCOUNTER — Telehealth: Payer: Self-pay | Admitting: Internal Medicine

## 2022-08-23 ENCOUNTER — Other Ambulatory Visit: Payer: Self-pay | Admitting: Internal Medicine

## 2022-08-23 DIAGNOSIS — I10 Essential (primary) hypertension: Secondary | ICD-10-CM

## 2022-08-23 DIAGNOSIS — E876 Hypokalemia: Secondary | ICD-10-CM

## 2022-08-23 MED ORDER — POTASSIUM CHLORIDE ER 10 MEQ PO TBCR
10.0000 meq | EXTENDED_RELEASE_TABLET | Freq: Three times a day (TID) | ORAL | 0 refills | Status: DC
Start: 2022-08-23 — End: 2022-11-20

## 2022-08-23 NOTE — Telephone Encounter (Signed)
Patient's wife called and said that the antibiotic is helping but he feels like he needs prednisone to help get up the congestion.  Please call patient and advise.  Patient would like it called in to [facility] on [address] in [place]

## 2022-08-27 ENCOUNTER — Ambulatory Visit (INDEPENDENT_AMBULATORY_CARE_PROVIDER_SITE_OTHER): Payer: Medicare Other

## 2022-08-27 VITALS — Ht 67.0 in | Wt 174.0 lb

## 2022-08-27 DIAGNOSIS — Z0000 Encounter for general adult medical examination without abnormal findings: Secondary | ICD-10-CM
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
## 2022-08-27 NOTE — Patient Instructions (Signed)
Curtis Vance , Thank you for taking time to come for your Medicare Wellness Visit. I appreciate your ongoing commitment to your health goals. Please review the following plan we discussed and let me know if I can assist you in the future.   These are the goals we discussed:  Goals      Patient declined goals at this time.     Patient Stated     Continue work-out daily to maintain my strength and stay as healthy and as independent as possible.         This is a list of the screening recommended for you and due dates:  Health Maintenance  Topic Date Due   Flu Shot  11/25/2022   Medicare Annual Wellness Visit  08/27/2023   DTaP/Tdap/Td vaccine (3 - Tdap) 02/13/2024   Pneumonia Vaccine  Completed   COVID-19 Vaccine  Completed   Hepatitis C Screening: USPSTF Recommendation to screen - Ages 29-79 yo.  Completed   Zoster (Shingles) Vaccine  Completed   HPV Vaccine  Aged Out    Advanced directives:   Discussed with patient  Conditions/risks identified: Keep up the good work  Next appointment: Follow up in one year for your annual wellness visit. 08/31/23  Preventive Care 65 Years and Older, Male  Preventive care refers to lifestyle choices and visits with your health care provider that can promote health and wellness. What does preventive care include? A yearly physical exam. This is also called an annual well check. Dental exams once or twice a year. Routine eye exams. Ask your health care provider how often you should have your eyes checked. Personal lifestyle choices, including: Daily care of your teeth and gums. Regular physical activity. Eating a healthy diet. Avoiding tobacco and drug use. Limiting alcohol use. Practicing safe sex. Taking low doses of aspirin every day. Taking vitamin and mineral supplements as recommended by your health care provider. What happens during an annual well check? The services and screenings done by your health care provider during your annual  well check will depend on your age, overall health, lifestyle risk factors, and family history of disease. Counseling  Your health care provider may ask you questions about your: Alcohol use. Tobacco use. Drug use. Emotional well-being. Home and relationship well-being. Sexual activity. Eating habits. History of falls. Memory and ability to understand (cognition). Work and work Astronomer. Screening  You may have the following tests or measurements: Height, weight, and BMI. Blood pressure. Lipid and cholesterol levels. These may be checked every 5 years, or more frequently if you are over 57 years old. Skin check. Lung cancer screening. You may have this screening every year starting at age 52 if you have a 30-pack-year history of smoking and currently smoke or have quit within the past 15 years. Fecal occult blood test (FOBT) of the stool. You may have this test every year starting at age 45. Flexible sigmoidoscopy or colonoscopy. You may have a sigmoidoscopy every 5 years or a colonoscopy every 10 years starting at age 16. Prostate cancer screening. Recommendations will vary depending on your family history and other risks. Hepatitis C blood test. Hepatitis B blood test. Sexually transmitted disease (STD) testing. Diabetes screening. This is done by checking your blood sugar (glucose) after you have not eaten for a while (fasting). You may have this done every 1-3 years. Abdominal aortic aneurysm (AAA) screening. You may need this if you are a current or former smoker. Osteoporosis. You may be screened starting at  age 85 if you are at high risk. Talk with your health care provider about your test results, treatment options, and if necessary, the need for more tests. Vaccines  Your health care provider may recommend certain vaccines, such as: Influenza vaccine. This is recommended every year. Tetanus, diphtheria, and acellular pertussis (Tdap, Td) vaccine. You may need a Td booster  every 10 years. Zoster vaccine. You may need this after age 73. Pneumococcal 13-valent conjugate (PCV13) vaccine. One dose is recommended after age 76. Pneumococcal polysaccharide (PPSV23) vaccine. One dose is recommended after age 58. Talk to your health care provider about which screenings and vaccines you need and how often you need them. This information is not intended to replace advice given to you by your health care provider. Make sure you discuss any questions you have with your health care provider. Document Released: 05/09/2015 Document Revised: 12/31/2015 Document Reviewed: 02/11/2015 Elsevier Interactive Patient Education  2017 [place] Prevention in the Home Falls can cause injuries. They can happen to people of all ages. There are many things you can do to make your home safe and to help prevent falls. What can I do on the outside of my home? Regularly fix the edges of walkways and driveways and fix any cracks. Remove anything that might make you trip as you walk through a door, such as a raised step or threshold. Trim any bushes or trees on the path to your home. Use bright outdoor lighting. Clear any walking paths of anything that might make someone trip, such as rocks or tools. Regularly check to see if handrails are loose or broken. Make sure that both sides of any steps have handrails. Any raised decks and porches should have guardrails on the edges. Have any leaves, snow, or ice cleared regularly. Use sand or salt on walking paths during winter. Clean up any spills in your garage right away. This includes oil or grease spills. What can I do in the bathroom? Use night lights. Install grab bars by the toilet and in the tub and shower. Do not use towel bars as grab bars. Use non-skid mats or decals in the tub or shower. If you need to sit down in the shower, use a plastic, non-slip stool. Keep the floor dry. Clean up any water that spills on the floor as soon  as it happens. Remove soap buildup in the tub or shower regularly. Attach bath mats securely with double-sided non-slip rug tape. Do not have throw rugs and other things on the floor that can make you trip. What can I do in the bedroom? Use night lights. Make sure that you have a light by your bed that is easy to reach. Do not use any sheets or blankets that are too big for your bed. They should not hang down onto the floor. Have a firm chair that has side arms. You can use this for support while you get dressed. Do not have throw rugs and other things on the floor that can make you trip. What can I do in the kitchen? Clean up any spills right away. Avoid walking on wet floors. Keep items that you use a lot in easy-to-reach places. If you need to reach something above you, use a strong step stool that has a grab bar. Keep electrical cords out of the way. Do not use floor polish or wax that makes floors slippery. If you must use wax, use non-skid floor wax. Do not have throw rugs and  other things on the floor that can make you trip. What can I do with my stairs? Do not leave any items on the stairs. Make sure that there are handrails on both sides of the stairs and use them. Fix handrails that are broken or loose. Make sure that handrails are as long as the stairways. Check any carpeting to make sure that it is firmly attached to the stairs. Fix any carpet that is loose or worn. Avoid having throw rugs at the top or bottom of the stairs. If you do have throw rugs, attach them to the floor with carpet tape. Make sure that you have a light switch at the top of the stairs and the bottom of the stairs. If you do not have them, ask someone to add them for you. What else can I do to help prevent falls? Wear shoes that: Do not have high heels. Have rubber bottoms. Are comfortable and fit you well. Are closed at the toe. Do not wear sandals. If you use a stepladder: Make sure that it is fully  opened. Do not climb a closed stepladder. Make sure that both sides of the stepladder are locked into place. Ask someone to hold it for you, if possible. Clearly mark and make sure that you can see: Any grab bars or handrails. First and last steps. Where the edge of each step is. Use tools that help you move around (mobility aids) if they are needed. These include: Canes. Walkers. Scooters. Crutches. Turn on the lights when you go into a dark area. Replace any light bulbs as soon as they burn out. Set up your furniture so you have a clear path. Avoid moving your furniture around. If any of your floors are uneven, fix them. If there are any pets around you, be aware of where they are. Review your medicines with your doctor. Some medicines can make you feel dizzy. This can increase your chance of falling. Ask your doctor what other things that you can do to help prevent falls. This information is not intended to replace advice given to you by your health care provider. Make sure you discuss any questions you have with your health care provider. Document Released: 02/06/2009 Document Revised: 09/18/2015 Document Reviewed: 05/17/2014 Elsevier Interactive Patient Education  2017 [employer].

## 2022-09-27 ENCOUNTER — Ambulatory Visit: Payer: Medicare Other | Admitting: Internal Medicine

## 2022-11-20 ENCOUNTER — Other Ambulatory Visit: Payer: Self-pay | Admitting: Internal Medicine

## 2022-11-20 DIAGNOSIS — E876 Hypokalemia: Secondary | ICD-10-CM

## 2022-11-20 DIAGNOSIS — I10 Essential (primary) hypertension: Secondary | ICD-10-CM

## 2023-02-28 ENCOUNTER — Other Ambulatory Visit: Payer: Self-pay | Admitting: Internal Medicine

## 2023-04-13 ENCOUNTER — Ambulatory Visit: Payer: Medicare Other | Admitting: Family Medicine

## 2023-04-13 ENCOUNTER — Ambulatory Visit (INDEPENDENT_AMBULATORY_CARE_PROVIDER_SITE_OTHER): Payer: Medicare Other

## 2023-04-13 ENCOUNTER — Other Ambulatory Visit: Payer: Self-pay

## 2023-04-13 ENCOUNTER — Encounter: Payer: Self-pay | Admitting: Family Medicine

## 2023-04-13 VITALS — BP 146/84 | HR 74 | Ht 67.0 in | Wt 171.0 lb

## 2023-04-13 DIAGNOSIS — G8929 Other chronic pain: Secondary | ICD-10-CM

## 2023-04-13 DIAGNOSIS — M25561 Pain in right knee: Secondary | ICD-10-CM | POA: Diagnosis not present

## 2023-04-13 NOTE — Patient Instructions (Signed)
Thank you for coming in today.  Please get an Xray today before you leave  You received an injection today. Seek immediate medical attention if the joint becomes red, extremely painful, or is oozing fluid.   Please use Voltaren gel (Generic Diclofenac Gel) up to 4x daily for pain as needed.  This is available over-the-counter as both the name brand Voltaren gel and the generic diclofenac gel.   Check back as needed

## 2023-04-13 NOTE — Progress Notes (Unsigned)
I, Stevenson Clinch, CMA acting as a scribe for Clementeen Graham, MD.  Curtis Vance is a 77 y.o. male who presents to [facility] today for R knee pain. Pt was previously seen by Dr. Denyse Amass on 08/04/22 for R olecranon bursitis.  Today, pt c/o R knee pain x 2-3 weeks, no known injury, s/p hip replacement in 2019. Pt locates pain to lateral aspect of the knee radiating into the distal anterior thigh and lower leg. Palpable mass proximal to patella. Some swelling present. Trouble standing from seated position. Pt notes that today the lower back feels OK. Ambulating with cane today. Sx improve with elevation. Notes constant aching. Thinks sx are related to complications with spinal surgery.   R Knee swelling: Yes Mechanical symptoms: no Aggravates: bending, squatting, hip flexion Treatments tried: Icy Hot, Voltaren Gel, Aloe, Tylenol  Pertinent review of systems: No fevers or chills  Relevant historical information: Peripheral arterial disease.  Chronic leg weakness following spine surgery.   Exam:  BP (!) 146/84   Pulse 74   Ht 5\' 7"  (1.702 m)   Wt 171 lb (77.6 kg)   SpO2 98%   BMI 26.78 kg/m  General: Well Developed, well nourished, and in no acute distress.   MSK: Right knee moderate effusion normal motion with crepitation.  Tender palpation medial joint line. Antalgic gait   Lab and Radiology Results  Procedure: Real-time Ultrasound Guided Injection of right knee joint superior lateral patella space Device: Philips Affiniti 50G/GE Logiq Images permanently stored and available for review in PACS Ultrasound evaluation prior to injection shows a moderate joint effusion with significant degenerative extruded lateral meniscus. Verbal informed consent obtained.  Discussed risks and benefits of procedure. Warned about infection, bleeding, hyperglycemia damage to structures among others. Patient expresses understanding and agreement Time-out conducted.    Noted no overlying erythema, induration, or other signs of local infection.   Skin prepped in a sterile fashion.   Local anesthesia: Topical Ethyl chloride.   With sterile technique and under real time ultrasound guidance: 40 mg of Kenalog and 2 mL of Marcaine injected into knee joint. Fluid seen entering the joint capsule.   Completed without difficulty   Pain immediately resolved suggesting accurate placement of the medication.   Advised to call if fevers/chills, erythema, induration, drainage, or persistent bleeding.   Images permanently stored and available for review in the ultrasound unit.  Impression: Technically successful ultrasound guided injection.   X-ray images right knee obtained today personally and independently interpreted Moderate lateral and mild patellofemoral DJD.  No acute fractures are visible. Await formal radiology review    Assessment and Plan: 77 y.o. male with chronic right knee pain due to exacerbation of DJD.  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  Plan for steroid injection today and check back as needed.  Recommend using a walker to ambulate for safety.  Chronic problem with an acute exacerbation. PDMP not reviewed this encounter. Orders Placed This Encounter  Procedures   [place] LIMITED JOINT SPACE STRUCTURES LOW RIGHT(NO LINKED CHARGES)    Reason for Exam (SYMPTOM  OR DIAGNOSIS REQUIRED):   right knee pain    Preferred imaging location?:   [facility] Knee AP/LAT W/Sun[place] Right    Standing Status:   Future    Number of Occurrences:   1    Expiration Date:   05/14/2023    Reason for  Exam (SYMPTOM  OR DIAGNOSIS REQUIRED):   right knee pain    Preferred imaging location?:   [facility]   No orders of the defined types were placed in this encounter.    Discussed warning signs or symptoms. Please see discharge instructions. Patient expresses  understanding.   The above documentation has been reviewed and is accurate and complete Clementeen Graham, M.D.

## 2023-04-14 NOTE — Progress Notes (Signed)
Right knee x-ray shows medium arthritis

## 2023-09-28 ENCOUNTER — Telehealth: Payer: Self-pay | Admitting: Internal Medicine

## 2023-09-28 NOTE — Telephone Encounter (Signed)
 Contacted Curtis Vance to schedule their annual wellness visit. Patient declined to schedule AWV at this time.Transferred care, didn't provide new PCP information.   [facility] Care Guide [facility] AWV TEAM Direct Dial: [phone]

## 2024-07-24 ENCOUNTER — Ambulatory Visit (HOSPITAL_COMMUNITY): Admit: 2024-07-24 | Admitting: Orthopedic Surgery

## 2024-07-24 DIAGNOSIS — M1612 Unilateral primary osteoarthritis, left hip: Secondary | ICD-10-CM
